# Patient Record
Sex: Female | Born: 1935 | Race: White | Hispanic: No | Smoking: Former smoker
Health system: Southern US, Community
[De-identification: ages and names within clinical notes are randomized; demographics above are authoritative.]

## PROBLEM LIST (undated history)

## (undated) DIAGNOSIS — M25519 Pain in unspecified shoulder: Secondary | ICD-10-CM

## (undated) DIAGNOSIS — Q438 Other specified congenital malformations of intestine: Secondary | ICD-10-CM

## (undated) DIAGNOSIS — C649 Malignant neoplasm of unspecified kidney, except renal pelvis: Secondary | ICD-10-CM

## (undated) DIAGNOSIS — Z951 Presence of aortocoronary bypass graft: Secondary | ICD-10-CM

## (undated) DIAGNOSIS — R197 Diarrhea, unspecified: Secondary | ICD-10-CM

## (undated) DIAGNOSIS — J449 Chronic obstructive pulmonary disease, unspecified: Secondary | ICD-10-CM

## (undated) DIAGNOSIS — R55 Syncope and collapse: Secondary | ICD-10-CM

## (undated) DIAGNOSIS — I2510 Atherosclerotic heart disease of native coronary artery without angina pectoris: Secondary | ICD-10-CM

## (undated) DIAGNOSIS — R9430 Abnormal result of cardiovascular function study, unspecified: Secondary | ICD-10-CM

## (undated) DIAGNOSIS — D589 Hereditary hemolytic anemia, unspecified: Secondary | ICD-10-CM

## (undated) DIAGNOSIS — I739 Peripheral vascular disease, unspecified: Secondary | ICD-10-CM

## (undated) DIAGNOSIS — K219 Gastro-esophageal reflux disease without esophagitis: Secondary | ICD-10-CM

## (undated) DIAGNOSIS — I340 Nonrheumatic mitral (valve) insufficiency: Secondary | ICD-10-CM

## (undated) DIAGNOSIS — IMO0002 Reserved for concepts with insufficient information to code with codable children: Secondary | ICD-10-CM

## (undated) DIAGNOSIS — Z79899 Other long term (current) drug therapy: Secondary | ICD-10-CM

## (undated) DIAGNOSIS — I779 Disorder of arteries and arterioles, unspecified: Secondary | ICD-10-CM

## (undated) DIAGNOSIS — Z87898 Personal history of other specified conditions: Secondary | ICD-10-CM

## (undated) DIAGNOSIS — I493 Ventricular premature depolarization: Secondary | ICD-10-CM

## (undated) DIAGNOSIS — N189 Chronic kidney disease, unspecified: Secondary | ICD-10-CM

## (undated) HISTORY — DX: Diarrhea, unspecified: R19.7

## (undated) HISTORY — DX: Nonrheumatic mitral (valve) insufficiency: I34.0

## (undated) HISTORY — DX: Peripheral vascular disease, unspecified: I73.9

## (undated) HISTORY — DX: Chronic kidney disease, unspecified: N18.9

## (undated) HISTORY — DX: Syncope and collapse: R55

## (undated) HISTORY — DX: Gastro-esophageal reflux disease without esophagitis: K21.9

## (undated) HISTORY — DX: Atherosclerotic heart disease of native coronary artery without angina pectoris: I25.10

## (undated) HISTORY — DX: Malignant neoplasm of unspecified kidney, except renal pelvis: C64.9

## (undated) HISTORY — DX: Presence of aortocoronary bypass graft: Z95.1

## (undated) HISTORY — DX: Hereditary hemolytic anemia, unspecified: D58.9

## (undated) HISTORY — PX: RECTAL BIOPSY: SHX2303

## (undated) HISTORY — PX: CORONARY ARTERY BYPASS GRAFT: SHX141

## (undated) HISTORY — PX: SHOULDER SURGERY: SHX246

## (undated) HISTORY — PX: KIDNEY SURGERY: SHX687

## (undated) HISTORY — DX: Abnormal result of cardiovascular function study, unspecified: R94.30

## (undated) HISTORY — DX: Personal history of other specified conditions: Z87.898

## (undated) HISTORY — DX: Other long term (current) drug therapy: Z79.899

## (undated) HISTORY — DX: Ventricular premature depolarization: I49.3

## (undated) HISTORY — PX: ABDOMINAL HYSTERECTOMY: SHX81

## (undated) HISTORY — DX: Reserved for concepts with insufficient information to code with codable children: IMO0002

## (undated) HISTORY — PX: GALLBLADDER SURGERY: SHX652

## (undated) HISTORY — DX: Chronic obstructive pulmonary disease, unspecified: J44.9

## (undated) HISTORY — DX: Pain in unspecified shoulder: M25.519

## (undated) HISTORY — PX: OTHER SURGICAL HISTORY: SHX169

## (undated) HISTORY — DX: Disorder of arteries and arterioles, unspecified: I77.9

---

## 2001-01-20 ENCOUNTER — Encounter: Payer: Self-pay | Admitting: *Deleted

## 2001-01-21 ENCOUNTER — Inpatient Hospital Stay (HOSPITAL_COMMUNITY): Admission: RE | Admit: 2001-01-21 | Discharge: 2001-01-29 | Payer: Self-pay | Admitting: *Deleted

## 2001-01-21 ENCOUNTER — Encounter: Payer: Self-pay | Admitting: Thoracic Surgery (Cardiothoracic Vascular Surgery)

## 2001-01-22 ENCOUNTER — Encounter: Payer: Self-pay | Admitting: Thoracic Surgery (Cardiothoracic Vascular Surgery)

## 2001-01-23 ENCOUNTER — Encounter: Payer: Self-pay | Admitting: Thoracic Surgery (Cardiothoracic Vascular Surgery)

## 2001-01-24 ENCOUNTER — Encounter: Payer: Self-pay | Admitting: Thoracic Surgery (Cardiothoracic Vascular Surgery)

## 2005-09-09 ENCOUNTER — Ambulatory Visit: Payer: Self-pay | Admitting: Cardiology

## 2005-12-08 ENCOUNTER — Ambulatory Visit: Payer: Self-pay

## 2006-11-13 ENCOUNTER — Ambulatory Visit: Payer: Self-pay | Admitting: Cardiology

## 2006-12-07 ENCOUNTER — Ambulatory Visit: Payer: Self-pay | Admitting: Cardiology

## 2007-01-04 ENCOUNTER — Inpatient Hospital Stay (HOSPITAL_COMMUNITY): Admission: EM | Admit: 2007-01-04 | Discharge: 2007-01-12 | Payer: Self-pay | Admitting: Emergency Medicine

## 2007-01-04 ENCOUNTER — Ambulatory Visit: Payer: Self-pay | Admitting: Internal Medicine

## 2007-01-12 ENCOUNTER — Encounter: Payer: Self-pay | Admitting: Internal Medicine

## 2007-03-30 ENCOUNTER — Ambulatory Visit: Payer: Self-pay | Admitting: Cardiology

## 2007-08-17 HISTORY — PX: CARDIAC CATHETERIZATION: SHX172

## 2007-11-21 ENCOUNTER — Encounter: Admission: RE | Admit: 2007-11-21 | Discharge: 2007-11-21 | Payer: Self-pay | Admitting: Gastroenterology

## 2008-01-09 ENCOUNTER — Ambulatory Visit: Payer: Self-pay | Admitting: Cardiology

## 2008-06-28 ENCOUNTER — Ambulatory Visit: Payer: Self-pay | Admitting: Cardiology

## 2008-06-28 ENCOUNTER — Inpatient Hospital Stay (HOSPITAL_COMMUNITY): Admission: AD | Admit: 2008-06-28 | Discharge: 2008-07-02 | Payer: Self-pay | Admitting: Internal Medicine

## 2008-06-28 ENCOUNTER — Encounter: Payer: Self-pay | Admitting: Cardiology

## 2008-07-21 ENCOUNTER — Inpatient Hospital Stay (HOSPITAL_COMMUNITY): Admission: EM | Admit: 2008-07-21 | Discharge: 2008-07-23 | Payer: Self-pay | Admitting: Emergency Medicine

## 2008-07-21 ENCOUNTER — Ambulatory Visit: Payer: Self-pay | Admitting: Internal Medicine

## 2008-07-26 ENCOUNTER — Encounter: Payer: Self-pay | Admitting: Cardiology

## 2008-07-29 ENCOUNTER — Encounter: Payer: Self-pay | Admitting: Cardiology

## 2008-08-22 ENCOUNTER — Encounter: Payer: Self-pay | Admitting: Cardiology

## 2008-08-23 ENCOUNTER — Ambulatory Visit: Payer: Self-pay | Admitting: Cardiology

## 2008-08-27 ENCOUNTER — Ambulatory Visit: Payer: Self-pay | Admitting: Cardiology

## 2008-08-27 ENCOUNTER — Encounter: Payer: Self-pay | Admitting: Cardiology

## 2008-08-28 ENCOUNTER — Encounter: Payer: Self-pay | Admitting: Cardiology

## 2008-11-20 ENCOUNTER — Encounter: Payer: Self-pay | Admitting: Cardiology

## 2008-12-10 ENCOUNTER — Encounter: Payer: Self-pay | Admitting: Cardiology

## 2009-01-22 ENCOUNTER — Encounter: Payer: Self-pay | Admitting: Cardiology

## 2009-01-22 ENCOUNTER — Ambulatory Visit (HOSPITAL_COMMUNITY): Admission: RE | Admit: 2009-01-22 | Discharge: 2009-01-22 | Payer: Self-pay | Admitting: Urology

## 2009-01-28 ENCOUNTER — Ambulatory Visit: Payer: Self-pay | Admitting: Cardiology

## 2009-02-03 ENCOUNTER — Encounter: Payer: Self-pay | Admitting: Cardiology

## 2009-02-07 ENCOUNTER — Ambulatory Visit (HOSPITAL_COMMUNITY): Admission: RE | Admit: 2009-02-07 | Discharge: 2009-02-07 | Payer: Self-pay | Admitting: Urology

## 2009-02-07 ENCOUNTER — Encounter (INDEPENDENT_AMBULATORY_CARE_PROVIDER_SITE_OTHER): Payer: Self-pay | Admitting: Interventional Radiology

## 2009-03-31 ENCOUNTER — Inpatient Hospital Stay (HOSPITAL_COMMUNITY): Admission: RE | Admit: 2009-03-31 | Discharge: 2009-04-04 | Payer: Self-pay | Admitting: Urology

## 2009-10-02 ENCOUNTER — Encounter: Payer: Self-pay | Admitting: Cardiology

## 2009-12-12 ENCOUNTER — Encounter: Payer: Self-pay | Admitting: Cardiology

## 2010-01-01 ENCOUNTER — Encounter: Payer: Self-pay | Admitting: Cardiology

## 2010-01-06 ENCOUNTER — Encounter: Payer: Self-pay | Admitting: Cardiology

## 2010-01-06 DIAGNOSIS — I6529 Occlusion and stenosis of unspecified carotid artery: Secondary | ICD-10-CM

## 2010-01-06 DIAGNOSIS — K219 Gastro-esophageal reflux disease without esophagitis: Secondary | ICD-10-CM

## 2010-01-07 ENCOUNTER — Ambulatory Visit: Payer: Self-pay | Admitting: Cardiology

## 2010-01-16 ENCOUNTER — Encounter: Payer: Self-pay | Admitting: Cardiology

## 2010-01-19 ENCOUNTER — Ambulatory Visit: Payer: Self-pay | Admitting: Cardiology

## 2010-02-06 ENCOUNTER — Encounter: Payer: Self-pay | Admitting: Cardiology

## 2010-03-09 ENCOUNTER — Encounter: Payer: Self-pay | Admitting: Cardiology

## 2010-03-26 ENCOUNTER — Encounter: Payer: Self-pay | Admitting: Cardiology

## 2010-03-31 ENCOUNTER — Ambulatory Visit (HOSPITAL_COMMUNITY): Admission: RE | Admit: 2010-03-31 | Discharge: 2010-03-31 | Payer: Self-pay | Admitting: Urology

## 2010-05-19 ENCOUNTER — Ambulatory Visit: Payer: Self-pay | Admitting: Cardiology

## 2010-09-15 NOTE — Assessment & Plan Note (Signed)
Summary: 2 WK F/U PER 5/25 OV-JM   Visit Type:  Follow-up Primary Provider:  Weldon Picking Shah,MD  CC:  CAD.  History of Present Illness: The patient is seen for cardiology followup.  I saw her Jan 07, 2010.  Medicine doses were decreased because of syncope and the possibility of some orthostasis and volume depletion.  She's done better.  Her meds were further adjusted by Dr.Shah  recently with decrease of her metoprolol dose.  He has some mild dizziness today but overall is actually quite stable.  Preventive Screening-Counseling & Management  Alcohol-Tobacco     Smoking Status: quit     Year Quit: 1999  Current Medications (verified): 1)  Potassium Chloride Crys Cr 20 Meq Cr-Tabs (Potassium Chloride Crys Cr) .... Take 1 Tablet By Mouth Two Times A Day 2)  Prednisone 5 Mg Tabs (Prednisone) .... Take 1 Tablet By Mouth Once A Day 3)  Simvastatin 40 Mg Tabs (Simvastatin) .... Take 1 Tab By Mouth At Bedtime 4)  Isosorbide Mononitrate Cr 60 Mg Xr24h-Tab (Isosorbide Mononitrate) .... Take One Tablet By Mouth Daily Hold 5)  Metoprolol Tartrate 50 Mg Tabs (Metoprolol Tartrate) .... Take 1/2 Tablet By Mouth Daily Per Dr. Manuella Ghazi 6)  Alendronate Sodium 70 Mg Tabs (Alendronate Sodium) .... Take One By Mouth Weekly 7)  Prilosec Otc 20 Mg Tbec (Omeprazole Magnesium) .... Take 1 Tablet By Mouth Once A Day 8)  Furosemide 40 Mg Tabs (Furosemide) .... Take 1 Tablet By Mouth Every Morning and 1/2 Tablets By Mouth Every Evening 9)  Sertraline Hcl 50 Mg Tabs (Sertraline Hcl) .... Take 1 Tablet By Mouth Once A Day 10)  Allopurinol 100 Mg Tabs (Allopurinol) .... Take 1 Tablet By Mouth Once A Day 11)  Aspir-Low 81 Mg Tbec (Aspirin) .... Take 1 Tablet By Mouth Once A Day 12)  Drisdol 50000 Unit Caps (Ergocalciferol) .... Take One By Mouth Weekly 13)  Nitrostat 0.4 Mg Subl (Nitroglycerin) .... Use As Directed 14)  Spiriva Handihaler 18 Mcg Caps (Tiotropium Bromide Monohydrate) .... One Inhalation Daily  Allergies  (verified): 1)  ! Codeine  Comments:  Nurse/Medical Assistant: The patient's medication list and allergies were reviewed with the patient and were updated in the Medication and Allergy Lists.   Past History:  Past Medical History: COPD   home O2 Hemolytic anemia    Dr.Darovsky... probable Coombs negative hemolytic anemia... some prednisone therapy Prednisone therapy.... intermittent for anemia Osteoporosis Gout. PVD (ICD-443.9)  legs... mild GERD Arthritis PVCs Renal cell mass... followed over time Pneumonia.... severe was shocked at 2008 Diarrhea.... recurrent Clostridium difficile in the past   Dr.Magod Prolonged QT interval intermittent Left shoulder pain... chronic rotator cuff... Mitral regurgitation.... mild CAD   catheterization November, 2009...Marland Kitchen 2 of 4 grafts patent... normal ejection fraction Ejection fraction... normal... catheterization.... 2009 CABG   2002 Syncope.... January, 2010.Marland Kitchen.. probable orthostatic hypotension and volume /  [facility].. Dec 18, 2009 normal sinus rhythm.. Carotid artery disease... Doppler... January, 2010 mild plaque, no major stenosis... vertebral flow antegrade    Review of Systems       Patient denies fever, chills, headache, sweats, rash, change in vision, change in hearing, chest pain, cough, nausea vomiting, urinary symptoms.  All of the systems are reviewed and are negative other than the history of present illness  Vital Signs:  Patient profile:   76 year old female Height:      63 inches Weight:      113 pounds Pulse rate:   71 / minute BP sitting:  160 / 73  (left arm) Cuff size:   regular  Vitals Entered By: Georgina Peer (January 19, 2010 9:40 AM)  Serial Vital Signs/Assessments:  Time      Position  BP       Pulse  Resp  Temp     By 9:41 AM   R Arm     147/71                         Georgina Peer  Comments: 9:41 AM HR-66 By: Georgina Peer    Physical Exam  General:  patient is quite stable today. Eyes:  no  xanthelasma. Neck:  soft left carotid bruit. Lungs:  lungs are clear.  Respiratory effort is nonlabored. Heart:  cardiac exam reveals S1-S2.  No clicks or significant murmurs Abdomen:  abdomen is soft. Extremities:  no peripheral edema. Psych:  patient is oriented to person time and place.  Affect is normal.   Impression & Recommendations:  Problem # 1:  CAROTID ARTERY DISEASE (ICD-433.10)  Her updated medication list for this problem includes:    Aspir-low 81 Mg Tbec (Aspirin) .Marland Kitchen... Take 1 tablet by mouth once a day We know she has mild disease.  Followup Dopplers not needed at this time.  Problem # 2:  SYNCOPE (ICD-780.2)  Her updated medication list for this problem includes:    Prednisone 5 Mg Tabs (Prednisone) .Marland Kitchen... Take 1 tablet by mouth once a day    Isosorbide Mononitrate Cr 60 Mg Xr24h-tab (Isosorbide mononitrate) .Marland Kitchen... Take one tablet by mouth daily hold    Metoprolol Tartrate 50 Mg Tabs (Metoprolol tartrate) .Marland Kitchen... Take 1/2 tablet by mouth daily per dr. Manuella Ghazi    Aspir-low 81 Mg Tbec (Aspirin) .Marland Kitchen... Take 1 tablet by mouth once a day    Nitrostat 0.4 Mg Subl (Nitroglycerin) ..... Use as directed Patient has had no recurrent syncope.  Keeping her meds reduced he is appropriate.  Her systolic pressure is slightly elevated today.  Diastolic is 73.  I feel we should not adjust her medicines. I had obtained a copy of the report of the patient's 21 day monitor.  The data is recorded in the past medical history.  She had sinus rhythm.  I reviewed that report.  Problem # 3:  CAD (ICD-414.00)  Her updated medication list for this problem includes:    Isosorbide Mononitrate Cr 60 Mg Xr24h-tab (Isosorbide mononitrate) .Marland Kitchen... Take one tablet by mouth daily hold    Metoprolol Tartrate 50 Mg Tabs (Metoprolol tartrate) .Marland Kitchen... Take 1/2 tablet by mouth daily per dr. Manuella Ghazi    Aspir-low 81 Mg Tbec (Aspirin) .Marland Kitchen... Take 1 tablet by mouth once a day    Nitrostat 0.4 Mg Subl (Nitroglycerin) ..... Use  as directed Coronary disease is stable.  No change in therapy.  6 month followup.  Patient Instructions: 1)  Your physician wants you to follow-up in: 6 months. You will receive a reminder letter in the mail one-two months in advance. If you don't receive a letter, please call our office to schedule the follow-up appointment. 2)  Your physician recommends that you continue on your current medications as directed. Please refer to the Current Medication list given to you today.

## 2010-09-15 NOTE — Letter (Signed)
Summary: External Correspondence/ OFFICE VISIT [facility]  External Correspondence/ OFFICE VISIT [facility]   Imported By: Bartholomew Boards 03/30/2010 16:28:22  _____________________________________________________________________  External Attachment:    Type:   Image     Comment:   External Document

## 2010-09-15 NOTE — Miscellaneous (Signed)
  Clinical Lists Changes  Observations: Added new observation of PAST MED HX: COPD   home O2 Hemolytic anemia    Dr.Darovsky... probable Coombs negative hemolytic anemia... some prednisone therapy Prednisone therapy.... intermittent for anemia Osteoporosis Gout. PVD (ICD-443.9)  legs... mild GERD Arthritis PVCs Renal cell mass... followed over time Pneumonia.... severe was shocked at 2008 Diarrhea.... recurrent Clostridium difficile in the past   Dr.Magod Prolonged QT interval intermittent Left shoulder pain... chronic rotator cuff... Mitral regurgitation.... mild CAD   catheterization November, 2009...Marland Kitchen 2 of 4 grafts patent... normal ejection fraction Ejection fraction... normal... catheterization.... 2009 CABG   2002 Syncope.... January, 2010.Marland Kitchen.. probable orthostatic hypotension and volume /  [facility].. Dec 18, 2009 normal sinus rhythm Carotid artery disease... Doppler... January, 2010 mild plaque, no major stenosis... vertebral flow antegrade   (01/16/2010 17:48) Added new observation of PRIMARY MD: Ashish Shah,MD (01/16/2010 17:48)       Past History:  Past Medical History: COPD   home O2 Hemolytic anemia    Dr.Darovsky... probable Coombs negative hemolytic anemia... some prednisone therapy Prednisone therapy.... intermittent for anemia Osteoporosis Gout. PVD (ICD-443.9)  legs... mild GERD Arthritis PVCs Renal cell mass... followed over time Pneumonia.... severe was shocked at 2008 Diarrhea.... recurrent Clostridium difficile in the past   Dr.Magod Prolonged QT interval intermittent Left shoulder pain... chronic rotator cuff... Mitral regurgitation.... mild CAD   catheterization November, 2009...Marland Kitchen 2 of 4 grafts patent... normal ejection fraction Ejection fraction... normal... catheterization.... 2009 CABG   2002 Syncope.... January, 2010.Marland Kitchen.. probable orthostatic hypotension and volume /  [facility].. Dec 18, 2009 normal sinus rhythm Carotid artery disease...  Doppler... January, 2010 mild plaque, no major stenosis... vertebral flow antegrade

## 2010-09-15 NOTE — Medication Information (Signed)
Summary: RX Folder/ [facility]  RX Folder/ [facility]   Imported By: Bartholomew Boards 03/30/2010 16:38:24  _____________________________________________________________________  External Attachment:    Type:   Image     Comment:   External Document

## 2010-09-15 NOTE — Letter (Signed)
Summary: External Correspondence/ OFFICE VISIT [facility]  External Correspondence/ OFFICE VISIT [facility]   Imported By: Bartholomew Boards 03/30/2010 16:29:35  _____________________________________________________________________  External Attachment:    Type:   Image     Comment:   External Document

## 2010-09-15 NOTE — Assessment & Plan Note (Signed)
Summary: ROV-DR. [facility] REQUESTED APPT DUE   Visit Type:  Follow-up Primary Provider:  Weldon Picking Shah,MD  CC:  syncope.  History of Present Illness: The patient is seen for followup of syncope.  She is doing well.  Her metoprolol dose was decreased by Dr.Shah, and she is doing very well with this.  It seems that she did have an orthostatic component to her blood pressure.  Today she is not having any chest pain or dizziness.  Preventive Screening-Counseling & Management  Alcohol-Tobacco     Smoking Status: quit     Year Quit: 1999  Current Medications (verified): 1)  Potassium Chloride Crys Cr 20 Meq Cr-Tabs (Potassium Chloride Crys Cr) .... Take 1 Tablet By Mouth Two Times A Day 2)  Prednisone 5 Mg Tabs (Prednisone) .... Take 1 Tablet By Mouth Once A Day 3)  Simvastatin 40 Mg Tabs (Simvastatin) .... Take 1 Tab By Mouth At Bedtime 4)  Metoprolol Tartrate 25 Mg Tabs (Metoprolol Tartrate) .... Take 1/2 Tablet By Mouth Once A Day 5)  Prilosec Otc 20 Mg Tbec (Omeprazole Magnesium) .... Take 1 Tablet By Mouth Once A Day 6)  Furosemide 40 Mg Tabs (Furosemide) .... Take 1 Tablet By Mouth Every Morning and 1/2 Tablets By Mouth Every Evening 7)  Sertraline Hcl 50 Mg Tabs (Sertraline Hcl) .... Take 1 Tablet By Mouth Once A Day 8)  Aspir-Low 81 Mg Tbec (Aspirin) .... Take 1 Tablet By Mouth Every Other Day 9)  Nitrostat 0.4 Mg Subl (Nitroglycerin) .... Use As Directed 10)  Spiriva Handihaler 18 Mcg Caps (Tiotropium Bromide Monohydrate) .... One Inhalation Daily 11)  Tart Cherry Advanced  Caps (Misc Natural Products) .... Take 1 Tablet By Mouth Once A Day  Allergies (verified): 1)  ! Codeine  Comments:  Nurse/Medical Assistant: The patient's medication list and allergies were reviewed with the patient and were updated in the Medication and Allergy Lists.  Past History:  Past Medical History: COPD   home O2 Hemolytic anemia    Dr.Darovsky... probable Coombs negative hemolytic anemia... some  prednisone therapy Prednisone therapy.... intermittent for anemia Osteoporosis Gout. PVD (ICD-443.9)  legs... mild GERD Arthritis PVCs Renal cell mass... followed over time Pneumonia.... severe was shocked at 2008 Diarrhea.... recurrent Clostridium difficile in the past   Dr.Magod Prolonged QT interval intermittent Left shoulder pain... chronic rotator cuff... Mitral regurgitation.... mild CAD   catheterization November, 2009...Marland Kitchen 2 of 4 grafts patent... normal ejection fraction Ejection fraction... normal... catheterization.... 2009 CABG   2002 Syncope.... January, 2010.Marland Kitchen.. probable orthostatic hypotension and volume /  [facility].. Dec 18, 2009 normal sinus rhythm.. Carotid artery disease... Doppler... January, 2010 mild plaque, no major stenosis... vertebral flow antegrade    Review of Systems       Patient denies fever, chills, headache, sweats, rash, change in vision, change in hearing, chest pain, cough, nausea vomiting, urinary symptoms.  All other systems are reviewed and are negative.  Vital Signs:  Patient profile:   76 year old female Height:      63 inches Weight:      113 pounds Pulse rate:   73 / minute BP sitting:   126 / 72  (left arm) Cuff size:   regular  Vitals Entered By: Georgina Peer (May 19, 2010 9:24 AM) CC: syncope   Physical Exam  General:  patient looks good today. Eyes:  no xanthelasma. Neck:  no jugular venous distention. Lungs:  lungs are clear.  Respiratory effort is nonlabored. Heart:  cardiac exam reveals S1-S2.  There is no clicks or significant murmurs. Abdomen:  abdomen is soft. Extremities:  no peripheral edema. Psych:  patient is oriented to person time and place.  Affect is normal.   Impression & Recommendations:  Problem # 1:  CAROTID ARTERY DISEASE (ICD-433.10)  Her updated medication list for this problem includes:    Aspir-low 81 Mg Tbec (Aspirin) .Marland Kitchen... Take 1 tablet by mouth every other day She has mild carotid  disease.  No further workup.  Problem # 2:  SYNCOPE (ICD-780.2)  The following medications were removed from the medication list:    Isosorbide Mononitrate Cr 60 Mg Xr24h-tab (Isosorbide mononitrate) .Marland Kitchen... Take one tablet by mouth daily hold Her updated medication list for this problem includes:    Prednisone 5 Mg Tabs (Prednisone) .Marland Kitchen... Take 1 tablet by mouth once a day    Metoprolol Tartrate 25 Mg Tabs (Metoprolol tartrate) .Marland Kitchen... Take 1/2 tablet by mouth once a day    Aspir-low 81 Mg Tbec (Aspirin) .Marland Kitchen... Take 1 tablet by mouth every other day    Nitrostat 0.4 Mg Subl (Nitroglycerin) ..... Use as directed There has been no syncope.  Her metoprolol dose has been lower than she is stable with this.  Problem # 3:  CAD (ICD-414.00)  The following medications were removed from the medication list:    Isosorbide Mononitrate Cr 60 Mg Xr24h-tab (Isosorbide mononitrate) .Marland Kitchen... Take one tablet by mouth daily hold Her updated medication list for this problem includes:    Metoprolol Tartrate 25 Mg Tabs (Metoprolol tartrate) .Marland Kitchen... Take 1/2 tablet by mouth once a day    Aspir-low 81 Mg Tbec (Aspirin) .Marland Kitchen... Take 1 tablet by mouth every other day    Nitrostat 0.4 Mg Subl (Nitroglycerin) ..... Use as directed Coronary disease is stable.  No further workup.  Patient Instructions: 1)  Your physician wants you to follow-up in: 6 months. You will receive a reminder letter in the mail one-two months in advance. If you don't receive a letter, please call our office to schedule the follow-up appointment. 2)  Your physician recommends that you continue on your current medications as directed. Please refer to the Current Medication list given to you today.

## 2010-09-15 NOTE — Assessment & Plan Note (Signed)
Summary: 6 month fu -recvv letter vs   Visit Type:  Follow-up Primary Provider:  Weldon Picking Shah,MD  CC:  dizziness.  History of Present Illness: Patient is seen for cardiology followup.  I saw her last June, 2010.  She had an episode of syncope in January, 2010.  We thought that this was probably related to orthostasis and mild volume depletion.  She has not had any recurrent syncope.  Her blood pressure is low at home at times and she feels poorly with this.  She is more a monitor through Dr.Shah's office.  I do not have the result yet.  She's not having any significant chest pain.  She is on furosemide 40 b.i.d.  She says that she has some swelling and she does not take this dose.  She is on low-dose prednisone.  Preventive Screening-Counseling & Management  Alcohol-Tobacco     Smoking Status: quit     Year Quit: 1999  Current Medications (verified): 1)  Potassium Chloride Crys Cr 20 Meq Cr-Tabs (Potassium Chloride Crys Cr) .... Take 1 Tablet By Mouth Two Times A Day 2)  Prednisone 5 Mg Tabs (Prednisone) .... Take 1 Tablet By Mouth Once A Day 3)  Simvastatin 40 Mg Tabs (Simvastatin) .... Take 1 Tab By Mouth At Bedtime 4)  Isosorbide Mononitrate Cr 60 Mg Xr24h-Tab (Isosorbide Mononitrate) .... Take One Tablet By Mouth Daily 5)  Metoprolol Tartrate 50 Mg Tabs (Metoprolol Tartrate) .... Take 1/2 Tablet By Mouth Two Times A Day 6)  Alendronate Sodium 70 Mg Tabs (Alendronate Sodium) .... Take One By Mouth Weekly 7)  Prilosec Otc 20 Mg Tbec (Omeprazole Magnesium) .... Take 1 Tablet By Mouth Once A Day 8)  Furosemide 40 Mg Tabs (Furosemide) .... Take 1 Tablet By Mouth Two Times A Day 9)  Sertraline Hcl 50 Mg Tabs (Sertraline Hcl) .... Take 1 Tablet By Mouth Once A Day 10)  Allopurinol 100 Mg Tabs (Allopurinol) .... Take 1 Tablet By Mouth Once A Day 11)  Aspir-Low 81 Mg Tbec (Aspirin) .... Take 1 Tablet By Mouth Once A Day 12)  Drisdol 50000 Unit Caps (Ergocalciferol) .... Take One By Mouth  Weekly 13)  Nitrostat 0.4 Mg Subl (Nitroglycerin) .... Use As Directed 14)  Spiriva Handihaler 18 Mcg Caps (Tiotropium Bromide Monohydrate) .... One Inhalation Daily  Allergies (verified): 1)  ! Codeine  Comments:  Nurse/Medical Assistant: The patient's medications and allergies were reviewed with the patient and were updated in the Medication and Allergy Lists. List reviewed.  Past History:  Past Medical History: Last updated: 01/06/2010 COPD   home O2 Hemolytic anemia    Dr.Darovsky... probable Coombs negative hemolytic anemia... some prednisone therapy Prednisone therapy.... intermittent for anemia Osteoporosis Gout. PVD (ICD-443.9)  legs... mild GERD Arthritis PVCs Renal cell mass... followed over time Pneumonia.... severe was shocked at 2008 Diarrhea.... recurrent Clostridium difficile in the past   Dr.Magod Prolonged QT interval intermittent Left shoulder pain... chronic rotator cuff... Mitral regurgitation.... mild CAD   catheterization November, 2009...Marland Kitchen 2 of 4 grafts patent... normal ejection fraction Ejection fraction... normal... catheterization.... 2009 CABG   2002 Syncope.... January, 2010.Marland Kitchen.. probable orthostatic hypotension and volume Carotid artery disease... Doppler... January, 2010 mild plaque, no major stenosis... vertebral flow antegrade    Review of Systems       Patient denies fever, chills, headache, sweats, rash, change in vision, change in hearing, chest pain, cough, shortness of breath, nausea or vomiting, urinary symptoms, musculoskeletal problems.  All other systems are reviewed and are negative.  Vital Signs:  Patient profile:   76 year old female Height:      63 inches Weight:      111 pounds BMI:     19.73 Pulse rate:   68 / minute BP sitting:   122 / 67  (left arm) Cuff size:   regular  Vitals Entered By: Georgina Peer (Jan 07, 2010 12:23 PM) CC: dizziness   Physical Exam  General:  patient is quite stable today. Head:  head  is atraumatic. Eyes:  no xanthelasma. Neck:  no jugular distention. Chest Wall:  no chest wall tenderness. Lungs:  lungs are clear.  Respiratory effort is nonlabored. Heart:  cardiac exam reveals S1-S2.  No clicks or significant murmurs. Abdomen:  abdomen is soft. Msk:  no musculoskeletal deformities. Extremities:  no peripheral edema Skin:  nose in rashes.  She does have a few areas of ecchymoses on her arms. Psych:  patient is oriented to person time and place.  Affect is normal.  She is here with her husband.   Impression & Recommendations:  Problem # 1:  CAROTID ARTERY DISEASE (ICD-433.10)  Her updated medication list for this problem includes:    Aspir-low 81 Mg Tbec (Aspirin) .Marland Kitchen... Take 1 tablet by mouth once a day The patient has mild carotid artery disease.  We know she had only mild plaque in January, 2010.  Followup study is not needed very  Problem # 2:  SYNCOPE (ICD-780.2) The patient had syncope in the past.  It was felt to be volume and orthostasis.  She is now having intermittent problems with low blood pressure.  She feels poorly with this but has not had syncope.  EKG is done and reviewed by me.  The tracing is normal.  There is normal sinus rhythm.  I've chosen to decrease her Lasix dose slightly to 40 in the morning and 20 in the evening.  I put her isosorbide on hold.  I will see her back for early followup.  I will obtain monitor that she wore through Dr.Shah's office.  Problem # 3:  MITRAL REGURGITATION (ICD-396.3) Mitral regurgitation has been mild in the past.  She does not need followup echo.  Problem # 4:  * PREDNISONE THERAPY Patient is on low-dose prednisone.  Problem # 5:  * HEMOLYTIC ANEMIA The patient tells me that her anemia is under control.  No further workup by me at this time.  Problem # 6:  CAD (ICD-414.00) Coronary disease is stable.  I do not believe that her dizziness is related to ischemia.  Catheterization was done in 2009 showing a two over  four grafts are patent.  I will see her for early followup.  I will decide over time if exercise testing needs to be done.  Other Orders: EKG w/ Interpretation (93000)  Patient Instructions: 1)  Hold Imdur (isosorbide) for now. 2)  Decrease Lasix (furosemide) to 40mg  every morning and 1/2 tablet (20mg ) every evening. 3)  RETURN FOR OFFICE VISIT WITH DR. Salena Ortlieb ON [address] AT 9:45AM

## 2010-09-15 NOTE — Procedures (Signed)
Summary: [facility]) Final Summary Dr. Daphene [facility]) Final Summary Dr. Manuella Ghazi   Imported By: Gurney Maxin, RN, BSN 01/07/2010 15:15:17  _____________________________________________________________________  External Attachment:    Type:   Image     Comment:   External Document

## 2010-09-15 NOTE — Miscellaneous (Signed)
  Clinical Lists Changes  Problems: Added new problem of CAD (ICD-414.00) Removed problem of CAD, ARTERY BYPASS GRAFT (ICD-414.04) Added new problem of CORONARY ARTERY BYPASS GRAFT, HX OF (ICD-V45.81) Added new problem of * HEMOLYTIC ANEMIA Added new problem of * PREDNISONE THERAPY Added new problem of PVD (ICD-443.9) Added new problem of GERD (ICD-530.81) Added new problem of * PVCS Added new problem of * RENAL CELL MASS Added new problem of * PROLONGED QT Added new problem of MITRAL REGURGITATION (ICD-396.3) Added new problem of SYNCOPE (ICD-780.2) Added new problem of CAROTID ARTERY DISEASE (ICD-433.10) Observations: Added new observation of PAST MED HX: COPD   home O2 Hemolytic anemia    Dr.Darovsky... probable Coombs negative hemolytic anemia... some prednisone therapy Prednisone therapy.... intermittent for anemia Osteoporosis Gout. PVD (ICD-443.9)  legs... mild GERD Arthritis PVCs Renal cell mass... followed over time Pneumonia.... severe was shocked at 2008 Diarrhea.... recurrent Clostridium difficile in the past   Dr.Magod Prolonged QT interval intermittent Left shoulder pain... chronic rotator cuff... Mitral regurgitation.... mild CAD   catheterization November, 2009...Marland Kitchen 2 of 4 grafts patent... normal ejection fraction Ejection fraction... normal... catheterization.... 2009 CABG   2002 Syncope.... January, 2010.Marland Kitchen.. probable orthostatic hypotension and volume Carotid artery disease... Doppler... January, 2010 mild plaque, no major stenosis... vertebral flow antegrade   (01/06/2010 13:37) Added new observation of PRIMARY MD: Manuella Ghazi (01/06/2010 13:37)       Past History:  Past Medical History: COPD   home O2 Hemolytic anemia    Dr.Darovsky... probable Coombs negative hemolytic anemia... some prednisone therapy Prednisone therapy.... intermittent for anemia Osteoporosis Gout. PVD (ICD-443.9)  legs... mild GERD Arthritis PVCs Renal cell mass... followed  over time Pneumonia.... severe was shocked at 2008 Diarrhea.... recurrent Clostridium difficile in the past   Dr.Magod Prolonged QT interval intermittent Left shoulder pain... chronic rotator cuff... Mitral regurgitation.... mild CAD   catheterization November, 2009...Marland Kitchen 2 of 4 grafts patent... normal ejection fraction Ejection fraction... normal... catheterization.... 2009 CABG   2002 Syncope.... January, 2010.Marland Kitchen.. probable orthostatic hypotension and volume Carotid artery disease... Doppler... January, 2010 mild plaque, no major stenosis... vertebral flow antegrade

## 2010-09-15 NOTE — Letter (Signed)
Summary: External Correspondence/ OFFICE VISIT [facility]  External Correspondence/ OFFICE VISIT [facility]   Imported By: Bartholomew Boards 03/30/2010 16:30:51  _____________________________________________________________________  External Attachment:    Type:   Image     Comment:   External Document

## 2010-09-15 NOTE — Letter (Signed)
Summary: External Correspondence/ OFFICE VISIT [facility]  External Correspondence/ OFFICE VISIT [facility]   Imported By: Bartholomew Boards 03/30/2010 16:33:34  _____________________________________________________________________  External Attachment:    Type:   Image     Comment:   External Document

## 2010-10-01 ENCOUNTER — Other Ambulatory Visit (HOSPITAL_COMMUNITY): Payer: Self-pay | Admitting: Urology

## 2010-10-01 ENCOUNTER — Ambulatory Visit (HOSPITAL_COMMUNITY)
Admission: RE | Admit: 2010-10-01 | Discharge: 2010-10-01 | Disposition: A | Discharge status: Home / Self Care | Payer: Medicare Other | Source: Ambulatory Visit | Attending: Urology | Admitting: Urology

## 2010-10-01 DIAGNOSIS — I10 Essential (primary) hypertension: Secondary | ICD-10-CM | POA: Insufficient documentation

## 2010-10-01 DIAGNOSIS — Z01818 Encounter for other preprocedural examination: Secondary | ICD-10-CM | POA: Insufficient documentation

## 2010-10-01 DIAGNOSIS — C649 Malignant neoplasm of unspecified kidney, except renal pelvis: Secondary | ICD-10-CM | POA: Insufficient documentation

## 2010-11-16 ENCOUNTER — Other Ambulatory Visit: Payer: Self-pay | Admitting: *Deleted

## 2010-11-16 ENCOUNTER — Other Ambulatory Visit: Payer: Self-pay | Admitting: Cardiology

## 2010-11-16 NOTE — Telephone Encounter (Signed)
Refill done already by [facility] office

## 2010-11-21 LAB — BASIC METABOLIC PANEL
BUN: 11 mg/dL (ref 6–23)
CO2: 24 mEq/L (ref 19–32)
CO2: 27 mEq/L (ref 19–32)
CO2: 27 mEq/L (ref 19–32)
Calcium: 8 mg/dL — ABNORMAL LOW (ref 8.4–10.5)
Calcium: 8.3 mg/dL — ABNORMAL LOW (ref 8.4–10.5)
Calcium: 9.6 mg/dL (ref 8.4–10.5)
Chloride: 100 mEq/L (ref 96–112)
Chloride: 98 mEq/L (ref 96–112)
Creatinine, Ser: 1.19 mg/dL (ref 0.4–1.2)
Creatinine, Ser: 1.27 mg/dL — ABNORMAL HIGH (ref 0.4–1.2)
Creatinine, Ser: 1.58 mg/dL — ABNORMAL HIGH (ref 0.4–1.2)
Creatinine, Ser: 1.64 mg/dL — ABNORMAL HIGH (ref 0.4–1.2)
GFR calc Af Amer: 37 mL/min — ABNORMAL LOW (ref >60)
GFR calc Af Amer: 39 mL/min — ABNORMAL LOW (ref >60)
GFR calc non Af Amer: 45 mL/min — ABNORMAL LOW (ref >60)
GFR calc non Af Amer: 52 mL/min — ABNORMAL LOW (ref >60)
Glucose, Bld: 118 mg/dL — ABNORMAL HIGH (ref 70–99)
Glucose, Bld: 143 mg/dL — ABNORMAL HIGH (ref 70–99)
Potassium: 4.6 mEq/L (ref 3.5–5.1)
Potassium: 4.6 mEq/L (ref 3.5–5.1)
Sodium: 136 mEq/L (ref 135–145)
Sodium: 141 mEq/L (ref 135–145)

## 2010-11-21 LAB — HEMOGLOBIN AND HEMATOCRIT, BLOOD
HCT: 38 % (ref 36.0–46.0)
Hemoglobin: 10.3 g/dL — ABNORMAL LOW (ref 12.0–15.0)
Hemoglobin: 13 g/dL (ref 12.0–15.0)

## 2010-11-21 LAB — TYPE AND SCREEN: Antibody Screen: POSITIVE

## 2010-11-21 LAB — DIFFERENTIAL
Basophils Relative: 2 % — ABNORMAL HIGH (ref 0–1)
Eosinophils Absolute: 0.4 10*3/uL (ref 0.0–0.7)
Eosinophils Relative: 5 % (ref 0–5)
Monocytes Absolute: 0.6 10*3/uL (ref 0.1–1.0)
Monocytes Relative: 8 % (ref 3–12)
Neutrophils Relative %: 61 % (ref 43–77)

## 2010-11-21 LAB — CBC
HCT: 29.5 % — ABNORMAL LOW (ref 36.0–46.0)
HCT: 38.5 % (ref 36.0–46.0)
Hemoglobin: 10.1 g/dL — ABNORMAL LOW (ref 12.0–15.0)
Hemoglobin: 12.9 g/dL (ref 12.0–15.0)
MCHC: 34.3 g/dL (ref 30.0–36.0)
MCHC: 34.8 g/dL (ref 30.0–36.0)
MCV: 92.3 fL (ref 78.0–100.0)
Platelets: 224 10*3/uL (ref 150–400)
Platelets: 232 10*3/uL (ref 150–400)
RBC: 3.2 MIL/uL — ABNORMAL LOW (ref 3.87–5.11)
RDW: 12.9 % (ref 11.5–15.5)
WBC: 8.5 10*3/uL (ref 4.0–10.5)

## 2010-11-23 LAB — CBC
HCT: 38.3 % (ref 36.0–46.0)
Platelets: 232 10*3/uL (ref 150–400)
RDW: 14 % (ref 11.5–15.5)
WBC: 6.2 10*3/uL (ref 4.0–10.5)

## 2010-11-23 LAB — PROTIME-INR: INR: 0.9 (ref 0.00–1.49)

## 2010-11-23 LAB — APTT: aPTT: 27 seconds (ref 24–37)

## 2010-12-29 NOTE — Assessment & Plan Note (Signed)
[place] OFFICE NOTE   NAME:[name], [name] MICHELS                      MRN:          [id number]  DATE:01/09/2008                            DOB:          Sep 04, 1934    [name] is seen for cardiology followup.  She continues to look well.  When I had seen her last in August 2008, she was status post several  difficult medical problems.  She had had a life-threatening pneumonia  with septic shock and requiring ventilation in April 2008.  We know her  ejection fraction is 50-55%.  She had slight troponin elevation when she  had severe hypotension.  Ultimately she stabilized.  She had a  Cardiolite scan.  There was question of a very slight ischemia and I  thought that this was not a high-risk scan.  She also had recurrent C.  Difficile and had to be hospitalized at [facility] and this continues to be  managed by Dr. Watt Climes.  She has rectal prolapse.  She has seen Dr. Lennie Hummer and she is going to have surgery by Dr. Malachi Carl in [place] in June.  She is here partly for followup and partly to finalize  her cardiac clearance for this.   PAST MEDICAL HISTORY:   ALLERGIES:  No known drug allergies.   MEDICATIONS:  K-Dur 20 b.i.d., prednisone 5, Prilosec, furosemide 20,  Zoloft, colchicine, aspirin, vancomycin small dose daily, Actonel,  metoprolol, and home O2 at night only.   OTHER MEDICAL PROBLEMS:  See the complete list below.   REVIEW OF SYSTEMS:  Despite her problems, she does well.  She continues  to have problems with her rectal prolapse and hopefully this will be  completely taken care of by her surgery in June.  Otherwise her review  of systems is negative.   PHYSICAL EXAM:  Blood pressure is 120/61 with a pulse of 66.  Weight is  109.  The patient is oriented to person, time and place.  Affect is normal.  She is here with her husband today.  HEENT:  Reveals no xanthelasma.  She has normal  extraocular motion.  There are no carotid bruits.  There is no jugular venous distention.  Lungs are clear.  Respiratory effort is not labored.  Cardiac exam reveals S1-S2.  There are no clicks or significant murmurs.  The abdomen is soft.  She has no significant peripheral edema.   No labs were done today.   PROBLEMS:  1. Coronary disease post CABG in 2002 and she is stable.  2. History of some congestive heart failure in the past but she has      had none recently.  3. History of ejection fraction in the 40-50% range.  4. Significant COPD with home O2 at night.  5. History of remote tobacco.  6. History of hemolytic anemia.  Dr. Jacquiline Doe at the cancer center in      [place] has felt that she should remain on a low-dose prednisone.  The      issue of prednisone dosing needs to be carefully  considered at that      time of her surgery as she may need some stress dosing.  She has a      history of a hemolytic anemia that is most likely Coombs-negative      and this was diagnosed in 1974.  7. Ongoing prednisone therapy see the paragraph above.  8. History of mild peripheral vascular disease in her legs.  9. Next history of gastroesophageal reflux disease.  10.Next arthritis.  11.Next history of suprapubic fracture in the past.  12.Next Premature ventricular contractions that have been noticed over      time.  13.Next history of a renal cell mass in the past.  14.History of severe pneumonia with septic shock requiring ventilation      in early 2008.  With her significant lung disease, she needs very      careful attention to pulmonary issues with her surgery.  This      should not preclude her surgery but needs to be carefully kept in      mind.  15.Next history of recurrent C. Difficile and she continues on very      low-dose vancomycin as outlined by Dr. Clarene Essex in [place].  16.Next history of gout.  17.Next history at times of a prolonged QT interval.  18.Next  osteoporosis.  19.Next chronic left shoulder pain from a rotator cuff injury.  20.Status post cholecystectomy for gallstones.  21.Next mild mitral regurgitation.   Her overall cardiac status is stable.  She can be cleared for her rectal  surgery to occur next month.  Very careful attention needs to be paid to  her pulmonary status.  I will see her back for cardiology followup in 6  months.     Carlena Bjornstad, MD, [facility]  Electronically Signed    JDK/MedQ  DD: 01/09/2008  DT: 01/09/2008  Job #: [id number]   cc:   Lew Dawes. Rosana Hoes, M.D.  Jeryl Columbia, M.D.  Monico Blitz, MD  Dr. Malachi Carl

## 2010-12-29 NOTE — Discharge Summary (Signed)
[name], [name] NO.:  [id number]   MEDICAL RECORD NO.:  [id number]          PATIENT TYPE:  INP   LOCATION:  2923                         FACILITY:  [facility]   PHYSICIAN:  Carlena Bjornstad, MD, FACCDATE OF BIRTH:  06-19-1935   DATE OF ADMISSION:  07/21/2008  DATE OF DISCHARGE:  07/23/2008                               DISCHARGE SUMMARY   PRIMARY CARDIOLOGIST:  Carlena Bjornstad, MD, [facility]   PRIMARY CARE PHYSICIAN:  Monico Blitz, MD   DISCHARGE DIAGNOSIS:  Unstable angina.   SECONDARY DIAGNOSIS:  1. Coronary artery disease status post coronary artery bypass graft x4      in 2002.  2. Chronic obstructive pulmonary disease with remote tobacco abuse, on      home O2 at night.  3. Mild peripheral arterial disease.  4. Chronic kidney disease.  5. Gastroesophageal reflux disease.  6. Mild mitral regurgitation.  7. Gout.  8. History of prolonged QT interval.  9. Status post cholecystectomy.  10.History of recurrent Clostridium difficile.   ALLERGIES:  The patient is allergic to CODEINE.   PROCEDURES:  The patient had electrocardiogram performed on July 21, 2008, that was in normal sinus rhythm and showed no significant ST  changes from an EKG that was performed on June 29, 2008.   HISTORY OF PRESENT ILLNESS:  This is a 76 year old white female with a  history of coronary artery disease status post coronary artery bypass  graft x4 with the recent cath in November 2009 for angina who presented  today with 3 episodes of chest pain.  She states that the first episode  started the morning of July 21, 2008, while she was at Sunday school  and was described as a chest pressure associated with diaphoresis,  shortness of breath, and nausea.  It lasted for approximately 15 minutes  and relieved with nitroglycerin.  Her chest pain was not associated with  exertion and was followed by 2 more episodes that were progressively  worse and similar in nature and  severity.  She was at [facility] during the  third episode in the afternoon of July 21, 2008, and emergency  services were contacted.  She was brought to the [facility] as  she is a patient of Dr. Ron Parker.  Otherwise, she states that she has been  doing well prior to these episodes.  She is tolerating her medications  well including the new addition of Imdur 30 mg for the last month.  She  has no exertional angina and describes no syncope, presyncope,  palpitations, increased lower extremity edema, paroxysmal nocturnal  dyspnea, or orthopnea prior to this admission.   HOSPITAL COURSE:  The patient was admitted on July 21, 2008, and  ruled out for MI with 3 sets of negative cardiac enzymes.  She was  monitored on telemetry with no significant EKG changes during the course  of her hospital stay and no recurrent chest pain since July 21, 2008.  Given her recent evidence of nonobstructive coronary artery disease in  November 2009, her medications were slightly alerted and she will be  medically monitored at this time and follow up with Dr. Ron Parker in his  office at [facility] in January 2010.  The patient will begin cardiac rehab as  soon as she is able to do so at this time.  There is no reason to assume  that this can start immediately upon discharge from the hospital.   DISCHARGE LABS:  White blood cell count was 5.9, hemoglobin was 11.5,  and hematocrit was 33.9.  Sodium was 138, potassium was 4.3, chloride  101, CO2 of 27, BUN 18, creatinine 1.18, and glucose 102.  Total  bilirubin was 0.8, alk phos was 82, AST was 54, ALT 23, calcium was 9.2,  magnesium was 2.2, and blood albumin was 3.8.  The last set of cardiac  enzymes; creatinine kinase was 46, CK-MB was 0.9, troponin was 0.02,  total cholesterol was 114, triglycerides 55, HDL 43, LDL 58, and VLDL  13.  Protime was 13.2, INR 1.0, and APTT 31.   The patient has a followup appointment in [facility] with Dr. Ron Parker on August 23, 2008 at  2:15 p.m.   DISCHARGE MEDICATIONS:  1. The patient will be taking Imdur 60 mg by mouth daily.  2. Aspirin 81 mg by mouth daily.  3. Metoprolol 50 mg by mouth twice a day.  4. Simvastatin 40 mg daily.  5. Lasix 40 mg by mouth daily.  6. Potassium chloride 20 mEq by mouth twice a day.  7. Colchicine 0.6 mg daily.  8. Prednisone 5 mg daily.  9. Prilosec 20 mg daily.  10.Actonel 100 mg once a month.  11.Nitroglycerin 0.4 mg sublingually as needed for chest pain.  12.Sertraline 50 mg by mouth daily.   There are no outstanding labs or studies at this time.  The duration of  the discharge encounter including physician time was 50 minutes.      Guss Bunde, [facility]      Carlena Bjornstad, MD, [facility]  Electronically Signed    MS/MEDQ  D:  07/23/2008  T:  07/24/2008  Job:  [id number]   cc:   Monico Blitz, MD

## 2010-12-29 NOTE — Assessment & Plan Note (Signed)
[place] OFFICE NOTE   NAME:Cullimore, JEILANI PALM                      MRN:          [id number]  DATE:01/28/2009                            DOB:          Oct 09, 1934    Ms. [name] is seen for cardiology follow-up.  I had seen her last on  August 23, 2008, and she appeared to be stable.  Unfortunately, she had  a syncopal spell several days later.  We thought that it was due to  orthostatic hypotension.  Diuretics were held.  There may have been a  volume component.  She stabilized.  She did have carotid Dopplers on  January 13th.  The study showed that there was mild plaque of the  proximal internal carotid arteries.  There was no major stenoses.  Vertebral flow was antegrade.  Since that time, she has been stable.   The patient tells me that she is seeing Dr. Jeffie Pollock and other members of  his group, following through on some type of abnormality that will need  to be biopsied and evaluated further.  Her cardiac status is stable for  this.  She does have significant coronary disease.  She is post CABG.  Catheterization was done last in November 2009.  She had 2 of 4 grafts  patent.  Ejection fraction was normal.  Medical therapy was recommended.  It was felt that if she were to have more symptoms over time ultrasound  of the left main would be appropriate.  She has not had significant  symptoms and she has been stable.   PAST MEDICAL HISTORY:  Allergies:  No known drug allergies.   Medications:  Lasix, metoprolol, sertraline, prednisone, isosorbide,  colchicine, simvastatin, Prevacid, aspirin, Spiriva, potassium and  risedronate weekly.   Other Medical Problems:  See the complete list below.   REVIEW OF SYSTEMS:  The patient is not having any fevers or chills.  She  has no skin rashes.  There is no headache.  There is no change in her  vision or her hearing.  There is no shortness of breath.  There is no  cough  or chest pain.  She has no GI or GU symptoms.  There are no major  musculoskeletal symptoms.  All other systems are reviewed and are  negative.   PHYSICAL EXAMINATION:  Blood pressure is 110/55 and her heart rate is  76.  Weight is 114 pounds.  The patient is oriented to person, time and  place.  Affect is normal.  HEENT:  Reveals no xanthelasma.  She has normal extraocular motion.  There are no carotid bruits.  There is no jugular venous distention.  LUNGS:  Clear.  Respiratory effort is not labored.  CARDIAC:  Reveals an S1 with an S2.  There are no clicks or significant  murmurs.  ABDOMEN:  Soft.  She has no significant peripheral edema.  There are no  musculoskeletal deformities.  The patient is here with her husband today.   PROBLEMS:  1. Coronary disease post coronary artery bypass graft in 2002 with 2/4  grafts occluded.  She is not having chest pain at this time.      Further workup is not needed.  2. History of congestive heart failure in the past but none recently.  3. History of ejection fraction 40% to 50%.  4. Significant chronic obstructive pulmonary disease, on home oxygen.  5. History of remote tobacco.  6. History of hemolytic anemia, followed by Dr. Jacquiline Doe in the [place].      The patient is on low-dose prednisone.  It is most likely that she      has a Coombs-negative hemolytic anemia.  7. Ongoing prednisone therapy as outlined above.  8. Mild peripheral vascular disease of the legs that is followed.  9. History of gastroesophageal reflux disease.  10.Arthritis.  11.History of a suprapubic fracture in the past.  12.Premature ventricular contractions.  13.History of a renal cell mass in the past that is being followed      over time now and may be the issue of the need for biopsy in the      near future.  14.History of a severe pneumonia with septic shock in 2008.  She has      significant lung disease.  However, she is quite stable at this      time.   15.History of recurrent Clostridium difficile diarrhea in the past      that has stabilized.  She is followed by Dr. Watt Climes.  16.Gout.  17.History of a prolonged QT interval that is intermittent.  18.Osteoporosis.  19.Chronic left shoulder pain from rotator cuff injury.  20.Status post cholecystectomy and gallstones.  21.History of mild mitral regurgitation.   All of the issues appear to be stable.  I have reviewed all of her  records carefully including her hospitalization data.  At this point she  is cleared to have the urologic procedure needed.  We will be happy to  help at any point, if needed.     Carlena Bjornstad, MD, [facility]  Electronically Signed    JDK/MedQ  DD: 01/28/2009  DT: 01/28/2009  Job #: [id number]   cc:   Lew Dawes. Rosana Hoes, M.D.  Jeryl Columbia, M.D.  Monico Blitz, MD  Malachi Carl, Dr.  Marshall Cork. Jeffie Pollock, M.D.

## 2010-12-29 NOTE — Assessment & Plan Note (Signed)
[place] OFFICE NOTE   NAME:[name], [name]                      MRN:          [id number]  DATE:03/30/2007                            DOB:          July 01, 1935    [name] is seen for cardiology followup.  She actually quite good  at this time.  In April, she was at [facility] with a severe  pneumonia and septic shock.  She was on a ventilator.  At that time, her  ejection fraction was 50-55%.  She was quite ill during that  hospitalization.  She did have some troponin elevation at around the  time of hypotension with that admission.  She stabilized.  Ultimately,  she underwent a Cardiolite scan on December 12, 2006.  With that study, she  had possibility of some mild ischemia in the anterior wall.  This was a  very mild finding, and I thought it was not a high risk scan.  Since  that time, she has had other illnesses.  She was hospitalized at [facility] in June 2008.  She had recurrent C. difficile colitis and was  dehydrated, and eventually she stabilized.  She is still followed by Dr.  Watt Climes for this.  She also has some rectal prolapse, and there is  question of needing surgery for this.  She is seeing Dr. Lennie Hummer for  this problem.   PAST MEDICAL HISTORY:   ALLERGIES:  No known drug allergies.   MEDICATIONS:  1. Vinomycin (oral vancomycin).  The dose is regulated by Dr. Watt Climes.  2. Potassium 20 b.i.d.  3. Actonel.  4. Prednisone 5 mg daily.  5. Prilosec 20.  6. Metoprolol 12.5.  7. Furosemide 20.  8. Zoloft 50.  9. Colchicine 0.6.  10.Advair 250/50.  11.Aspirin 81 mg.   OTHER MEDICAL PROBLEMS:  See the complete list below.   REVIEW OF SYSTEMS:  Remarkably despite her multiple problems, she has no  complaints today, and she looks quite good.  Her review of systems is  negative.   PHYSICAL EXAMINATION:  The patient weighs 110 pounds.  Blood pressure is  110/51 with a pulse of 70.   The patient is oriented to person, time, and  place.  Affect is normal.  She is here with her husband.  HEENT:  No xanthelasma.  She has normal extraocular motion.  LUNGS:  Distant lung sounds.  She has no respiratory distress.  CARDIAC:  An S1 with an S2.  There are no clicks or significant murmurs.  ABDOMEN:  Soft.  She has normal bowel sounds.  She has no significant  peripheral edema.   EKG today shows sinus rhythm with a short PR interval and no significant  EKG changes.   Problems include:  1. Coronary artery disease post-CABG in 2002 at [facility].  2. History of congestive heart failure in the past but none clinically      recently.  3. History of mild left ventricular dysfunction with an ejection      fraction ranging in the 40-50% range.  4. Chronic obstructive  pulmonary disease with home O2.  5. History of remote tobacco.  6. Hemolytic anemia.  She is followed by Dr. Jacquiline Doe at [facility] in [place].  She has a hemolytic anemia that is most likely      Coombs-negative diagnosed in 1974, and she is on maintenance      prednisone.  7. Ongoing low dose prednisone.  8. Mild peripheral vascular disease in her legs.  9. History of gastroesophageal reflux disease.  10.Arthritis.  11.History of a suprapubic fracture.  12.Premature ventricular contractions over time.  13.History of a renal cell mass in the past.  14.History of a severe pneumonia with septic shock requiring      ventilator dependence.  Because of this abnormality, very, very      careful attention will have to be given to the patient undergoing      further surgery, but this can be done if handled carefully through      pulmonary and anesthesia.  15.Status post recurrent C. difficile colitis.  She is on continued      vancomycin.  16.History of gout.  17.History of a QT that is prolonged at times.  18.Osteoporosis.  19.Chronic left shoulder pain from rotator cuff injury.  20.Status post  cholecystectomy for gallstones.  21.Ejection fraction 50-55% by echocardiogram in April 2008.  22.Mild mitral regurgitation.   [name] cardiac status is stable.  I will not be changing any of her  cardiac medications.  If she requires further surgical procedures, these  can be undertaken from a cardiac viewpoint.  There will have to be very  careful attention to the type of anesthesia and to her pulmonary status.     Carlena Bjornstad, MD, [facility]  Electronically Signed   JDK/MedQ  DD: 03/30/2007  DT: 03/31/2007  Job #: [id number]   cc:   Lew Dawes. Rosana Hoes, M.D.  Jeryl Columbia, M.D.  Monico Blitz, MD

## 2010-12-29 NOTE — Cardiovascular Report (Signed)
[name], [name] NO.:  [id number]   MEDICAL RECORD NO.:  [id number]         PATIENT TYPE:  CINP   LOCATION:                               FACILITY:  [facility]   PHYSICIAN:  Lauree Chandler, MDDATE OF BIRTH:  Jun 05, 1935   DATE OF PROCEDURE:  07/01/2008  DATE OF DISCHARGE:                            CARDIAC CATHETERIZATION   PRIMARY CARDIOLOGIST:  Carlena Bjornstad, MD, [facility]   PROCEDURES PERFORMED:  1. Left heart catheterization.  2. Selective coronary angiography.  3. Saphenous vein graft angiography.  4. Attempted angiography of the left internal mammary artery graft.  5. Left ventricular angiogram.   OPERATOR:  Lauree Chandler, MD   INDICATION:  Chest pain in a 76 year old Caucasian female with a history  of triple-vessel coronary artery disease status post four-vessel CABG in  2002.   DETAILS OF PROCEDURE:  The patient was brought to the inpatient cardiac  catheterization laboratory after signing informed consent for the  procedure.  The right groin was prepped and draped in a sterile fashion.  A 5-French sheath was inserted into the right femoral artery without  difficulty.  1% lidocaine was used for local anesthesia.  A 5-French JL-  4 diagnostic catheter was used to selectively engage the left main  coronary artery.  Selective coronary angiography of the left coronary  system was then performed.  A JR-4 diagnostic catheter was used to  selectively engage the right coronary artery.  This was followed by a  selective angiogram of the right coronary system.  The JR-4 diagnostic  catheter was also used to selectively inject all 3 saphenous vein  grafts.  I attempted to engage the left internal mammary artery graft  with a JR-4 catheter and a LIMA catheter, but was unable to find the  takeoff of this graft which was felt to be totally occluded.  A 5-French  pigtail catheter was then used to cross the aortic valve into the left  ventricle.   Following the performance of the left ventricular angiogram,  pigtail catheter was pulled back across the aortic valve with no  significant pressure gradient measured.   ANGIOGRAPHIC FINDINGS:  1. The left main coronary artery gives rise to the circumflex and LAD.      There is a 40-50% ostial stenosis of the left main coronary artery.      There is a 30% distal stenosis in the body of the left main      coronary artery.  2. The left anterior descending has a 60% proximal stenosis and a 60%      mid stenosis.  This is followed by a 100% occlusion of the mid LAD.      There are several large septal perforators that give collaterals to      the right coronary artery.  The mid and distal LAD fill from      collaterals from the posterior descending artery.  3. The circumflex artery is a small vessel that has a long tubular 80%      stenosis in the proximal portion and is followed by a 100%  occlusion in the midportion.  The first obtuse marginal branch      fills from saphenous vein graft.  4. The right coronary artery has a functional 100% occlusion in the      very proximal portion.  The mid and distal right coronary artery      fill from the saphenous vein graft and also from septal      collaterals.  5. Saphenous vein graft to the first obtuse marginal coronary artery      is patent.  6. The saphenous vein graft to the PDA is patent.  7. The saphenous vein graft to the mid LAD is occluded at the aortic      anastomosis.  8. The left internal mammary artery graft to the diagonal is occluded      at its origin and subclavian artery.  9. Left ventricular angiogram shows normal systolic function with no      wall motion abnormalities.  Ejection fraction is estimated at 55%.   HEMODYNAMIC DATA:  Central aortic pressure 137/45, LV pressure 147/2,  and end-diastolic pressure 9.   IMPRESSION:  1. Triple native vessel coronary artery disease status post four-      vessel coronary  artery bypass graft in 2002 with 2/4 patent bypass      grafts.  2. Ostial left main stenosis.  3. Normal left ventricular function.   RECOMMENDATIONS:  At the current time, I recommend continued medical  management of this patient's coronary artery disease.  The most  concerning area in the angiograms is the ostial stenosis of the left  main coronary artery.  Both of the bypass grafts to the anterior  circulation are 100% occluded.  The mid and distal LAD get some filling  from the right coronary system.  This stenosis could potentially  compromise flow through the proximal and midportions of the LAD which  gives rise to large septal branches.  If the patient continues to have  lifestyle-limiting angina, it may be worthwhile to consider an IVUS of  the ostial left main with potential stenting.  This will be a high-risk  procedure as this left main is currently unprotected.  I will discuss  the case with the patient's primary cardiologist, Dr. Ron Parker, and will  make further plans.      Lauree Chandler, MD  Electronically Signed     CM/MEDQ  D:  07/01/2008  T:  07/02/2008  Job:  [id number]   cc:   Carlena Bjornstad, MD, [facility]

## 2010-12-29 NOTE — Assessment & Plan Note (Signed)
[place] OFFICE NOTE   NAME:[name], [name] HUBEL                      MRN:          [id number]  DATE:08/23/2008                            DOB:          1935/03/04    [name] is here for followup.  I had seen her last in May 2009.  At  that time, her problems are outlined extensively.  She was stable and I  gave her permission to have rectal surgery by Dr. Lennie Hummer.  This was  done and all the issues are resolved.  I do not know the exact  diagnosis.  The patient then was admitted to [facility] on June 28, 2008.  She had some chest discomfort.  She had been admitted to [facility]  and transferred to [facility].  While at [facility], she underwent cardiac  catheterization.  This study revealed that she had 2/4 grafts were  patent.  She had a patent vein graft to OM and a patent vein graft to  the PDA.  The vein graft to the LAD and a LIMA to the diagonal were  occluded.  The ejection fraction was 55%.  It was felt by Dr. Angelena Form  that medical therapy should be pushed.  However, she had continued  significant symptoms.  Consideration could be given to ultrasound of her  left main and a possible intervention.  She stabilized and went home.  The patient then was readmitted to [facility] on July 21, 2008.  Once  again, she had chest discomfort.  In the hospital, she was monitored.  She had no recurrent chest pain.  Her meds were altered and plans were  made for her to join a cardiac rehab.  This will in fact occur in the  near future here in [place].  During that hospitalization, there was no  echo done or needed.  Also at no time at that hospitalizations did she  have carotid Dopplers.  She is now here for followup.  Today, she is  feeling well.  She is not having recurrent chest pain.   PAST MEDICAL HISTORY:   ALLERGIES:  No known drug allergies.   MEDICATIONS:  See the flow sheet.  She is on metoprolol.  She is on  Imdur.  She is on aspirin.   OTHER MEDICAL PROBLEMS:  See the list below.   REVIEW OF SYSTEMS:  She is not having any GI or GU symptoms.  She has no  fevers or chills.  There is no headache.  There are no skin rashes.  Her  review of systems is negative.   PHYSICAL EXAMINATION:  VITAL SIGNS:  Weight is 110 pounds, blood  pressure is 112/62, heart rate is 64.  GENERAL:  The patient is oriented to person, time and place.  Affect is  normal.  She is here with her husband today.  HEENT:  No xanthelasma.  She has normal extraocular motion.  NECK:  There is a left carotid bruit.  This is soft.  LUNGS:  Clear.  Respiratory effort is not labored.  CARDIAC:  An S1 with  an S2.  There are no clicks or significant murmurs.  ABDOMEN:  Soft.  EXTREMITIES:  She has no significant peripheral edema.   Problems are listed extensively on my note of Jan 09, 2008.  #1.  Coronary disease post coronary artery bypass grafting in 2002.  See  the discussion above for the admission in November and December with  chest discomfort.  See Dr. Camillia Herter cath report of July 01, 2008.  The patient has 2/4 occluded grafts.  The most concerning area was the  ostial stenosis of the left main.  Both bypass grafts to the anterior  circulation are occluded.  The mid and distal left anterior descending  get some filling from the right coronary artery system.  The stenosis of  the left main could potentially compromise flow to the proximal and  midportions in the left anterior descending, which gives rise to large  septal branches.  If the patient has lifestyle limiting angina, we could  consider intracoronary vascular ultrasound of the ostial left main and  potential stenting.  This would be a high risk procedure with the left  main unprotected.  At this time, she is stable.  I will not be adjusting  her medicines.  #2.  History of some congestive heart failure in the past, but this is  stable.  #3.  History of  ejection fraction in the 40-50% range.  However, a cath  in November, the ejection fraction was 55%.   See the other remaining problems, which are substantial on the note of  Jan 09, 2008.  #22.  Left carotid bruit.  She does need carotid Dopplers to assess this  further.  I have reviewed extensively her [facility] records and her [facility]  records and her cath report and discussed this with the patient and her  husband.  The finding of the left carotid bruit is a new problem.  We  will assess her carotids and I will see her back for followup.     Carlena Bjornstad, MD, [facility]  Electronically Signed    JDK/MedQ  DD: 08/23/2008  DT: 08/24/2008  Job #: [id number]

## 2010-12-29 NOTE — H&P (Signed)
[name], TOPF               ACCOUNT NO.:  [id number]   MEDICAL RECORD NO.:  [id number]          PATIENT TYPE:  INP   LOCATION:  2923                         FACILITY:  [facility]   PHYSICIAN:  Johnney Ou, MD DATE OF BIRTH:  04/28/1935   DATE OF ADMISSION:  07/21/2008  DATE OF DISCHARGE:                              HISTORY & PHYSICAL   CARDIOLOGIST:  Carlena Bjornstad, MD, [facility].   PRIMARY CARE PHYSICIAN:  Dr. Brigitte Pulse.   CHIEF COMPLAINTS:  Chest pain.   HISTORY OF PRESENT ILLNESS:  This is 76 year old white female with a  history of coronary disease, status post coronary artery bypass graft x4  with a recent cath in November 2009 for angina who presents today with  three episodes of chest pain.  She states that the first episode started  this morning while she was at Sunday School and was described as chest  pressure  associated with diaphoresis, shortness of breath and nausea.  This lasted approximately 15 minutes and was relieved with  nitroglycerin.  Her chest pain was not associated with exertion and was  followed by two more episodes that were progressively worse and similar  in nature and severity.  She was at church during the third episode in  the afternoon and 911 was called.  She was transferred here as she is a  patient of [name], Dr. Ron Parker.  Otherwise she states that she  has been doing well before these episodes.  She has been tolerating her  medications well including the new addition of Imdur 30 mg for the last  month.  She has had no exertional angina and describes no syncope,  presyncope, palpitations, increased lower extremity edema, paroxysmal  nocturnal dyspnea, or orthopnea prior to admission.   PAST MEDICAL HISTORY:  1. Coronary artery bypass graft x4 in 2002 with 2 out of 4 grafts      patent on her cath July 01, 2008.  It showed a 50% left main      that does not appear to be flow-limiting with LIMA to a diagonal      occluded.   Saphenous vein graft to PDA is okay.  Saphenous vein      graft to her mid LAD occluded and saphenous vein graft with OM1 is      patent with a left ventricular ejection fraction of 55%.  2. COPD.  3. Peripheral vascular disease.  4. Chronic kidney disease  5. GERD.  6. Gout.   MEDICATIONS ON ADMISSION:  1. Imdur 30 mg daily.  2. Aspirin 81 mg daily.  3. Metoprolol 25 mg twice daily.  4. Zocor 40 mg daily.  5. Lasix 40 mg daily.  6. Potassium chloride 20 mg twice daily.  7. Colchicine 0.6 mg daily.  8. Prednisone 5 mg daily.  9. Spread Prilosec 20 mg daily.  10.Actonel 100 mg every month.  11.Nitroglycerin 0.4 mg sublingually as needed for chest pain.   REVIEW OF SYSTEMS:  All 14 systems were reviewed and were negative  except as mentioned in detail in HPI.   PHYSICAL EXAMINATION:  VITAL SIGNS:  Blood pressure is 150/65,  respiratory rate 16, pulses 69.  Oxygen saturation 90% on 2 liters nasal  cannula.  GENERAL:  She is a 76 year old white female appearing her stated age.  No acute distress.  HEENT:  Moist mucous membranes.  Pupils equal, round, react to light and  accommodation.  Anicteric sclera.  NECK:  No jugular venous distention.  No thyromegaly.  CARDIOVASCULAR:  Regular rate and rhythm.  No murmurs, rubs or gallops.  LUNGS:  Clear to auscultation bilaterally.  ABDOMEN:  Nontender, nondistended.  Positive bowel sounds.  No masses.  EXTREMITIES:  No clubbing, cyanosis, edema.  Pulses 2+ throughout.  NEUROLOGIC:  Alert and oriented x3.  Cranial nerves II-XII grossly  intact.  No focal neurologic deficit.  SKIN:  No rashes. Warm, dry and intact.  PSYCHIATRIC:  Mood and affect are appropriate.   LABORATORY DATA:  Chest x-ray is currently pending.  EKG shows a normal  sinus rhythm at rate of 70 beats per minute with a normal axis,  incomplete right bundle branch block with a QTC of 473 with no ischemic  changes.  Labs currently pending.   ASSESSMENT:  The patient is  a 76 year old with a history of coronary  disease and abnormal cardiac cath in November after  presenting with  symptoms here with continued chest pain concerning for unstable angina.  Unstable angina.  Will admit her for full set of cardiac enzymes and  start her on full-dose anticoagulation.  Will continue current  medications and keep her n.p.o. for a possible cardiac catheterization  with possible physiologic study of left main 50% as discussed during her  last admission.      Johnney Ou, MD  Electronically Signed     BHH/MEDQ  D:  07/21/2008  T:  07/22/2008  Job:  [id number]

## 2010-12-29 NOTE — Discharge Summary (Signed)
[name], [name] NO.:  [id number]   MEDICAL RECORD NO.:  [id number]          PATIENT TYPE:  INP   LOCATION:  4705                         FACILITY:  [facility]   PHYSICIAN:  [name] Bjornstad, MD, FACCDATE OF BIRTH:  1934/11/14   DATE OF ADMISSION:  06/28/2008  DATE OF DISCHARGE:  07/02/2008                               DISCHARGE SUMMARY   PRIMARY CARDIOLOGIST:  [name] Bjornstad, MD, [facility]   PRIMARY CARE [name]:  [name] Blitz, MD   DISCHARGE DIAGNOSIS:  Unstable angina.   SECONDARY DIAGNOSES:  1. Coronary artery disease status post coronary artery bypass graft x4      in 2002.  2. Chronic obstructive pulmonary disease with remote tobacco abuse on      home O2 at nightly.  3. Mild peripheral arterial disease.  4. Chronic kidney disease.  5. Gastroesophageal reflux disease.  6. Mild mitral regurgitation.  7. Gout.  8. History of prolonged QT interval.  9. Status post cholecystectomy.  10.History of recurrent Clostridium difficile.   ALLERGIES:  CODEINE.   PROCEDURES:  Left heart cardiac catheterization.   HISTORY OF PRESENT ILLNESS:  A 76 year old Caucasian female with prior  history of CAD status post CABG x4 in 2002 who was admitted to [facility] directly from Dr. Trena [name] office secondary to a 2-week history  of exertional chest discomfort.  At [facility], the patient was  seen by [facility] and decision was made to transfer to [facility] for further evaluation and catheterization.   HOSPITAL COURSE:  [name] underwent left heart cardiac catheterization  on July 01, 2008, revealing 2 of 4 patent grafts including a patent  vein graft to the obtuse marginal and vein graft to the PDA.  The vein  graft to the LAD and LIMA to the diagonal were occluded.  EF was 55%  without regional wall motion abnormalities.  The patient did have triple  native vessel coronary artery disease including a 50% ostial stenosis of  the left main  which was not felt to be flow limiting.  Dr. Angelena [name]  performed the catheterization, felt a trial medical therapy with long  acting nitrates would be appropriate.  However, if [name] continued  to have lifestyle limiting chest discomfort, we could then consider  intravascular ultrasound of the left main and possible intervention.   [name] has had no recurrent chest pain.  She has been ambulating the  hall without difficulty and will be discharged home today in good  condition.   DISCHARGE LABORATORY DATA:  Hemoglobin 10.9, hematocrit 30.7, WBC 4.6,  and platelets 185.  INR 1.1.  Sodium 141, potassium 4.5, chloride 107,  CO2 26, BUN 12, creatinine 1.03, glucose 86, and calcium 8.9.  Hemoglobin A1c 5.2.  Cardiac markers negative x2.  Total cholesterol  166, triglycerides 94, HDL 41, and LDL 106.  TSH 0.681.  Iron 74 and  TIBC 281.   DISPOSITION:  The patient will be discharged home today in good  condition.   FOLLOWUP PLANS AND APPOINTMENTS:  We have arranged for followup with Dr.  Ron [name]  in [place] on July 23, 2008, at 2:15 p.m.  She is asked to follow  up with [name] as previously scheduled.   DISCHARGE MEDICATIONS:  1. Imdur 30 mg daily.  2. Aspirin 81 mg daily.  3. Metoprolol 25 mg b.i.d.  4. Simvastatin 40 mg nightly.  5. Furosemide 40 mg daily.  6. Potassium chloride 20 mEq b.i.d.  7. Colchicine 0.6 mg daily.  8. Sertraline 50 mg daily.  9. Prednisone 5 mg daily.  10.Prilosec 20 mg daily.  11.Actonel 100 mg every month.  12.Nitroglycerin 0.4 mg sublingual p.r.n. chest pain.   OUTSTANDING LABORATORY STUDIES:  None.   DURATION OF DISCHARGE/ENCOUNTER:  Sixty minutes including physician  time.      [name], ANP      [name] Bjornstad, MD, [facility]  Electronically Signed    CB/MEDQ  D:  07/02/2008  T:  07/02/2008  Job:  [id number]   cc:   [name] Blitz, MD

## 2010-12-29 NOTE — Consult Note (Signed)
[name], [name]               ACCOUNT NO.:  [id number]   MEDICAL RECORD NO.:  [id number]          PATIENT TYPE:  INP   LOCATION:  2917                         FACILITY:  [facility]   PHYSICIAN:  Jeryl Columbia, M.D.    DATE OF BIRTH:  04/27/1935   DATE OF CONSULTATION:  01/06/2007  DATE OF DISCHARGE:                                 CONSULTATION   REASON FOR CONSULTATION:  Recurrent C difficile colitis as well as  guaiac positive stool and decreasing hemoglobin   HISTORY OF PRESENT ILLNESS:  This is a 76 year old female with recent  history of pneumonia and septic shock at the beginning of April as well  as a distant history of hemolytic anemia who presented to [facility] on  Jan 04, 2007 with recurrent nausea, vomiting, and diarrhea.  She states  that March 30, she went to [facility] and was diagnosed with  pneumonia as well as septic shock.  She recovered in a short period of  time at home and on April 22, she went back to [facility] and was  diagnosed with hypokalemia and C.  Difficile colitis.  On approximately  May 1, she was having upper abdominal pain, went to [facility]  and was diagnosed with cholelithiasis, had a cholecystectomy on May 9.  Went back to [facility], had nausea and diarrhea, was diagnosed  as positive C difficile colitis.  On May 21, she had recurrent symptoms.  Came to [facility] emergency department and was positive for C difficile  colitis once again.  She states that she has occasional blood loss from  rectal prolapse.  She describes her bowel movements as just two per day  now.  Several days ago when her diarrhea was bad, she was having 15-20  per day but that has much improved.  She states that she used to be  chronically constipated, but her bowel movements have changed to become  very irregular in the past 12 months.  Sometimes constipated. Sometimes  she has diarrhea.  She has a history of GERD for which she takes  Prilosec  and has relief of symptoms.  She is positive for left shoulder  pain, positive for left great toe pain and gout.   PAST MEDICAL HISTORY:  1. Hemolytic anemia approximately 30 years ago.  She is on her chronic      prednisone.  2. She reports that she has had coronary artery disease, congestive      heart failure with current 50% ejection fraction.  3. GERD.  4. Gout.  5. Osteoporosis.  6. Degenerative joint disease.  7. Rectal prolapse.   PAST SURGICAL HISTORY:  1. Bladder tack.  2. Hysterectomy.  3. Four-vessel coronary artery bypass graft.  4. She states that she had a colonoscopy approximately 3 or 4 years      ago by Dr. Anthony Sar in [place] and that it was normal.  There was no      follow-up required.   REVIEW OF SYSTEMS:  As per HPI.   FAMILY HISTORY:  Family history is significant for her mother, father  and brother who had ulcer disease.  Both her mother and her father had a  partial gastrectomy.  There is no colon cancer.  No irritable bowel  disease known.   SOCIAL HISTORY:  No drugs.  No tobacco.  No alcohol.  She lives at home  with her husband in [place].   CURRENT MEDICATIONS:  1. Lasix.  2. Potassium chloride 20 mEq twice a day.  3. Metoprolol.  4. Alprazolam.  5. Colchicine 0.6 b.i.d.  6. Hydrocodone.  7. Prednisone 5 mg daily.  8. Zoloft  9. One other medications that I cannot read.   ALLERGIES:  CODEINE.   PHYSICAL EXAMINATION:  GENERAL:  She is alert and oriented and in no  apparent distress.  She is hard of hearing.  CARDIOVASCULAR:  Regular rate and rhythm.  LUNGS:  Clear to auscultation anteriorly.  ABDOMEN:  Healing laparoscopic incisions.  Nontender, nondistended with  good bowel sounds.  She is guaiac-positive today.   LABORATORY DATA:  Hemoglobin has been interesting.  Day before yesterday  she had a hemoglobin of 8.8.  This morning she had a hemoglobin of 7.9.  This afternoon it is 8.7.  However, she has had no blood transfusions.   Hematocrit 25.5, white count 6.2, platelets 383,000.  Chem-7 shows  potassium 4.3.  LFTs are normal.  She is negative for Giardia, positive  for C. diff on May 21.   Dr. Clarene Essex has seen and examined the patient.   ASSESSMENT:  His assessment is that this is a 76 year old female with  multiple medical problems.  1. Questionable GI blood loss, considering her normal BUN.  Possibly      she was guaiac-positive due to her rectal prolapse.  2. Clostridium difficile is improving.  3. Rectal prolapse.  The patient has scheduled a surgical consult with      Dr. Rosana Hoes.  4. Coronary artery disease with congestive heart failure, care per      primary team.  5. Gastroesophageal reflux disease.  She is currently on Protonix in      the hospital.  6. Gout to be treated per primary team.  7. History of hemolytic anemia,  remaining on chronic      immunosuppressive therapy.  Is this necessary?  8. Degenerative joint disease and osteoporosis.   PLAN:  1. For the Clostridium difficile colitis.  Agree with current      treatment of oral vancomycin 250 mg q.i.d., Flagyl per  IV 500 mg      q.8h. as well as probiotics.  Because [name] has had recurrent      bouts of Clostridium difficile colitis, we would expect that as she      improves, the Flagyl will be discontinued. but the oral vancomycin      will be weaned very slowly over the course of 4-6 weeks.  2. With regard to the  possible GI blood loss, we will continue to      watch her labs as well as her bowel      movements.  If it continues to appear that she is having      substantial blood loss, will schedule her for endoscopic procedure.   Thank you very much for this consultation.      Melton Alar, PA    ______________________________  Jeryl Columbia, M.D.    MLY/MEDQ  D:  01/06/2007  T:  01/07/2007  Job:  [id number]   cc:   Jeryl Columbia,  M.D.  Burke Keels, M.D.

## 2010-12-29 NOTE — Discharge Summary (Signed)
[name], [name]               ACCOUNT NO.:  [id number]   MEDICAL RECORD NO.:  [id number]          PATIENT TYPE:  INP   LOCATION:  1430                         FACILITY[facility]  [facility]   PHYSICIAN:  Raynelle Bring, MD      DATE OF BIRTH:  [birth year]   DATE OF ADMISSION:  03/31/2009  DATE OF DISCHARGE:  04/04/2009                               DISCHARGE SUMMARY   ADMISSION DIAGNOSIS:  Renal cell carcinoma.   DISCHARGE DIAGNOSIS:  Renal cell carcinoma.   PROCEDURES:  1. Right laparoscopic cryoablation of renal tumor.  2. Laparoscopic ultrasound guidance for renal cryoablation.   HISTORY AND PHYSICAL:  For full details, please see admission history  and physical.  Briefly, [name] is a 76 year old female with multiple  comorbidities who was found to have an enlarging enhancing right renal  mass concerning for renal malignancy.  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  Her metastatic  evaluation was negative, and after a discussion regarding management  options for treatment, she elected to proceed with the above named  procedures.   HOSPITAL COURSE:  On March 31, 2009, she was taken to the operating  room where she underwent laparoscopic right cryoablation for renal tumor  and laparoscopic ultrasound guidance for renal cryoablation.  This  procedure was uncomplicated, and she tolerated it well without  difficulties.  Postoperatively, she was able to be transferred to a  regular hospital room where she was monitored overnight and found to be  hemodynamically stable.  Her postoperative hematocrit was 13.  She did  have a Foley catheter in place which was draining clear blood-tinged  urine without clots.  Her urine output was somewhat decreased.  Therefore, her hemoglobin was rechecked later that afternoon and also  found to be stable at 12.9.  On postoperative day #1, her hemoglobin  remained 12, and her urine output had dramatically  increased and had  cleared.  Therefore, on postoperative day #1, her Foley catheter was  removed.  On postoperative day #2, her hemoglobin did drop to 10.3,  although she maintained excellent urine output and was otherwise  hemodynamically stable.  Therefore, her hemoglobin was rechecked again  later in the afternoon of postoperative day #3 and found to be stable  from that morning at 10.6.  Prior to discharge, her hemoglobin was also  stable at 11.1.   She was started on a clear liquid diet on postoperative day #1 which she  tolerated without nausea or vomiting.  Her diet was gradually  transitioned to a regular diet, and later on the afternoon of  postoperative day #1, she started having nausea and vomiting.  The  nausea and vomiting continued into postoperative day #2.  Therefore, her  fluids were increased, and her oral intake was restricted.  Her bowel  sounds had also somewhat diminished.  Therefore, an acute abdominal  series of films were ordered later on the afternoon of postoperative day  #2.  They showed no clear evidence of bowel obstruction.  There were gas  distended loops of colon and small amount  of gas in the rectum.  Suppositories and enemas were given, and by postoperative day #3, she  was passing flatus, having bowel movement, and reported feeling better  with no further nausea or vomiting.  By the afternoon of postoperative  day #3, she continued to be without nausea and vomiting.  Therefore, she  was again placed on clear liquid diet and tolerated it without problems,  and by postoperative day #4, she had transitioned to regular meals which  she was tolerating at the time of discharge with no further nausea or  vomiting.   Her serum creatinine on postoperative day #1 was 1.27 and did increase  on postoperative day number #2 to 1.64.  Continued monitoring of her  creatinine did show that her kidney function was gradually returning to  normal as indicated on  postoperative day #3 at 1.58 and upon discharge  1.19.  On postoperative day #1, she did begin to ambulate after bedrest  for 24 hours which she did without difficulty.  She was also able to be  transitioned to oral pain medications.  On the afternoon of  postoperative day #2, her periumbilical incision site was found to be  separated.  There was no drainage or erythema.  There was no fascial  dehiscence.  This was packed with wet-to-dry dressings, and a home  health nurse will continue to follow her on outpatient basis for  dressing changes.  On postoperative day #4, she was tolerating her diet,  ambulating without difficulty, and her pain was well controlled.  She  was, therefore, able to be discharged home in excellent condition.   Because of her daily hydrocortisone use prior to surgery, she was given  100 mg IV preoperatively before her procedure.  Over the next 24 hours,  she was given hydrocortisone 50 mg IV and then titrated down to 25 mg  IV.  On postoperative day #2, she was again restarted on her prednisone  5 mg daily.   DISPOSITION:  Home.   DISCHARGE MEDICATIONS:  She was instructed to resume her regular home  medications consisting of Spiriva, metoprolol, furosemide, isosorbide  mononitrate ER, Prilosec, colchicine, sertraline, prednisone, potassium.  She was instructed to resume her aspirin in 7 days.  In addition, she  was provided a prescription for Darvocet-N to use as needed for pain and  told to use Colace as a stool softener.   DISCHARGE INSTRUCTIONS:  She was instructed to be ambulatory but  specifically told to refrain from any heavy lifting, strenuous activity  or driving.  She was instructed to resume a regular diet.  She will also  have home health come out daily for periumbilical dressing changes.   FOLLOWUP:  She will return in 10-14 days for further postoperative  evaluation.      [name], [name]      Raynelle Bring, MD  Electronically  Signed    MA/MEDQ  D:  04/04/2009  T:  04/04/2009  Job:  [id number]

## 2010-12-29 NOTE — Op Note (Signed)
[name], [name]               ACCOUNT NO.:  [id number]   MEDICAL RECORD NO.:  [id number]          PATIENT TYPE:  INP   LOCATION:  0002                         [facility]AC[facility]   PHYSICIAN:  Raynelle Bring, MD      DATE OF BIRTH:  1934-11-21   DATE OF PROCEDURE:  03/31/2009  DATE OF DISCHARGE:                               OPERATIVE REPORT   PREOPERATIVE DIAGNOSIS:  Renal cell carcinoma.   POSTOPERATIVE DIAGNOSIS:  Renal cell carcinoma.   PROCEDURE:  1. Right laparoscopic cryoablation of renal tumor.  2. Laparoscopic ultrasound guidance for renal cryoablation.   SURGEON:  Dr. Raynelle Bring.   ASSISTANT:  Franco Collet, nurse practitioner.   ANESTHESIA:  General.   COMPLICATIONS:  Small capsular tear of the liver controlled with Floseal  for hemostasis.   ESTIMATED BLOOD LOSS:  50 mL.   INDICATIONS:  [name] is a 76 year old female with multiple  comorbidities who was found to have an enlarging enhancing right renal  mass concerning for renal malignancy.  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  Her metastatic  evaluation was negative and after a discussion regarding management  options for treatment, she elected to proceed with the above procedure.  The potential risks, complications, and alternative treatment options  were discussed in detail and informed consent was obtained.   DESCRIPTION OF PROCEDURE:  The patient was taken to the operating room  and a general anesthetic was administered.  She was given preoperative  antibiotics, placed into the right modified flank position, and prepped  and draped in the usual sterile fashion.  She had been administered  preoperative IV steroids due to her history of chronic steroid use and a  preoperative time-out was performed.  A site was selected just superior  to the umbilicus in the midline for placement of a camera port which was  placed using a standard open Hasson  technique.  This allowed entry into  the peritoneal cavity under direct vision without difficulty.  Zero  Vicryl fascial holding sutures were then placed and an 11-mm Hasson  cannula was positioned and secured with the fascial sutures.  A  pneumoperitoneum was established.  Using the 30 degrees lens, the  abdomen was inspected and there was no evidence for any intra-abdominal  injuries or other abnormalities.  The remaining ports were then placed  with a 5-mm port placed in the right upper quadrant and a 12-mm port  placed in the right lower quadrant.  These ports were placed under  direct vision without difficulty.  Utilizing the Harmonic scalpel, the  colon was then reflected medially allowing the space between the  mesocolon and anterior layer of Gerota's fascia to be developed.  The  patient did have a large overlying hepatic segment which was held  superiorly.  This was done with care as the patient was noted to have a  large hepatic hemangioma.  It was decided that liver retraction would be  necessary.  Therefore, an additional 5-mm port was placed in the right  upper quadrant and a Prestige grasper  was used to hold the peritoneum in  front of the liver, thereby keeping the liver retracted.  The superior  aspect of the kidney was then able to be mobilized with the Harmonic  scalpel, thereby freeing the upper pole of the kidney and allowing it to  be mobilized inferiorly.  The lateral attachments were also taken down  with the Harmonic scalpel and this allowed the kidney to be flipped over  medially.  Using laparoscopic ultrasound, the renal tumor was able to be  identified and confirmed on ultrasound imaging.  This measured  approximately 1.4-1.5 cm on multiple images that were taken.  Once the  kidney was appropriately mobilized, a site was selected on the skin for  percutaneous placement of the ice rods.  Initially, three ice rods were  placed through angiographic catheters  percutaneously with a thermocouple  placed approximately 1 cm lateral to the circumference of the ice rods.  Under ultrasound guidance, these ice rods were then placed into the  renal parenchyma to a depth of approximately 2.5 cm in order to ensure  adequate margins.  After each of the ice rods was placed, there was some  concern about coverage of the renal tumor anteriorly and, therefore, an  additional ice rod was placed anteriorly.  This appeared to have  adequate coverage of the renal tumor and the freezing cycle then began.  The tumor was frozen for 8 minutes and the thermocouple did reach  temperatures less than 0 degrees Celsius.  A passive thaw cycle then  ensued and an additional 7 minute freeze cycle was again performed with  the thermocouple reaching temperatures less than 20 degrees Celsius.  The second thaw cycle then ensued and the ice rods and thermocouple were  removed from the kidney.  There was noted be some bleeding from the  upper pole of the kidney and there was noted be a small capsular tear of  the liver without bleeding.  FloSeal was placed onto the liver site.  An  additional FloSeal and Surgicel was then placed over the upper pole of  the kidney into the sites where the ice rods had been placed.  The  kidney was then placed back into its normal anatomic position and  monitored.  The pneumoperitoneum was let down and there appeared to be  good hemostasis at this point.  Attention then turned to closure with 0  Vicryl, a 0 Vicryl suture used to laparoscopically close the patients 12-  mm port site.  All remaining ports were removed under direct vision and  the camera port site was closed with the previously placed 0 Vicryl  fascial sutures.  Marcaine 0.25% was then used to inject each of the  incision sites which were reapproximated at the skin with 4-0 Monocryl  sutures and Dermabond was used for skin closure.  The patient appeared  to tolerate the procedure well  and without complications.  She was able  to be extubated and transferred to the recovery unit in satisfactory  condition.      Raynelle Bring, MD  Electronically Signed     LB/MEDQ  D:  03/31/2009  T:  03/31/2009  Job:  [id number]

## 2011-01-01 NOTE — Cardiovascular Report (Signed)
. [facility]  Patient:    [name], [name]                        MRN: [id number] Proc. Date: 01/20/01 Adm. Date:  [id number] Attending:  Allene Dillon CC:         Monico Blitz, M.D.   Cardiac Catheterization  PROCEDURES PERFORMED:  Left heart catheterization/coronary arteriography.  INDICATIONS:  Evaluate patient with unstable angina and syncope.  PROCEDURAL NOTE:  Left heart catheterization was performed via the right femoral artery.  The artery was cannulated using anterior wall puncture.  A 6-French arterial sheath was inserted via the modified Seldinger technique. Preformed Judkins and a pigtail catheter was utilized.  The patient had severe chest discomfort, with ST segment depression anterolaterally indicating probable posterior acute injury during the catheterization.  She required aggressive management with IV nitrates, sublingual nitrates, morphine, and beta-blockers to reversed the discomfort.  By the time the procedure was over, she was without pain and hemodynamically stable.  ST segment changes had improved, but not completely resolved at the time of leaving the catheterization laboratory.  RESULTS:  HEMODYNAMICS:  LV 146/22, AO 149/79.  CORONARIES: The left main had distal 25% stenosis.  The LAD had severe proximal and mid diffuse disease with approximately 70% stenosis and focal 80% to 90% stenosis in the mid segment after a moderate sized second diagonal.  A small first diagonal had ostial 80% stenosis.  The circumflex had long proximal 30% stenosis.  There was mid 99% stenosis after a mid obtuse marginal.  The mid obtuse marginal was a very large vessel with an ostial and proximal long 80% stenosis.  A superior branch of this mid obtuse marginal had ostial 90% stenosis.  The right coronary artery was a dominant vessel.  It had subtotal stenosis in the mid segment and was severely diffusely diseased in the mid and  proximal segment.  There was TIMI-1 flow.  The distal RCA was seen to fill predominantly via septal collaterals.  LEFT VENTRICULOGRAM:  The left ventriculogram was obtained in the RAO and LAO projections.  The ejection fraction was approximately 40% with inferior and inferolateral akinesis.  DISTAL AORTOGRAM:  A distal aortogram was obtained second to the possibility of needing a balloon pump.  This demonstrated diffuse plaquing, but no high-grade obstruction.  CONCLUSION:  Critical three-vessel coronary artery disease with moderate left ventricular dysfunction.  PLAN:  The patient will be referred for CABG. DD:  01/20/01 TD:  01/20/01 Job: [id number]

## 2011-01-01 NOTE — Op Note (Signed)
[place]. [facility]  Patient:    [name], [name]                        MRN: [id number] Proc. Date: 01/22/01 Adm. Date:  [id number] Attending:  Darylene Price CC:         Carlena Bjornstad, M.D. [facility]  Minus Breeding, M.D. [facility]  Dr. Monico Blitz   Operative Report  PREOPERATIVE DIAGNOSIS:  Severe three-vessel coronary artery disease with class IV unstable angina.  POSTOPERATIVE DIAGNOSIS:  Severe three-vessel coronary artery disease with class IV unstable angina.  PROCEDURES:  Urgent median sternotomy for coronary artery bypass grafting x 4 (saphenous vein graft to distal left anterior descending coronary artery, left internal mammary artery to second diagonal branch, saphenous vein graft to circumflex marginal branch, saphenous vein graft to posterior descending coronary artery).  SURGEON:  Valentina Gu. Roxy Manns, M.D.  ASSISTANTRosealee Albee. Collins, P.A.-C.  ANESTHESIA:  General.  BRIEF CLINICAL NOTE:  Patient is a 76 year old white female from [facility] followed by Dr. Monico Blitz and referred by Dr. Dola Argyle and Dr. Minus Breeding for management of coronary artery disease.  The patient presents with a several-month history of rapidly progressive symptoms of exertional shortness of breath, palpitations, syncope, and atypical chest pain involving pain radiating to her neck.  She was set up for elective cardiac catheterization on June 7.  Prior to catheterization, the patient suffered a syncopal episode.  She subsequently underwent cardiac catheterization by Dr. Percival Spanish, which demonstrates severe three-vessel coronary artery disease with critical stenosis of the right coronary artery and left circumflex coronary arteries, respectively.  The patient developed unstable angina with EKG changes during the procedure and suffered repeated episodes of hypotension and shortness of breath following the procedure in the cardiac intensive care unit.  The patient  is seen in consultation and felt to be a candidate for urgent coronary artery bypass grafting.  Further preoperative evaluation is notable for the patients history of hemolytic anemia.  An attempt at type and crossmatch for patients blood to be used at the time of surgery identified the presence of a cold autoantigen such that the patients blood cannot be crossmatched.  Blood samples are sent to the [employer], and type-specific blood is obtained with use of the patients own serum to pretreat the blood to make transfusion safer.  After adequate blood is found to be available for surgery, the patient is then brought to the operating room as described.  DESCRIPTION OF PROCEDURE:  The patient is brought to the operating room on the above-mentioned date, and invasive hemodynamic monitoring is established by the anesthesia service under the care and direction of Finis Bud, M.D. The patient is placed in the supine position on the operating table. Intravenous antibiotics are administered.  Pulse dose of intravenous steroids are also administered.  Following induction with general endotracheal anesthesia, the patients chest, abdomen, both groins, and both lower extremities are prepared and draped in a sterile manner.  A median sternotomy incision is performed, and the left internal mammary artery is dissected from the chest wall and prepared for bypass grafting.  The left internal mammary artery is very small-caliber and has relatively slow flow.  It is treated with papaverine, and a 1 mm probe is passed through the vessel.  Spasm is relieved and the flow improved.  However, antegrade flow through this vessel at best remains small.  As a result, this graft is used to place  bypass to diagonal branch rather than to the left anterior descending coronary artery because of concern regarding the grafts side and ability to maintain adequate blood flow.  Simultaneously saphenous vein is  obtained from the patients right thigh and the upper portion of the right lower leg through longitudinal incisions.  The saphenous vein is felt to be good quality and caliber, although it becomes too small to be used below the knee.  Therefore, an additional segment of saphenous vein is obtained from the patients left thigh through longitudinal incisions.  The patient is heparinized systemically.  The pericardium is opened.  The ascending aorta is normal in appearance.  The ascending aorta and the right atrium are cannulated for cardiopulmonary bypass.  Adequate heparinization is verified.  Cardiopulmonary bypass is begun, and the surface of the heart is inspected. Distal sites are selected for coronary bypass grafting.  The left anterior descending coronary artery is intramyocardial during the majority of its course down the anterior wall of the left ventricle.  All of the epicardial coronary arteries are relatively small-caliber.  Portions of saphenous vein and the left internal mammary artery are trimmed to appropriate lengths.  A temperature probe is placed in the left ventricular septum, and a Styrofoam pad is placed to protect the left phrenic nerve from thermal injury.  A cardioplegia catheter is placed in the ascending aorta.  Patient is cooled to 32 degrees systemic temperature.  The aortic crossclamp is applied, and cardioplegia is delivered in an antegrade fashion through the aortic root.  Iced saline slush is applied for topical hypothermia.  The initial cardioplegic arrest and myocardial cooling are felt to be excellent. Repeat doses of cardioplegia are administered intermittently throughout the crossclamp portion of the operation, both through the aortic root and down subsequently-placed vein grafts to maintain septal temperature below 15 degrees Centigrade.  The following distal coronary anastomoses are performed:  (1) The posterior descending coronary artery is grafted  with a saphenous vein graft in an end-to-side fashion.  This coronary measures 1.2 mm in diameter and is of fair to good quality.  (2) The second circumflex marginal branch is grafted with a saphenous vein graft in an end-to-side fashion.  This coronary measures 1.3 mm in diameter and is of good quality.  (3) The second diagonal branch off the left anterior descending coronary artery is grafted with the left internal mammary artery in an end-to-side fashion.  This coronary measures 1.1 mm in diameter and is of good quality.  (4) The distal left anterior descending coronary artery is grafted with a saphenous vein graft in an end-to-side fashion.  This coronary measures 1.2 mm in diameter and is of fair quality.  All three proximal saphenous vein anastomoses are performed directly to the ascending aorta prior to the removal of the aortic crossclamp. The septal temperature is noted to rise appropriately after reperfusion of the left internal mammary artery.  The heart begins to beat spontaneously.  All air is evacuated form the aortic root.  The aortic crossclamp is removed after a total crossclamp time of 74 minutes.  The heart begins to beat spontaneously without need for cardioversion.  All proximal and distal anastomoses are inspected for hemostasis and appropriate graft orientation.  Epicardial pacing wires are affixed to the right ventricular outflow tract and to the right atrial appendage.  The patient is weaned from cardiopulmonary bypass without difficulty.  The patients rhythm at separation from bypass is normal sinus rhythm.  No inotropic support is required.  Total cardiopulmonary bypass time for the operation is 107 minutes.  The venous and arterial cannulae are removed uneventfully.  Protamine is administered to reverse the anticoagulation.  The mediastinum and the left chest are irrigated with saline solution containing vancomycin.  Meticulous surgical hemostasis is  ascertained.  The mediastinum and the left chest are drained with three chest tubes placed through separate stab incisions inferiorly.  The median sternotomy is closed in routine fashion.  The lower extremity incisions are closed in multiple layers in routine fashion.  All skin incisions are closed with subcuticular skin closures with the exception of a short segment of the sternal incision superiorly, in which interrupted vertical mattress Prolene sutures are placed due to the patients very thin and frail skin.  There are no intraoperative complications.  The patient tolerated the procedure well and is transported to the surgical intensive care unit in stable condition.  The patient was transfused two units autologous blood during the procedure, including one unit transfused during cardiopulmonary bypass and one transfused following reversal of heparin with protamine.  All sponge, needle, and instrument counts were verified correct at completion of the operation. DD:  01/22/01 TD:  01/23/01 Job: [id number]

## 2011-01-01 NOTE — Discharge Summary (Signed)
[name], [name]               ACCOUNT NO.:  [id number]   MEDICAL RECORD NO.:  [id number]          PATIENT TYPE:  INP   LOCATION:  2010                         FACILITY:  [facility]   PHYSICIAN:  Deborra Medina, M.D.     DATE OF BIRTH:  16-May-1935   DATE OF ADMISSION:  01/04/2007  DATE OF DISCHARGE:  01/12/2007                               DISCHARGE SUMMARY   DISCHARGE DIAGNOSES:  1. Recurrent C diff colitis.  2. Volume depletion.  3. Hypomagnesemia secondary to diarrhea.  4. Protein calorie malnutrition.  5. Acute gout.  6. Anemia, a history of hemolytic anemia of autoimmune origin.  7. History of coronary artery disease.  8. History of prolonged QT.  9. Osteoporosis.  10.Mild chronic obstructive pulmonary disease.  11.Chronic left shoulder pain from rotator cuff injury.  12.History of coronary artery bypass graft surgery.  13.History of cholecystectomy for gallstones.  14.Glucose intolerance.  15.History of suprapubic fracture.  16.History of pneumonia in April 2008, with septic shock, requiring      ventilator dependence.  17.History of renal cell mass   DISCHARGE MEDICATIONS:  1. Vancomycin 250 mg p.o. for C diff colitis; medication taper:  1 tab      q.i.d. for 4 weeks, 1 tab t.i.d. for a 3 weeks, 1 tab b.i.d. for 3      weeks, 1 tab daily for 3 weeks, and then stop.  2. Magnesium oxide 400 mg, one tablet b.i.d. for 2 weeks.  3. Potassium chloride 40 mEq, one tablet p.o. daily.  4. Flagyl 500 mg, one tablet t.i.d. for 2 weeks.  5. Phenergan 12.5 mg, one tablet q.6 h p.r.n. for nausea.  6. Prednisone taper 20 mg, 10 mg, 5 mg until evaluated by Dr. Manuella Ghazi.  7. Questran 4 grams b.i.d. before meals for diarrhea, do not exceed      use for more than 2 weeks.  8. Pletal 100 mg p.o. daily.  9. Ultram 50 mg, one tablet p.o. q.6 h p.r.n. for gout pain.  10.Metoprolol 25 mg, one tablet p.o. b.i.d.   MEDICATIONS STOPPED:  Hold colchicine for 2-3 weeks, hold Lasix until  you see  Dr. Manuella Ghazi.   DISPOSITION:  [name] was discharged from the hospital in stable and  improved condition.   FOLLOWUP:  1. She has an appointment with her primary care physician, Dr. Manuella Ghazi of      [facility] on January 17, 2007.  2. She has an appointment with Dr. Michail Sermon, gastroenterology, in 6      weeks.  3. She also has an appointment scheduled with hematology at the [facility] on for followup of her hemolytic      anemia.   OTHER ISSUES AT FOLLOWUP:  She will need a follow-up MRI of her abdomen  in 6-12 months secondary to findings of a renal mass.  These followup  with issues were discussed by telephone with Dr. Manuella Ghazi on the day of her  discharge.  At the time of follow-up with Dr. Manuella Ghazi, she will have her  magnesium and  potassium checked and be evaluated for resolution of her  diarrhea and will need to be followed closely while she is on her  vancomycin taper.   PROCEDURES PERFORMED:  1. Chest x-ray, two views, on Jan 05, 2007:  Changes compatible with      COPD, bibasilar scarring or atelectasis, prominence of the      pulmonary artery suggesting mild pulmonary arterial hypertension.  2. X-ray of left shoulder:  No acute osseous abnormality.  3. MRI of the abdomen with and without contrast, findings include:      Large hemangioma in the right hepatic lobe, a complex left renal      cystic lesion.  It measures just under 1-cm in diameter.      Quantitative assessment is difficult due to large volume related to      the size of the lesion.  Per the radiologist, highly likely to be      benign but will require followup.  The recommendation is initial      followup by CT or MRI in 6-12 months   CONSULTATIONS:  Dr. Michail Sermon of gastroenterology.   BRIEF ADMITTING HISTORY AND PHYSICAL:  [name] is a 76 year old woman  with a past medical history significant for coronary artery disease,  status post coronary artery bypass graft and CHF with an  EF of 40-45%  who presents to the [facility] emergency department with severe nausea,  vomiting, and diarrhea that started several days prior to admission.  She had 5-6 watery stools and 3-4 episodes of emesis on the day she  presented to the emergency department.  She was recently discharged from  [facility] after being hospitalized for a diagnosis C diff  colitis.  She completed therapy in the hospital but had a return of her  symptoms.  The initial cases of C diff colitis probably resulted  following a long hospital stay where she was treated for septic shock  from a pneumonia in March 2008, and received multiple courses of IV  antibiotics and had increased risk factors for the development of a  resistant case of C diff colitis.   HOSPITAL COURSE BY PROBLEM:  1. Nausea, vomiting, and diarrhea.  By history, [name] had a      recurrent C diff colitis.  This was supported with positive stool      cultures.  She was started on p.o. vancomycin and IV Flagyl.  She,      as a result of her symptoms, had severe volume depletion with an      anion gap of 14 and decreased bicarb secondary to her diarrhea.      She was aggressively volume resuscitated.  Hemodynamically, she was      stable.  And, there were no signs of sepsis.  She did have a      temperature spike to 101 and an increase leukocytosis.  All of      which were consistent with infection with C-difficile.  She was      monitored for several days in the step-down unit, given her cardiac      history and history of septic shock.  Once stabilized, she was      transferred to the regular medical floor where she continued to      improve and respond to replacement of her electrolytes and IV      fluids.  Her diet was advanced to clear liquids over the course of  several days and at the time of discharge she was tolerating a      regular diet. 2. A renal mass.  An incidental finding on MRI/CT of her abdomen      revealed a  possible renal cell carcinoma.  The MRI was obtained in      order to further characterize the lesion.  The lesion was indeed      complex, however, the radiological interpretation recommended      follow-up in 6-12 months.  3. Coronary artery disease.  [name] did have T-wave inversions in      V2, V3.  We did cycle her cardiac enzymes which were negative and      checked her serial EKGs, all of which showed no significant      changes.  She is followed in the outpatient setting by [facility]      [facility].  4. Hypomagnesemia and hypokalemia secondary to volume depletion and      diarrhea.  She was given magnesium sulfate and potassium in order      to replete these electrolytes that were lost with her multiple      episodes of diarrhea.  She does have a history of QT prolongation,      therefore, we will pay close attention to keeping these within      normal limits.  At the time of discharge,  laboratories these were      both normal.  5. Anemia.  By history, [name] reports an autoimmune hemolytic      anemia diagnosed back in the 1980s at [facility].  She reports her baseline hemoglobin is around 10, and she      has had many transfusions in the past.  We monitored her closely for blood loss in her stool.  She did require  transfusion of 2 units PRBC on day #2 of her hospitalization.  1. Chronic obstructive pulmonary disease, stable during this      admission.  She did receive p.r.n. albuterol.  2. Protein calorie malnutrition.  [name] had a 5 pound weight loss      prior to admission.  Her prealbumin was low but returned to a      normal level at the time of discharge after she was able to take      clear liquids and advance her diet to regular.  3. Osteoporosis.  The patient is on Actonel at home.  We will hold his      treatment until her gastrointestinal symptoms resolve. We will      restart this medication per Dr. Manuella Ghazi.   VITAL SIGNS AT  DISCHARGE:  CBC:  White blood cells 5.6, hemoglobin 10.6,  platelets 263, hematocrit 31.4, prealbumin 21.  Sodium 140, potassium  4.4, chloride 106, bicarb 26, BUN 7, creatinine 0.7.  Glucose 99.      Acquanetta Chain, D.O.       Deborra Medina, M.D.     Dorann Ou  D:  02/09/2007  T:  02/09/2007  Job:  [id number]   cc:   Monico Blitz, MD

## 2011-01-01 NOTE — Discharge Summary (Signed)
[place]  Patient:    [name], [name]                        MRN: [id number] Adm. Date:  [id number] Disc. Date: [id number] Attending:  Darylene Price Dictator:   Ricki Miller, P.A.C. CC:         Carlena Bjornstad, M.D. [facility] H. Roxy Manns, M.D.  Dr. Brigitte Pulse   Discharge Summary  HISTORY OF PRESENT ILLNESS:  This is a 76 year old female who has been followed by Dr. Carlena Bjornstad of the [facility] Service.  She has had some recent complaints of shortness of breath as well as palpitations.  A 2-D echocardiogram done on Dec 28, 2000 showed an ejection fraction of 45% with severe hypokinesis of the inferior wall at the base.  There was no documented coronary disease prior to this.  A nuclear exercise test showed an ejection fraction slightly lower.  There was no significant ischemia.  The patient did wear an event recorder which showed some PVCs but no marked ectopy.  Heart catheterization was discussed with the patient but she wished to go on vacation and then come back and discuss it.  The patient also has had on occasion sensation after walking up some stairs that she became very weak and diaphoretic.  On one event, she laid down on the floor and eventually got to a couch and her family came.  There may have been some mild decreased consciousness.  She was taken to [facility] but not records are available.  Cardiac enzymes were negative as was CT scan which showed no significant abnormalities.  There was some question that she was mildly volume overloaded but she was stabilized and sent home.  Occasionally, since that time, she has had some sensations of discomfort in her neck.  This was felt to be possibly ischemia for her as she was noted to receive some relief with nitroglycerin.  She was admitted this hospitalization for further evaluation and treatment including cardiac catheterization.  PAST MEDICAL  HISTORY:  History of palpitations with PVCs seen on the event recorder.  History of gastroesophageal reflux.  History of right shoulder pain in the past.  History of hemolytic anemia, followed at [facility] for which she is on chronic low dose prednisone.  History of left ventricular dysfunction as described above, felt to be in the 40-45% range.  Inferior wall motion abnormality most likely representative of coronary artery disease.  She also has a hemangioma on her liver.  MEDICATIONS:  Aspirin, isosorbide, Celebrex, prednisone, Prilosec, furosemide, and Toprol.  ALLERGIES:  No known drug allergies.  FAMILY HISTORY:  Please see the history and physical done at the time of admission.  SOCIAL HISTORY:  Please see the history and physical done at the time of admission.  REVIEW OF SYSTEMS:  Please see the history and physical done at the time of admission.  PHYSICAL EXAMINATION:  Please see the history and physical done at the time of admission.  HOSPITAL COURSE:  The patient was admitted January 20, 2001.  She had an event where she became briefly unresponsive.  The patient was stabilized and cardiac catheterization proceeded on January 20, 2001.  Findings were remarkable for severe three vessel coronary artery disease and mildly left ventricular dysfunction including subtotal right coronary artery, 95-99% left circumflex, and a large 80% mid-LAD stenosis.  Due to these findings, cardiac surgical consultation was obtained  with Dr. Valentina Gu. Roxy Manns who evaluated the patient and studies and agreed with recommendations to proceed with coronary artery bypass grafting.  Dr. Valentina Gu. Roxy Manns discussed the fact that some of her symptoms of syncopal episodes have been present for years and they may not be related to her coronary artery disease and therefore may persist in the postoperative period. It was noted that the preoperative chest x-ray revealed a questionable small upper lobe  nodule and therefore a CT scan was obtained which was negative in regard to a pulmonary lesion.  However, there was found to be a large hemangioma in the liver.  The patient also was noted to have significant antibodies and required slight delay in her surgical procedure as the [employer] had to become involved in the transfusion process with blood to be "washed."  On January 22, 2001, the patient underwent the following procedure:  Coronary artery bypass grafting x 4.  The following grafts were placed:  Left internal mammary artery to the diagonal, saphenous vein graft to the LAD,  saphenous vein graft to the posterior descending, saphenous vein graft to the circumflex.  The patient required a transfusion of two units of packed cells. She came off cardiopulmonary bypass in normal sinus rhythm requiring no inotropic agents and was taken to the surgical intensive care unit in stable condition.  The patient did well postoperatively.  The patient was extubated and neurologically intact.  She remained stable hemodynamically.  She was started on her course of routine care and rehabilitation.  Her prednisone was resumed for her history of hemolytic anemia.  She did have some postoperative symptoms of dizziness but these did show a gradual improvement.  It was not felt that she required further evaluation during this hospitalization and this problem was to be addressed in the postoperative period.  She had no significant cardiac dysrhythmias.  Laboratory values stabilized. She underwent a gentle diuresis responding well to Lasix.  She showed good healing in regards to her incisions without signs of infection.  She was tolerating activities commensurate for level of postoperative convalescence  and on January 29, 2001 she was felt to be quite stable for discharge.  DISCHARGE MEDICATIONS: 1. At that time, medications on discharge including coated aspirin 1 q.d. 2. Darvocet-N 100 with 1-2 q.4-6h.  p.r.n. pain. 3. Lopressor 25 mg q.12h. 4. Protonix 40 mg q.d. 5. Folic acid 1 mg q.d. 6. Prednisone 5 mg q.d. 7. Colace p.r.n.  DISCHARGE INSTRUCTIONS:  The patient received written instructions in regard to medications, activity, diet, wound care, and follow-up.  Follow up will include suture removal at the CVTS office on February 01, 2001.  Dr. Valentina Gu. Roxy Manns to see the patient on February 20, 2001.  Dr. Carlena Bjornstad see the patient in the [facility] office on February 13, 2001.  FINAL DIAGNOSES: 1. Coronary artery disease with left ventricular dysfunction. 2. History of previous tobacco abuse. 3. History of near syncope and chronic dizziness. 4. History of hemolytic anemia on chronic prednisone. 5. History of palpitations and premature ventricular contractions. 6. Recently diagnosed large hemangioma on the liver by computerized    tomography scan. 7. Gastroesophageal reflux.  CONDITION ON DISCHARGE:  Stable and improved. DD:  03/14/01 TD:  03/14/01 Job: [id number]

## 2011-01-01 NOTE — Consult Note (Signed)
[place]. [facility]  Patient:    [name], [name]                        MRN: [id number] Proc. Date: 01/22/01 Adm. Date:  [id number] Attending:  Darylene Price CC:         Valentina Gu. Roxy Manns, M.D.  Dr. Manuella Ghazi, [facility]  Nikki Dom, M.D., [facility], [facility], [place], Attention Dr. Sonny Dandy   Consultation Report  HISTORY OF PRESENT ILLNESS:  Patient is a 76 year old female patient on the CVTS service, seen secondary to prior history of hemolytic anemia, treated at [facility], in the presence of minor antibodies, making crossmatch more difficult.  Past medical history positive for dyspnea developing in this prior smoker with a positive family history of heart disease.  Echocardiogram from May of this year showed an ejection fraction of 45% with severe hypokinesis of the inferior wall at the base.  She had mild mitral regurgitation and aortic valve sclerosis.  Nuclear scan Dec 22, 2000, showed a 37% ejection fraction with no significant ischemia.  On a 24-hour Holter, there were rare PVCs.  She was initially worked up by Dr. Manuella Ghazi in [place] and in the [facility] at [place].  Laboratory studies from January 18, 2001, showed a white count of 10.3, hemoglobin 13, hematocrit 37.6%, MCV of 93.3, RDW of 13.1, platelet count 411,000.  PT of 12.1 seconds, PTT of 23.7 seconds.  Glucose 154, BUN 14, creatinine 1.3.  She had a diaphoretic episode May 25, when she was out of town.  She was worked up at an outside hospital with a cranial CT scan being negative and cardiac enzymes negative x 3 in [place], [place].  At that time it was noted she had a history of autoimmune hemolytic anemia that had been followed for several years at [facility] on prednisone that had been stable.  Her chronic prednisone dose was 5 mg a day.  She was admitted May 7 for catheterization, but prior to her cardiac  catheterization she had a near-syncopal event, for which a neurology consult was obtained.  Her carotid studies did not show any significant obstruction.  Cardiac catheterization showed severe three-vessel coronary artery disease, and post catheterization she developed chest pain.  She subsequently was scheduled for coronary artery bypass grafting today.  It was difficult to type her blood.  By the [employer] and blood bank working together, I was told that the patient had "antibody of cold-reacting class but reacting at 37 degrees."  We were able to obtain packed red blood cells that were washed that were compatible for transfusion prior to surgery.  I spoke with the Selina Cooley, [phone], and the chief perfusionist, Audrie Lia, [id number], and then cleared her for surgery from a hematologic standpoint.  Other labs included a DAT that was negative.  IgG was negative.  Antibody screen was positive.  Blood type is O positive.  Urinalysis from January 20, 2001, showed moderate hemoglobin, negative ketones, negative blood, negative bacteria.  January 20, 2001, creatinine 1.1, albumin 3.3, total bilirubin of 1.2. From June 7, white count 9.4, hemoglobin 11.5, hematocrit 33%, platelet count 320,000.  MCV of 91.  RDW 13.7%.  From June 8, white count 6.9, hemoglobin 11.5, hematocrit 32.8%, platelet count 302,000.  From June 8, white count 6.9, hemoglobin 11.5, platelet count 302,000.  Preoperatively today, white count 13.1, hemoglobin 12, platelet count  of 182,000.  She was given two units of washed packed red blood cells intraoperatively and then another unit postoperatively.  CT scan of the chest June 8 showed a linear scar of the lung, COPD changes, and a mass in the posterior right lobe of the liver most consistent with a hemangioma.  ALLERGIES:  None.  MEDICATIONS:  Home medications include prednisone 5 mg p.o. q.d. chronically.  PAST MEDICAL HISTORY:  Positive for  gastroesophageal reflux disease and osteoporosis.  SOCIAL HISTORY:  She stopped smoking 2-1/2 years ago.  No alcohol use.  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  Her home is in [place], [place].  FAMILY HISTORY:  Positive for coronary artery disease.  REVIEW OF SYSTEMS:  Unable to obtain, since she is intubated.  PHYSICAL EXAMINATION:  VITAL SIGNS:  Temperature 97.1, blood pressure 100/65, pulse 90, respirations 10.  GENERAL:  She has multiple areas that she could bleed from needle sticks but is not bleeding.  Her urine output is 20-40 cc/hr., and her urine is quite clear and not dark.  CHEST:  Her lungs were actually clear.  ASSESSMENT:  Autoimmune hemolytic anemia on essentially a chronic physiologic dose of prednisone 5 mg a day.  She has an antibody that the [employer] has detected on indirect Coom[place] test, but that does not seem to have had any significant hemolysis based on her labs prior to or during her procedure.  PLAN:  Will get a hemoglobin and hematocrit every 12 hours and follow her LDH and bilirubin q.d. as well as a reticulocyte count.  Will also check her haptoglobin level once a day.   This will give [place] some idea of if she is starting to hemolyze.  She is already written for hydrocortisone, which I agree with.  Will ask the blood bank to keep two units of washed packed red blood cells, and I would transfuse all of her units through a WBC filter. Will watch her urine output closely.  Once she is discharged, we can arrange hematology follow-up at [facility] through Dr. Sonny Dandy.  When she is able to take p.o., I would go ahead and start her on folic acid 1 mg p.o. q.d.  Thank you for allowing [place] to share in Ms. Craigs care.DD:  01/22/01 TD:  01/23/01 Job: [id number]

## 2011-01-01 NOTE — Consult Note (Signed)
[place]  Patient:    [name], [name]                        MRN: [id number] Adm. Date:  [id number] Attending:  Allene Dillon                          Consultation Report  PATIENTS ADDRESS:  [address] [place], [place] [address]  DATE OF BIRTH:  Dec 24, 1934  REASON FOR ADMISSION:  This 76 year old, right-handed, white married female is seen at the request of Carlena Bjornstad, M.D. for evaluation of near syncope.  HISTORY OF PRESENT ILLNESS:  This patient has a past history of at least three, and possibly four, episodes of near syncope. The first occurred nine months ago when she had an attack when she was at work. She was sitting with fellow smokers in a smoking section. She had quit smoking and was sitting there and felt "sick." It was noted that she had decreased blood pressure at the time, and there was questionable loss of consciousness. There was no urinary or bowel incontinence or tongue biting. She was taken to [facility] where results of studies at that time were reported to the patient as being normal. She did well until Saturday, Jan 07, 2001, when she was in [place], [place], visiting her daughter. At that time, she had an episode of feeling very hot. She laid down on a floor but never improved. The rescue squad was called, and she was admitted to the hospital in [place], [place], from Saturday through Sunday afternoon. At that time, she states she had an electrocardiogram and CAT scan. It was reported to her that the CAT scan was normal. She was in her usual state of health until this morning when she was getting ready to have a cardiac catheterization. She developed an urge to urinate. Went to the toilet. On the toilet, called her husband. Nurse came in, and she was unresponsive but not limp. She went back to bed. Her blood pressure was normal. Pulse was 70. She was not on the monitor at the time.  She has complained of jaw and neck discomfort recently. She has undergone cardiac catheterization this day which showed evidence of a severe proximal, mid and diffuse disease in the left anterior descending of approximately 70% with focal 80 to 90% mid lesion. There was a small ostial lesion of 80%. She had circumflex a long proximal 30%, mid 99%. She had 80% superior branch and 90% partial branch. The right coronary artery shows subtotal. She has complaint of neck pain and jaw pain following the procedure. It is felt that she has severe three-vessel disease and is not a medical candidate and may need surgery. She had EKG changes of possible acute injury while in the catheterization lab.  PAST MEDICAL HISTORY:  She has no history of diabetes mellitus, high blood pressure, or stroke.  SOCIAL HISTORY:  She quit smoking cigarettes in the past.  PHYSICAL EXAMINATION:  GENERAL:  Well-developed, white female who was in the post cardiac catheterization unit. She was a well-developed white female.  VITAL SIGNS:  Blood pressure in right arm 145/80, left arm 140/80; heart rate was 70.  NECK:  There were no bruits. She complained of neck and jaw pain.  NEUROLOGICAL:  Mental status:  She is alert and oriented x 3. Her cranial nerve examination revealed visual fields full. Disks flat.  Extraocular movements full. Corneals present. She had good gags. There was no facial motor asymmetry. Hearing was intact. Air conduction greater than bone conduction. The uvula was midline. Gags were present. Sternocleidomastoid and trapezius testing were normal. Motor exam revealed 4.5/5 strength in the upper extremities and the left lower extremity. Right was not examined. Sensory examination was intact to pinprick, two-point discrimination, graphesthesia in the hands were equal, vibration was felt everywhere. Deep tendon reflexes were 2+, and plantar responses were downgoing.  IMPRESSION: 1. Vasovagal  reaction, code 780.2. 2. Coronary artery disease, code 429.2.  PLAN:  The plan is to follow the patient with you at this time and to obtain carotid Doppler studies if possible. DD:  01/20/01 TD:  01/20/01 Job: [id number]

## 2011-02-15 ENCOUNTER — Ambulatory Visit (INDEPENDENT_AMBULATORY_CARE_PROVIDER_SITE_OTHER): Payer: Medicare Other | Admitting: Adult Health

## 2011-02-15 ENCOUNTER — Encounter: Payer: Self-pay | Admitting: Adult Health

## 2011-02-15 VITALS — BP 153/72 | HR 81 | Ht 64.0 in | Wt 111.0 lb

## 2011-02-15 DIAGNOSIS — I080 Rheumatic disorders of both mitral and aortic valves: Secondary | ICD-10-CM

## 2011-02-15 DIAGNOSIS — I200 Unstable angina: Secondary | ICD-10-CM

## 2011-02-15 DIAGNOSIS — I2510 Atherosclerotic heart disease of native coronary artery without angina pectoris: Secondary | ICD-10-CM

## 2011-02-15 MED ORDER — NITROGLYCERIN 0.4 MG SL SUBL: 0.4000 mg | SUBLINGUAL_TABLET | SUBLINGUAL | Status: AC | PRN

## 2011-02-15 MED ORDER — ISOSORBIDE MONONITRATE ER 30 MG PO TB24: 30.0000 mg | ORAL_TABLET | Freq: Every day | ORAL | Status: DC

## 2011-02-15 NOTE — Assessment & Plan Note (Addendum)
Review of previous records demonstrate that she has had episodes of unstable angina with repeat cardiac catheterizations.  Most recent in 2009.   This revealed triple native vessel disease s/p 4 vessel CABG with 2/4 gafts occluded..  Ostial LM stenosis with normal LV fx.  It was recommended that she continue medical therapy.  The most concerning area in the angiograms is the ostial stenosis of the LM.  Both of the bypass grafts to the anterior circulation are 100% occluded. The mid and distal LAD get some filling from the RCA.  This stenosis could potentially compromise flow through the proximal and midportions of the LAD gives rise to large septal branches.  "If the patient continues to have lifestyle limiting again, it may worthwhile to consider an IVUS of the ostial LM with potential stenting.  This will be a high risk procedure as this left main is currently unprotected."  I have discussed this at length with the patient and her husband. They would like to avoid another cardiac catheterization at this time.  I will place her back on imdur 30mg  daily and she will have new Rx of NTG sublingual. I have advised her to use this for recurrent chest pain. If pain continues and is more worrisome, intense and is longer in duration, she is to come to ER immediately.  She verbalizes understanding. She will be seen in 2 weeks for follow-up of symptoms.  They wish to follow in [place] office at this time and she will come with her husband on his appointment follow-up.  I have restressed the importance of close follow-up and going to ER for recurrent pain.

## 2011-02-15 NOTE — Progress Notes (Signed)
HPI: [name] is a delightful 76 y/o CF patient of Dr. Ron Parker in the [place] office with known history of unstable angina, CABG, with 2/4 grafts occluded per 2009 cardiac cath-to be treated medically, mild PAD, history of prolonged QT interval and CKD.  She is here with her [name] at his appointment and states that she would like to have appointment in our office as well because she is having recurrent chest pain. She states on last or next to last appointment with Dr. Ron Parker she was mildly hypotensive and was taken off Imdur 60mg  daily. She has had recurrent chest discomfort with neck and jaw pain for the last month or so with associated weakness and nausea.  This usually occurs when she is sleeping and awakens her. She occasionally has the same symptoms while awake. Has notice the duration to be anywhere from 5 minutes to 30 minutes. She does not, however, take a NTG tablet.  She states she is not sure if she should because she has heartburn with this as well and tums seems to help.  I have asked her to be seen in clinic today to assess her properly instead of making another appointment. Currently she is without complaint.   Allergies  Allergen Reactions  . Codeine     REACTION: hallucinate    Current Outpatient Prescriptions  Medication Sig Dispense Refill  . ALPRAZolam (XANAX) 0.25 MG tablet Take 0.25 mg by mouth at bedtime as needed.        Marland Kitchen aspirin 81 MG tablet Take 81 mg by mouth daily.        . calcium carbonate (TUMS - DOSED IN MG ELEMENTAL CALCIUM) 500 MG chewable tablet Chew 1 tablet by mouth daily.        . furosemide (LASIX) 40 MG tablet Take 40 mg by mouth 2 (two) times daily.        . hydrocodone-acetaminophen (LORCET-HD) 5-500 MG per capsule Take 1 capsule by mouth every 6 (six) hours as needed.        . metoprolol succinate (TOPROL-XL) 25 MG 24 hr tablet Take 25 mg by mouth daily.        . nitroGLYCERIN (NITROSTAT) 0.4 MG SL tablet Place 1 tablet (0.4 mg total) under the tongue every 5  (five) minutes as needed.  25 tablet  3  . omeprazole (PRILOSEC) 20 MG capsule Take 20 mg by mouth daily.        . potassium chloride SA (K-DUR,KLOR-CON) 20 MEQ tablet Take 20 mEq by mouth 2 (two) times daily.        . predniSONE (DELTASONE) 5 MG tablet Take 5 mg by mouth daily.        . sertraline (ZOLOFT) 50 MG tablet Take 50 mg by mouth daily.        . simvastatin (ZOCOR) 40 MG tablet TAKE ONE TABLET BY MOUTH AT BEDTIME EMERGENCY REFILL FAXED DR  30 tablet  5  . spironolactone (ALDACTONE) 25 MG tablet Take 25 mg by mouth daily.        Marland Kitchen tiotropium (SPIRIVA) 18 MCG inhalation capsule Place 18 mcg into inhaler and inhale daily.        Marland Kitchen DISCONTD: nitroGLYCERIN (NITROSTAT) 0.4 MG SL tablet Place 0.4 mg under the tongue every 5 (five) minutes as needed.        . isosorbide mononitrate (IMDUR) 30 MG 24 hr tablet Take 1 tablet (30 mg total) by mouth daily.  30 tablet  6    Past Medical  History  Diagnosis Date  . Coronary artery disease   . History of prolonged Q-T interval on ECG     Past Surgical History  Procedure Date  . Coronary artery bypass graft   . Cardiac catheterization 2009    2/4 occluded grafts most concerning area was the ostieal stenosis of the LM . Both bypass grafts to the anterioro cirulation are occluded. The mida and distal lLAD gets some filling from the RCA.Stenosis of the LM could potentially sompromise flow to the promixmal and midportion of in the LAD. Intra coronary [place] could be considered should she continue to be symptomatic.  Tx medically for now.    [id number] of systems complete and found to be negative unless listed above PHYSICAL EXAM BP 153/72  Pulse 81  Ht 5\' 4"  (1.626 m)  Wt 111 lb (50.349 kg)  BMI 19.05 kg/m2  SpO2 94% General: Well developed, well nourished, in no acute distress Head: Eyes PERRLA, No xanthomas.   Normal cephalic and atramatic  Lungs: Clear bilaterally to auscultation and percussion. Heart: HRRR S1 S2,  Pulses are 2+ & equal.             No carotid bruit. No JVD.  No abdominal bruits. No femoral bruits. Abdomen: Bowel sounds are positive, abdomen soft and non-tender without masses or                  Hernia's noted. Msk:  Back normal, normal gait. Normal strength and tone for age. Extremities: No clubbing, cyanosis or edema.  DP +1 Neuro: Alert and oriented X 3. Psych:  Good affect, responds appropriately  EKG: NSR rate of 70 bpm. Anterior infarct age undetermined.  ASSESSMENT AND PLAN

## 2011-02-15 NOTE — Patient Instructions (Signed)
Your physician recommends that you schedule a follow-up appointment in:2 weeks   Your physician has recommended you make the following change in your medication: isosorbide 30mg  daily, refills for nitroglycerin

## 2011-03-01 ENCOUNTER — Ambulatory Visit (INDEPENDENT_AMBULATORY_CARE_PROVIDER_SITE_OTHER): Payer: Medicare Other | Admitting: Adult Health

## 2011-03-01 ENCOUNTER — Encounter: Payer: Self-pay | Admitting: Adult Health

## 2011-03-01 ENCOUNTER — Other Ambulatory Visit: Payer: Self-pay | Admitting: Adult Health

## 2011-03-01 ENCOUNTER — Other Ambulatory Visit: Payer: Self-pay

## 2011-03-01 DIAGNOSIS — I2510 Atherosclerotic heart disease of native coronary artery without angina pectoris: Secondary | ICD-10-CM

## 2011-03-01 DIAGNOSIS — I080 Rheumatic disorders of both mitral and aortic valves: Secondary | ICD-10-CM

## 2011-03-01 DIAGNOSIS — I200 Unstable angina: Secondary | ICD-10-CM

## 2011-03-01 DIAGNOSIS — I241 Dressler's syndrome: Secondary | ICD-10-CM

## 2011-03-01 DIAGNOSIS — E860 Dehydration: Secondary | ICD-10-CM

## 2011-03-01 MED ORDER — FUROSEMIDE 20 MG PO TABS: 20.0000 mg | ORAL_TABLET | Freq: Every day | ORAL | Status: DC

## 2011-03-01 NOTE — Patient Instructions (Signed)
**Note De-Identified  Obfuscation** Your physician has requested that you have an echocardiogram. Echocardiography is a painless test that uses sound waves to create images of your heart. It provides your doctor with information about the size and shape of your heart and how well your heart's chambers and valves are working. This procedure takes approximately one hour. There are no restrictions for this procedure.  Your physician recommends that you return for lab work in: today  Your physician has recommended you make the following change in your medication: stop taking Spironolactone  Your physician recommends that you schedule a follow-up appointment in: 1 month

## 2011-03-01 NOTE — Progress Notes (Signed)
HPI: Mrs Luger is a delightful 76 y/o CF patient of Dr. Ron Parker in the [place] office with known history of unstable angina, CABG, with 2/4 grafts occluded per 2009 cardiac cath-to be treated medically, mild PAD, history of prolonged QT interval and CKD.  She is here with her husband at his appointment and states that she would like to have appointment in our office as well because she is having recurrent chest pain. She states on last or next to last appointment with Dr. Ron Parker she was mildly hypotensive and was taken off Imdur 60mg  daily. She has had recurrent chest discomfort with neck and jaw pain for the last month or so with associated weakness and nausea.  This usually occurs when she is sleeping and awakens her. She occasionally has the same symptoms while awake. Has notice the duration to be anywhere from 5 minutes to 30 minutes.  On last visit I restarted the Imdur at 30mg  daily. She is here for follow-up of symptoms.  Allergies  Allergen Reactions  . Codeine     REACTION: hallucinate    Current Outpatient Prescriptions  Medication Sig Dispense Refill  . allopurinol (ZYLOPRIM) 100 MG tablet Take 100 mg by mouth daily.        Marland Kitchen ALPRAZolam (XANAX) 0.25 MG tablet Take 0.25 mg by mouth at bedtime as needed.        Marland Kitchen aspirin 81 MG tablet Take 81 mg by mouth daily.        . calcium carbonate (TUMS - DOSED IN MG ELEMENTAL CALCIUM) 500 MG chewable tablet Chew 1 tablet by mouth daily.        . furosemide (LASIX) 40 MG tablet Take 40 mg by mouth 2 (two) times daily.        . hydrocodone-acetaminophen (LORCET-HD) 5-500 MG per capsule Take 1 capsule by mouth every 6 (six) hours as needed.        . isosorbide mononitrate (IMDUR) 30 MG 24 hr tablet Take 1 tablet (30 mg total) by mouth daily.  30 tablet  6  . metoprolol succinate (TOPROL-XL) 25 MG 24 hr tablet Take 25 mg by mouth daily.       . midodrine (PROAMATINE) 2.5 MG tablet Take 2.5 mg by mouth 2 (two) times daily.        . nitroGLYCERIN (NITROSTAT)  0.4 MG SL tablet Place 1 tablet (0.4 mg total) under the tongue every 5 (five) minutes as needed.  25 tablet  3  . omeprazole (PRILOSEC) 20 MG capsule Take 20 mg by mouth daily.        . potassium chloride SA (K-DUR,KLOR-CON) 20 MEQ tablet Take 20 mEq by mouth 2 (two) times daily.        . predniSONE (DELTASONE) 5 MG tablet Take 5 mg by mouth daily.        . sertraline (ZOLOFT) 50 MG tablet Take 50 mg by mouth daily.        Marland Kitchen tiotropium (SPIRIVA) 18 MCG inhalation capsule Place 18 mcg into inhaler and inhale daily.          Past Medical History  Diagnosis Date  . Coronary artery disease   . History of prolonged Q-T interval on ECG     Past Surgical History  Procedure Date  . Coronary artery bypass graft   . Cardiac catheterization 2009    2/4 occluded grafts most concerning area was the ostieal stenosis of the LM . Both bypass grafts to the anterioro cirulation are occluded. The  mida and distal lLAD gets some filling from the RCA.Stenosis of the LM could potentially sompromise flow to the promixmal and midportion of in the LAD. Intra coronary [place] could be considered should she continue to be symptomatic.  Tx medically for now.    [id number] of systems complete and found to be negative unless listed above PHYSICAL EXAM BP 93/50  Pulse 73  Ht 5\' 4"  (1.626 m)  Wt 50.349 kg (111 lb)  BMI 19.05 kg/m2  SpO2 95% General: Well developed, well nourished, in no acute distress Head: Eyes PERRLA, No xanthomas.   Normal cephalic and atramatic  Lungs: Clear bilaterally to auscultation and percussion. Heart: HRRR S1 S2,  Pulses are 2+ & equal.            No carotid bruit. No JVD.  No abdominal bruits. No femoral bruits. Abdomen: Bowel sounds are positive, abdomen soft and non-tender without masses or                  Hernia's noted. Msk:  Back normal, normal gait. Normal strength and tone for age. Extremities: No clubbing, cyanosis or edema.  DP +1 Neuro: Alert and oriented X 3. Psych:  Good  affect, responds appropriately  EKG: NSR rate of 70 bpm. Anterior infarct age undetermined.  ASSESSMENT AND PLAN

## 2011-03-01 NOTE — Assessment & Plan Note (Signed)
Symptoms have resolved with nitrates.

## 2011-03-01 NOTE — Assessment & Plan Note (Signed)
She has had significant improvement in her symptoms with reinstitution of nitrates. However, BP is low on this visit at 93 /52.  I have rechecked this in the exam room and this does correlate to the initial triage reading.  I have reviewed most recent catheterization report which demonstrated EF of 55%.  I see that she is on spironolactone and lasix.  With normal LV fx, I do not think this is necessary any longer.  I will stop the spironolactone and decrease the lasix to 20 mg daily.  She will have a follow-up BMET to assess kidney fx.  Will see her in one month for continued evaluation.  She is to call sooner should she become symptomatic.  Will reassess LV fx with an echo as the catheterization was completed 2009 and no other echos had been completed since.

## 2011-03-02 LAB — BASIC METABOLIC PANEL
CO2: 26 mEq/L (ref 19–32)
Calcium: 9.8 mg/dL (ref 8.4–10.5)
Creat: 1.63 mg/dL — ABNORMAL HIGH (ref 0.50–1.10)
Sodium: 139 mEq/L (ref 135–145)

## 2011-03-03 ENCOUNTER — Ambulatory Visit (HOSPITAL_COMMUNITY)
Admission: RE | Admit: 2011-03-03 | Discharge: 2011-03-03 | Disposition: A | Discharge status: Home / Self Care | Payer: Medicare Other | Source: Ambulatory Visit | Attending: Adult Health | Admitting: Adult Health

## 2011-03-03 DIAGNOSIS — R55 Syncope and collapse: Secondary | ICD-10-CM | POA: Insufficient documentation

## 2011-03-03 DIAGNOSIS — I25810 Atherosclerosis of coronary artery bypass graft(s) without angina pectoris: Secondary | ICD-10-CM | POA: Insufficient documentation

## 2011-03-03 DIAGNOSIS — I2510 Atherosclerotic heart disease of native coronary artery without angina pectoris: Secondary | ICD-10-CM

## 2011-03-03 DIAGNOSIS — I080 Rheumatic disorders of both mitral and aortic valves: Secondary | ICD-10-CM

## 2011-03-03 NOTE — Progress Notes (Signed)
*  PRELIMINARY RESULTS* Echocardiogram 2D Echocardiogram has been performed.  Tera Partridge 03/03/2011, 10:25 AM

## 2011-03-04 ENCOUNTER — Telehealth: Payer: Self-pay | Admitting: *Deleted

## 2011-03-04 ENCOUNTER — Encounter: Payer: Self-pay | Admitting: *Deleted

## 2011-03-04 DIAGNOSIS — I10 Essential (primary) hypertension: Secondary | ICD-10-CM

## 2011-03-04 NOTE — Telephone Encounter (Signed)
Results given to pt, stopped lasix repeat labs ordered

## 2011-03-04 NOTE — Progress Notes (Signed)
Addended by: Johnnette Barrios on: 03/04/2011 04:25 PM   Modules accepted: Orders

## 2011-03-04 NOTE — Telephone Encounter (Signed)
Message copied by Johnnette Barrios on Thu Mar 04, 2011  4:18 PM ------      Message from: Lendon Colonel      Created: Thu Mar 04, 2011 10:49 AM       Tell patient to stop lasix. Follow-up BMET in 2 weeks.            Jory Sims NP

## 2011-03-05 ENCOUNTER — Encounter: Payer: Self-pay | Admitting: *Deleted

## 2011-03-19 ENCOUNTER — Telehealth: Payer: Self-pay | Admitting: Cardiology

## 2011-03-19 ENCOUNTER — Other Ambulatory Visit: Payer: Self-pay | Admitting: Adult Health

## 2011-03-19 NOTE — Telephone Encounter (Signed)
Results of 2 D Echo / tg

## 2011-03-19 NOTE — Telephone Encounter (Signed)
Left echo results on answering machine-THS

## 2011-03-20 LAB — BASIC METABOLIC PANEL
BUN: 26 mg/dL — ABNORMAL HIGH (ref 6–23)
Calcium: 9.4 mg/dL (ref 8.4–10.5)
Glucose, Bld: 96 mg/dL (ref 70–99)
Potassium: 4.6 mEq/L (ref 3.5–5.3)

## 2011-03-29 ENCOUNTER — Other Ambulatory Visit (HOSPITAL_COMMUNITY): Payer: Self-pay | Admitting: Urology

## 2011-03-29 ENCOUNTER — Ambulatory Visit (HOSPITAL_COMMUNITY)
Admission: RE | Admit: 2011-03-29 | Discharge: 2011-03-29 | Disposition: A | Discharge status: Home / Self Care | Payer: Medicare Other | Source: Ambulatory Visit | Attending: Urology | Admitting: Urology

## 2011-03-29 DIAGNOSIS — J984 Other disorders of lung: Secondary | ICD-10-CM | POA: Insufficient documentation

## 2011-03-29 DIAGNOSIS — C649 Malignant neoplasm of unspecified kidney, except renal pelvis: Secondary | ICD-10-CM | POA: Insufficient documentation

## 2011-03-29 DIAGNOSIS — I700 Atherosclerosis of aorta: Secondary | ICD-10-CM | POA: Insufficient documentation

## 2011-03-29 DIAGNOSIS — J438 Other emphysema: Secondary | ICD-10-CM | POA: Insufficient documentation

## 2011-05-18 LAB — LIPID PANEL
Cholesterol: 166
HDL: 41
LDL Cholesterol: 106 — ABNORMAL HIGH
Total CHOL/HDL Ratio: 4
Triglycerides: 94

## 2011-05-18 LAB — IRON AND TIBC
Iron: 74
Saturation Ratios: 26
UIBC: 207

## 2011-05-18 LAB — BASIC METABOLIC PANEL
BUN: 11
CO2: 26
Calcium: 8.7
Calcium: 8.8
Calcium: 8.9
Chloride: 110
Creatinine, Ser: 0.92
Creatinine, Ser: 0.98
Creatinine, Ser: 1.03
GFR calc Af Amer: >60
GFR calc Af Amer: >60
GFR calc non Af Amer: 53 — ABNORMAL LOW
GFR calc non Af Amer: >60
Glucose, Bld: 94
Sodium: 141

## 2011-05-18 LAB — CBC
HCT: 33.9 — ABNORMAL LOW
Hemoglobin: 10.9 — ABNORMAL LOW
Hemoglobin: 11.4 — ABNORMAL LOW
MCHC: 33.6
MCHC: 34.8
MCV: 92
MCV: 94.9
RBC: 3.17 — ABNORMAL LOW
RBC: 3.4 — ABNORMAL LOW
RBC: 3.57 — ABNORMAL LOW
RDW: 13.1
RDW: 13.2
WBC: 4.6
WBC: 5

## 2011-05-18 LAB — APTT
aPTT: 27
aPTT: 97 — ABNORMAL HIGH

## 2011-05-18 LAB — HEPARIN LEVEL (UNFRACTIONATED)
Heparin Unfractionated: 0.49
Heparin Unfractionated: 0.65

## 2011-05-18 LAB — CARDIAC PANEL(CRET KIN+CKTOT+MB+TROPI)
Relative Index: INVALID
Total CK: 20
Troponin I: <0.01

## 2011-05-18 LAB — HEMOGLOBIN A1C: Mean Plasma Glucose: 103

## 2011-05-18 LAB — PROTIME-INR
INR: 1.1
Prothrombin Time: 13.6

## 2011-05-20 LAB — CARDIAC PANEL(CRET KIN+CKTOT+MB+TROPI)
CK, MB: 0.7 ng/mL (ref 0.3–4.0)
CK, MB: 0.9 ng/mL (ref 0.3–4.0)
Total CK: 46 U/L (ref 7–177)
Troponin I: 0.02 ng/mL (ref 0.00–0.06)
Troponin I: <0.01 ng/mL (ref 0.00–0.06)

## 2011-05-20 LAB — CBC
HCT: 36.4 % (ref 36.0–46.0)
HCT: 36.9 % (ref 36.0–46.0)
Hemoglobin: 11.5 g/dL — ABNORMAL LOW (ref 12.0–15.0)
Hemoglobin: 12.1 g/dL (ref 12.0–15.0)
Hemoglobin: 12.3 g/dL (ref 12.0–15.0)
MCHC: 34 g/dL (ref 30.0–36.0)
MCV: 92.8 fL (ref 78.0–100.0)
MCV: 92.8 fL (ref 78.0–100.0)
Platelets: 186 10*3/uL (ref 150–400)
Platelets: 220 10*3/uL (ref 150–400)
RBC: 3.7 MIL/uL — ABNORMAL LOW (ref 3.87–5.11)
RBC: 3.98 MIL/uL (ref 3.87–5.11)
RDW: 13.3 % (ref 11.5–15.5)
WBC: 5.2 10*3/uL (ref 4.0–10.5)
WBC: 6 10*3/uL (ref 4.0–10.5)
WBC: 6.4 10*3/uL (ref 4.0–10.5)

## 2011-05-20 LAB — DIFFERENTIAL
Basophils Absolute: 0 10*3/uL (ref 0.0–0.1)
Eosinophils Relative: 6 % — ABNORMAL HIGH (ref 0–5)
Lymphocytes Relative: 38 % (ref 12–46)
Lymphs Abs: 2.4 10*3/uL (ref 0.7–4.0)
Monocytes Absolute: 0.5 10*3/uL (ref 0.1–1.0)
Monocytes Relative: 8 % (ref 3–12)
Neutro Abs: 3.1 10*3/uL (ref 1.7–7.7)

## 2011-05-20 LAB — COMPREHENSIVE METABOLIC PANEL
AST: 54 U/L — ABNORMAL HIGH (ref 0–37)
Albumin: 3.8 g/dL (ref 3.5–5.2)
BUN: 18 mg/dL (ref 6–23)
Chloride: 101 mEq/L (ref 96–112)
Creatinine, Ser: 1.18 mg/dL (ref 0.4–1.2)
GFR calc Af Amer: 55 mL/min — ABNORMAL LOW (ref >60)
Total Bilirubin: 0.8 mg/dL (ref 0.3–1.2)
Total Protein: 6.4 g/dL (ref 6.0–8.3)

## 2011-05-20 LAB — B-NATRIURETIC PEPTIDE (CONVERTED LAB): Pro B Natriuretic peptide (BNP): 76 pg/mL (ref 0.0–100.0)

## 2011-05-20 LAB — GLUCOSE, CAPILLARY: Glucose-Capillary: 91 mg/dL (ref 70–99)

## 2011-05-20 LAB — POCT CARDIAC MARKERS
CKMB, poc: <1 ng/mL — ABNORMAL LOW (ref 1.0–8.0)
Troponin i, poc: <0.05 ng/mL (ref 0.00–0.09)

## 2011-05-20 LAB — POCT I-STAT, CHEM 8
BUN: 20 mg/dL (ref 6–23)
Calcium, Ion: 1.23 mmol/L (ref 1.12–1.32)
Chloride: 104 mEq/L (ref 96–112)
Glucose, Bld: 102 mg/dL — ABNORMAL HIGH (ref 70–99)
Potassium: 4.4 mEq/L (ref 3.5–5.1)

## 2011-05-20 LAB — PROTIME-INR: INR: 1 (ref 0.00–1.49)

## 2011-05-20 LAB — LIPID PANEL
HDL: 43 mg/dL (ref >39)
Total CHOL/HDL Ratio: 2.7 RATIO
Triglycerides: 65 mg/dL (ref <150)
VLDL: 13 mg/dL (ref 0–40)

## 2011-05-20 LAB — CK TOTAL AND CKMB (NOT AT ARMC)
CK, MB: 0.8 ng/mL (ref 0.3–4.0)
Total CK: 43 U/L (ref 7–177)

## 2011-05-20 LAB — TROPONIN I: Troponin I: 0.01 ng/mL (ref 0.00–0.06)

## 2011-05-20 LAB — HEPARIN LEVEL (UNFRACTIONATED): Heparin Unfractionated: 0.53 IU/mL (ref 0.30–0.70)

## 2011-05-20 LAB — APTT: aPTT: 31 seconds (ref 24–37)

## 2011-09-13 ENCOUNTER — Encounter: Payer: Self-pay | Admitting: Cardiology

## 2011-09-15 ENCOUNTER — Ambulatory Visit (INDEPENDENT_AMBULATORY_CARE_PROVIDER_SITE_OTHER): Payer: Medicare Other | Admitting: Cardiology

## 2011-09-15 ENCOUNTER — Encounter: Payer: Self-pay | Admitting: Cardiology

## 2011-09-15 VITALS — BP 102/62 | HR 71 | Ht 63.0 in | Wt 107.0 lb

## 2011-09-15 DIAGNOSIS — I059 Rheumatic mitral valve disease, unspecified: Secondary | ICD-10-CM

## 2011-09-15 DIAGNOSIS — D599 Acquired hemolytic anemia, unspecified: Secondary | ICD-10-CM

## 2011-09-15 DIAGNOSIS — I739 Peripheral vascular disease, unspecified: Secondary | ICD-10-CM

## 2011-09-15 DIAGNOSIS — I493 Ventricular premature depolarization: Secondary | ICD-10-CM

## 2011-09-15 DIAGNOSIS — I4949 Other premature depolarization: Secondary | ICD-10-CM

## 2011-09-15 DIAGNOSIS — C649 Malignant neoplasm of unspecified kidney, except renal pelvis: Secondary | ICD-10-CM

## 2011-09-15 DIAGNOSIS — Z951 Presence of aortocoronary bypass graft: Secondary | ICD-10-CM | POA: Insufficient documentation

## 2011-09-15 DIAGNOSIS — R9430 Abnormal result of cardiovascular function study, unspecified: Secondary | ICD-10-CM

## 2011-09-15 DIAGNOSIS — Z87898 Personal history of other specified conditions: Secondary | ICD-10-CM

## 2011-09-15 DIAGNOSIS — N189 Chronic kidney disease, unspecified: Secondary | ICD-10-CM

## 2011-09-15 DIAGNOSIS — I779 Disorder of arteries and arterioles, unspecified: Secondary | ICD-10-CM

## 2011-09-15 DIAGNOSIS — IMO0002 Reserved for concepts with insufficient information to code with codable children: Secondary | ICD-10-CM

## 2011-09-15 DIAGNOSIS — R0989 Other specified symptoms and signs involving the circulatory and respiratory systems: Secondary | ICD-10-CM

## 2011-09-15 DIAGNOSIS — R55 Syncope and collapse: Secondary | ICD-10-CM

## 2011-09-15 DIAGNOSIS — D589 Hereditary hemolytic anemia, unspecified: Secondary | ICD-10-CM

## 2011-09-15 DIAGNOSIS — Z8679 Personal history of other diseases of the circulatory system: Secondary | ICD-10-CM

## 2011-09-15 DIAGNOSIS — Z9889 Other specified postprocedural states: Secondary | ICD-10-CM

## 2011-09-15 DIAGNOSIS — I340 Nonrheumatic mitral (valve) insufficiency: Secondary | ICD-10-CM | POA: Insufficient documentation

## 2011-09-15 DIAGNOSIS — I2510 Atherosclerotic heart disease of native coronary artery without angina pectoris: Secondary | ICD-10-CM

## 2011-09-15 DIAGNOSIS — Z79899 Other long term (current) drug therapy: Secondary | ICD-10-CM

## 2011-09-15 DIAGNOSIS — Z5189 Encounter for other specified aftercare: Secondary | ICD-10-CM

## 2011-09-15 DIAGNOSIS — J449 Chronic obstructive pulmonary disease, unspecified: Secondary | ICD-10-CM | POA: Insufficient documentation

## 2011-09-15 MED ORDER — ISOSORBIDE MONONITRATE ER 30 MG PO TB24: 30.0000 mg | ORAL_TABLET | Freq: Every day | ORAL | Status: DC

## 2011-09-15 NOTE — Assessment & Plan Note (Signed)
Patient tells me her anemia has been stable.

## 2011-09-15 NOTE — Assessment & Plan Note (Signed)
There has been no recurrent syncope or presyncope. No further workup.

## 2011-09-15 NOTE — Progress Notes (Signed)
HPI  Patient is seen today for followup coronary artery disease. She was actually seen last in a regional office. I have reviewed all of those records. Two-dimensional echo was done at that time revealing an ejection fraction 55-60%. There was a "septal bounce." Patient is not having any significant chest pain. It did appear that restarting her nitrate has been helpful to her.  Allergies  Allergen Reactions  . Codeine     REACTION: hallucinate    Current Outpatient Prescriptions  Medication Sig Dispense Refill  . allopurinol (ZYLOPRIM) 100 MG tablet Take 100 mg by mouth daily.        Marland Kitchen ALPRAZolam (XANAX) 0.25 MG tablet Take 0.25 mg by mouth at bedtime as needed.        Marland Kitchen aspirin 81 MG tablet Take 81 mg by mouth daily.        . calcium carbonate (TUMS - DOSED IN MG ELEMENTAL CALCIUM) 500 MG chewable tablet Chew 2 tablets by mouth once a week.       . dicyclomine (BENTYL) 10 MG capsule Take 1 capsule by mouth Every 6 hours as needed.      . furosemide (LASIX) 40 MG tablet Take 1 tablet by mouth Daily.      . hydrocodone-acetaminophen (LORCET-HD) 5-500 MG per capsule Take 1 capsule by mouth every 6 (six) hours as needed.        . isosorbide mononitrate (IMDUR) 30 MG 24 hr tablet Take 1 tablet (30 mg total) by mouth daily.  30 tablet  6  . metoprolol succinate (TOPROL-XL) 25 MG 24 hr tablet Take 25 mg by mouth daily.       . midodrine (PROAMATINE) 2.5 MG tablet Take 2.5 mg by mouth daily.       . nitroGLYCERIN (NITROSTAT) 0.4 MG SL tablet Place 1 tablet (0.4 mg total) under the tongue every 5 (five) minutes as needed.  25 tablet  3  . omeprazole (PRILOSEC) 20 MG capsule Take 20 mg by mouth daily.        . potassium chloride SA (K-DUR,KLOR-CON) 20 MEQ tablet Take 20 mEq by mouth 2 (two) times daily.        . predniSONE (DELTASONE) 5 MG tablet Take 5 mg by mouth daily.        . sertraline (ZOLOFT) 50 MG tablet Take 50 mg by mouth daily.        Marland Kitchen spironolactone (ALDACTONE) 25 MG tablet Take 1  tablet by mouth Daily.      Marland Kitchen tiotropium (SPIRIVA) 18 MCG inhalation capsule Place 18 mcg into inhaler and inhale daily.          History   Social History  . Marital Status: Married    Spouse Name: N/A    Number of Children: N/A  . Years of Education: N/A   Occupational History  . Not on file.   Social History Main Topics  . Smoking status: Former Smoker -- 2.0 packs/day for 40 years    Types: Cigarettes    Quit date: 08/16/1997  . Smokeless tobacco: Never Used  . Alcohol Use: No  . Drug Use: No  . Sexually Active: Not on file   Other Topics Concern  . Not on file   Social History Narrative  . No narrative on file    No family history on file.  Past Medical History  Diagnosis Date  . Coronary artery disease     Catheterization November, 2009, 2 of 4 grafts patent,  . History  of prolonged Q-T interval on ECG     Intermittent  . COPD (chronic obstructive pulmonary disease)     Home O2, Severe pneumonia 2008  . Hemolytic anemia     Darovsky, Probable Coombs negative hemolytic anemia, prednisone therapy intermittent  . Drug therapy     Prednisone intermittently for anemia  . Osteoporosis   . Gout   . PAD (peripheral artery disease)     Mild, legs  . GERD (gastroesophageal reflux disease)   . PVC's (premature ventricular contractions)   . Renal carcinoma     Treated by freezing by urology, 2012  . 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 Shoulder pain     Rotator cuff  . Mitral regurgitation     Mild  . Ejection fraction     EF normal, cath, 2009 /  EF 55-60%, echo, July, 2012, mild septal bounce  . Hx of CABG     2002  . Syncope     January, 2010, probable orthostatic and volume  /  he vent recorder may, 2011, normal sinus rhythm  . Carotid artery disease     Doppler, January, 2010, mild plaque, no major stenoses  . CKD (chronic kidney disease)     Creatinine 1.4, August, 2012    Past Surgical History  Procedure Date  . Coronary  artery bypass graft   . Cardiac catheterization 2009    2/4 occluded grafts most concerning area was the ostieal stenosis of the LM . Both bypass grafts to the anterioro cirulation are occluded. The mida and distal lLAD gets some filling from the RCA.Stenosis of the LM could potentially sompromise flow to the promixmal and midportion of in the LAD. Intra coronary [place] could be considered should she continue to be symptomatic.  Tx medically for now.    ROS   Patient denies fever, chills, headache, sweats, rash, change in vision, change in hearing, chest pain, cough, nausea vomiting, urinary symptoms. All other systems are reviewed and are negative.  PHYSICAL EXAM  Patient is oriented to person time and place. Affect is normal. There is no jugulovenous distention. Lungs are clear. Respiratory effort is nonlabored. Cardiac exam reveals S1 and S2. There no clicks or significant murmurs. The abdomen is soft. There is no peripheral edema.  Filed Vitals:   09/15/11 1321  BP: 102/62  Pulse: 71  Height: 5\' 3"  (1.6 m)  Weight: 107 lb (48.535 kg)  SpO2: 97%    ASSESSMENT & PLAN

## 2011-09-15 NOTE — Patient Instructions (Signed)
Continue all current medications. Your physician wants you to follow up in: 6 months.  You will receive a reminder letter in the mail one-two months in advance.  If you don't receive a letter, please call our office to schedule the follow up appointment   

## 2011-09-15 NOTE — Assessment & Plan Note (Signed)
Coronary disease is stable. No further workup is needed at this time.

## 2012-03-27 ENCOUNTER — Ambulatory Visit (HOSPITAL_COMMUNITY)
Admission: RE | Admit: 2012-03-27 | Discharge: 2012-03-27 | Disposition: A | Discharge status: Home / Self Care | Payer: Medicare Other | Source: Ambulatory Visit | Attending: Urology | Admitting: Urology

## 2012-03-27 ENCOUNTER — Other Ambulatory Visit (HOSPITAL_COMMUNITY): Payer: Self-pay | Admitting: Urology

## 2012-03-27 DIAGNOSIS — Z951 Presence of aortocoronary bypass graft: Secondary | ICD-10-CM | POA: Insufficient documentation

## 2012-03-27 DIAGNOSIS — C649 Malignant neoplasm of unspecified kidney, except renal pelvis: Secondary | ICD-10-CM | POA: Insufficient documentation

## 2012-04-12 ENCOUNTER — Ambulatory Visit: Payer: Medicare Other | Admitting: Cardiology

## 2012-05-01 ENCOUNTER — Ambulatory Visit (INDEPENDENT_AMBULATORY_CARE_PROVIDER_SITE_OTHER): Payer: Medicare Other | Admitting: Cardiology

## 2012-05-01 ENCOUNTER — Encounter: Payer: Self-pay | Admitting: Cardiology

## 2012-05-01 VITALS — BP 117/65 | HR 74 | Ht 64.0 in | Wt 108.0 lb

## 2012-05-01 DIAGNOSIS — R55 Syncope and collapse: Secondary | ICD-10-CM

## 2012-05-01 DIAGNOSIS — I4949 Other premature depolarization: Secondary | ICD-10-CM

## 2012-05-01 DIAGNOSIS — I2510 Atherosclerotic heart disease of native coronary artery without angina pectoris: Secondary | ICD-10-CM

## 2012-05-01 DIAGNOSIS — I493 Ventricular premature depolarization: Secondary | ICD-10-CM

## 2012-05-01 MED ORDER — ISOSORBIDE MONONITRATE ER 30 MG PO TB24: 30.0000 mg | ORAL_TABLET | Freq: Every day | ORAL | Status: DC

## 2012-05-01 NOTE — Assessment & Plan Note (Signed)
There has been no evidence of presyncope or syncope. No change in therapy. She is stable. I'll see her back in 6 months.

## 2012-05-01 NOTE — Assessment & Plan Note (Signed)
Coronary disease is stable. No change in therapy. 

## 2012-05-01 NOTE — Patient Instructions (Signed)
Continue all current medications. Your physician wants you to follow up in: 6 months.  You will receive a reminder letter in the mail one-two months in advance.  If you don't receive a letter, please call our office to schedule the follow up appointment   

## 2012-05-01 NOTE — Progress Notes (Signed)
HPI  Patient is doing well. She's not having any chest pain. She's had no syncope or presyncope. She has slight soreness from an area of excess growth of the tissue at the bottom of her scar on her sternum. This is stable and needs to be followed.  Allergies  Allergen Reactions  . Codeine     REACTION: hallucinate    Current Outpatient Prescriptions  Medication Sig Dispense Refill  . allopurinol (ZYLOPRIM) 100 MG tablet Take 100 mg by mouth daily.        Marland Kitchen ALPRAZolam (XANAX) 0.25 MG tablet Take 0.25 mg by mouth at bedtime as needed.        Marland Kitchen aspirin 81 MG tablet Take 81 mg by mouth daily.        . calcium carbonate (TUMS - DOSED IN MG ELEMENTAL CALCIUM) 500 MG chewable tablet Chew 2 tablets by mouth once a week.       . furosemide (LASIX) 40 MG tablet Take 1 tablet by mouth Daily.      . hydrocodone-acetaminophen (LORCET-HD) 5-500 MG per capsule Take 1 capsule by mouth every 6 (six) hours as needed.        . isosorbide mononitrate (IMDUR) 30 MG 24 hr tablet Take 1 tablet (30 mg total) by mouth daily.  30 tablet  6  . metoprolol succinate (TOPROL-XL) 25 MG 24 hr tablet Take 25 mg by mouth daily.       . midodrine (PROAMATINE) 2.5 MG tablet Take 2.5 mg by mouth daily.       . nitroGLYCERIN (NITROSTAT) 0.4 MG SL tablet Place 1 tablet (0.4 mg total) under the tongue every 5 (five) minutes as needed.  25 tablet  3  . omeprazole (PRILOSEC) 20 MG capsule Take 20 mg by mouth daily.        . potassium chloride SA (K-DUR,KLOR-CON) 20 MEQ tablet Take 20 mEq by mouth 2 (two) times daily.        . predniSONE (DELTASONE) 5 MG tablet Take 5 mg by mouth daily.        Marland Kitchen PROAIR HFA 108 (90 BASE) MCG/ACT inhaler Inhale 1 puff into the lungs every 6 (six) hours as needed.       . sertraline (ZOLOFT) 50 MG tablet Take 50 mg by mouth daily.        Marland Kitchen spironolactone (ALDACTONE) 25 MG tablet Take 1 tablet by mouth Daily.      Marland Kitchen tiotropium (SPIRIVA) 18 MCG inhalation capsule Place 18 mcg into inhaler and inhale  daily.        . VOLTAREN 1 % GEL Apply 1 application topically as needed.         History   Social History  . Marital Status: Married    Spouse Name: N/A    Number of Children: N/A  . Years of Education: N/A   Occupational History  . Not on file.   Social History Main Topics  . Smoking status: Former Smoker -- 2.0 packs/day for 40 years    Types: Cigarettes    Quit date: 08/16/1997  . Smokeless tobacco: Never Used  . Alcohol Use: No  . Drug Use: No  . Sexually Active: Not on file   Other Topics Concern  . Not on file   Social History Narrative  . No narrative on file    No family history on file.  Past Medical History  Diagnosis Date  . Coronary artery disease     Catheterization November, 2009, 2  of 4 grafts patent,  . History of prolonged Q-T interval on ECG     Intermittent  . COPD (chronic obstructive pulmonary disease)     Home O2, Severe pneumonia 2008  . Hemolytic anemia     Darovsky, Probable Coombs negative hemolytic anemia, prednisone therapy intermittent  . Drug therapy     Prednisone intermittently for anemia  . Osteoporosis   . Gout   . PAD (peripheral artery disease)     Mild, legs  . GERD (gastroesophageal reflux disease)   . PVC's (premature ventricular contractions)   . Renal carcinoma     Treated by freezing by urology, 2012  . 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 Shoulder pain     Rotator cuff  . Mitral regurgitation     Mild  . Ejection fraction     EF normal, cath, 2009 /  EF 55-60%, echo, July, 2012, mild septal bounce  . Hx of CABG     2002  . Syncope     January, 2010, probable orthostatic and volume  /  he vent recorder may, 2011, normal sinus rhythm  . Carotid artery disease     Doppler, January, 2010, mild plaque, no major stenoses  . CKD (chronic kidney disease)     Creatinine 1.4, August, 2012    Past Surgical History  Procedure Date  . Coronary artery bypass graft   . Cardiac catheterization  2009    2/4 occluded grafts most concerning area was the ostieal stenosis of the LM . Both bypass grafts to the anterioro cirulation are occluded. The mida and distal lLAD gets some filling from the RCA.Stenosis of the LM could potentially sompromise flow to the promixmal and midportion of in the LAD. Intra coronary [place] could be considered should she continue to be symptomatic.  Tx medically for now.    ROS   Patient denies fever, chills, headache, sweats, rash, change in vision, change in hearing, chest pain, cough, nausea vomiting, urinary symptoms. All other systems are reviewed and are negative.  PHYSICAL EXAM  Patient is here with her husband. She is stable. There is no jugulovenous distention. Lungs are clear. Respiratory effort is nonlabored. Cardiac exam reveals S1 and S2. There no clicks or significant murmurs. The abdomen is soft. There is no peripheral edema.  Filed Vitals:   05/01/12 0903  BP: 117/65  Pulse: 74  Height: 5\' 4"  (1.626 m)  Weight: 108 lb (48.988 kg)   EKG is done today and reviewed by me. She has normal sinus rhythm. There is no significant change from the past.  ASSESSMENT & PLAN

## 2012-11-12 ENCOUNTER — Encounter: Payer: Self-pay | Admitting: Cardiology

## 2012-11-13 ENCOUNTER — Encounter: Payer: Self-pay | Admitting: Physician Assistant

## 2012-11-13 DIAGNOSIS — I200 Unstable angina: Secondary | ICD-10-CM

## 2012-11-16 ENCOUNTER — Encounter: Payer: Self-pay | Admitting: Cardiology

## 2012-11-16 NOTE — Progress Notes (Signed)
   The patient was hospitalized [facility] hospital at the end of March, 2014. She was seen by Dr. Percival Spanish who passed information on to me. He decided not to proceed with a nuclear scan. He felt it was best to treat her medically. However he felt that if she had recurrent symptoms that we should proceed directly to catheterization. This was made clear to the patient while she was in the hospital.  [name] November, MD

## 2012-11-28 ENCOUNTER — Encounter: Payer: Self-pay | Admitting: Cardiology

## 2012-11-28 ENCOUNTER — Ambulatory Visit (INDEPENDENT_AMBULATORY_CARE_PROVIDER_SITE_OTHER): Payer: Medicare Other | Admitting: Cardiology

## 2012-11-28 VITALS — BP 111/60 | HR 76 | Ht 63.0 in | Wt 109.8 lb

## 2012-11-28 DIAGNOSIS — I779 Disorder of arteries and arterioles, unspecified: Secondary | ICD-10-CM

## 2012-11-28 DIAGNOSIS — I2510 Atherosclerotic heart disease of native coronary artery without angina pectoris: Secondary | ICD-10-CM

## 2012-11-28 DIAGNOSIS — R55 Syncope and collapse: Secondary | ICD-10-CM

## 2012-11-28 NOTE — Assessment & Plan Note (Signed)
Patient had an episode of syncope in January, 2010. She wore an event recorder in May, 2011 and had normal sinus rhythm. She's not had any recurrent problems.

## 2012-11-28 NOTE — Progress Notes (Signed)
HPI  Patient is seen today for the followup of chest pain and coronary disease. She was recently hospitalized in March, 2014, at [facility]. She had some chest pain that went to her neck. She was advised to chew 4 baby aspirin. She did this and her pain was gone in approximately 15 minutes. She was hospitalized and there was no enzyme abnormality. She was given careful consideration by our team and it was felt that exercise testing wouldn't help. It was felt most prudent to follow her medically. However she has significant recurrent problems that followup catheterization would probably be the next best step.  Patient is here today feeling well. She has not had any recurrent symptoms.  Allergies  Allergen Reactions  . Codeine     REACTION: hallucinate    Current Outpatient Prescriptions  Medication Sig Dispense Refill  . allopurinol (ZYLOPRIM) 100 MG tablet Take 100 mg by mouth daily.        Marland Kitchen ALPRAZolam (XANAX) 0.25 MG tablet Take 0.25 mg by mouth at bedtime as needed.        Marland Kitchen aspirin 81 MG tablet Take 81 mg by mouth daily.        . calcium carbonate (TUMS - DOSED IN MG ELEMENTAL CALCIUM) 500 MG chewable tablet Chew 2 tablets by mouth as needed.       . colchicine 0.6 MG tablet Take 0.6 mg by mouth as needed.      . furosemide (LASIX) 40 MG tablet Take 1 tablet by mouth Daily.      . hydrocodone-acetaminophen (LORCET-HD) 5-500 MG per capsule Take 1 capsule by mouth every 6 (six) hours as needed.        . isosorbide mononitrate (IMDUR) 30 MG 24 hr tablet Take 1 tablet (30 mg total) by mouth daily.  30 tablet  6  . metoprolol succinate (TOPROL-XL) 25 MG 24 hr tablet Take 25 mg by mouth daily.       . midodrine (PROAMATINE) 2.5 MG tablet Take 2.5 mg by mouth daily.       . nitroGLYCERIN (NITROSTAT) 0.4 MG SL tablet Place 1 tablet (0.4 mg total) under the tongue every 5 (five) minutes as needed.  25 tablet  3  . omeprazole (PRILOSEC) 20 MG capsule Take 20 mg by mouth daily.        .  potassium chloride SA (K-DUR,KLOR-CON) 20 MEQ tablet Take 20 mEq by mouth 2 (two) times daily.        . predniSONE (DELTASONE) 5 MG tablet Take 5 mg by mouth daily.        . sertraline (ZOLOFT) 50 MG tablet Take 50 mg by mouth daily.        Marland Kitchen spironolactone (ALDACTONE) 25 MG tablet Take 1 tablet by mouth Daily.      Marland Kitchen tiotropium (SPIRIVA) 18 MCG inhalation capsule Place 18 mcg into inhaler and inhale daily.         No current facility-administered medications for this visit.    History   Social History  . Marital Status: Married    Spouse Name: N/A    Number of Children: N/A  . Years of Education: N/A   Occupational History  . Not on file.   Social History Main Topics  . Smoking status: Former Smoker -- 2.00 packs/day for 40 years    Types: Cigarettes    Quit date: 08/16/1997  . Smokeless tobacco: Never Used  . Alcohol Use: No  . Drug Use: No  .  Sexually Active: Not on file   Other Topics Concern  . Not on file   Social History Narrative  . No narrative on file    No family history on file.  Past Medical History  Diagnosis Date  . Coronary artery disease     Catheterization November, 2009, 2 of 4 grafts patent,  . History of prolonged Q-T interval on ECG     Intermittent  . COPD (chronic obstructive pulmonary disease)     Home O2, Severe pneumonia 2008  . Hemolytic anemia     Darovsky, Probable Coombs negative hemolytic anemia, prednisone therapy intermittent  . Drug therapy     Prednisone intermittently for anemia  . Osteoporosis   . Gout   . PAD (peripheral artery disease)     Mild, legs  . GERD (gastroesophageal reflux disease)   . PVC's (premature ventricular contractions)   . Renal carcinoma     Treated by freezing by urology, 2012  . 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 Shoulder pain     Rotator cuff  . Mitral regurgitation     Mild  . Ejection fraction     EF normal, cath, 2009 /  EF 55-60%, echo, July, 2012, mild septal  bounce  . Hx of CABG     2002  . Syncope     January, 2010, probable orthostatic and volume  /  he vent recorder may, 2011, normal sinus rhythm  . Carotid artery disease     Doppler, January, 2010, mild plaque, no major stenoses  . CKD (chronic kidney disease)     Creatinine 1.4, August, 2012    Past Surgical History  Procedure Laterality Date  . Coronary artery bypass graft    . Cardiac catheterization  2009    2/4 occluded grafts most concerning area was the ostieal stenosis of the LM . Both bypass grafts to the anterioro cirulation are occluded. The mida and distal lLAD gets some filling from the RCA.Stenosis of the LM could potentially sompromise flow to the promixmal and midportion of in the LAD. Intra coronary [place] could be considered should she continue to be symptomatic.  Tx medically for now.    Patient Active Problem List  Diagnosis  . GERD  . CORONARY ARTERY BYPASS GRAFT, HX OF  . Renal carcinoma  . Coronary artery disease  . History of prolonged Q-T interval on ECG  . COPD (chronic obstructive pulmonary disease)  . Hemolytic anemia  . Drug therapy  . PAD (peripheral artery disease)  . PVC's (premature ventricular contractions)  . Mitral regurgitation  . Ejection fraction  . Hx of CABG  . Syncope  . Carotid artery disease  . CKD (chronic kidney disease)    ROS   Patient denies fever, chills, headache, sweats, rash, change in vision, change in hearing, chest pain, cough, nausea or vomiting, urinary symptoms. All other systems are reviewed and are negative.  PHYSICAL EXAM  Patient is here with her husband. She is oriented to person time and place. Affect is normal. There is no jugulovenous distention. Lungs are clear. Respiratory effort is nonlabored. Cardiac exam reveals S1 and S2. There are no clicks. There is a soft systolic murmur. The abdomen is soft. There is no peripheral edema.  Filed Vitals:   11/28/12 1412  BP: 111/60  Pulse: 76  Height: 5\' 3"  (1.6 m)    Weight: 109 lb 12.8 oz (49.805 kg)     ASSESSMENT &  PLAN

## 2012-11-28 NOTE — Assessment & Plan Note (Addendum)
We know that 2 of her 4 grafts were patent by cath in 2009. No further workup at this time. I will plan to see her back in 3-4 months. If she is having recurrent symptoms I may look into more aggressive evaluation at that time.

## 2012-11-28 NOTE — Assessment & Plan Note (Signed)
She had very minor carotid disease in the past with no stenoses. She does not need a followup Doppler yet.

## 2012-11-28 NOTE — Patient Instructions (Addendum)
Your physician recommends that you schedule a follow-up appointment in: 4 months. You will receive a reminder letter in the mail in about 1-2 months reminding you to call and schedule your appointment. If you don't receive this letter, please contact our office. Your physician recommends that you continue on your current medications as directed. Please refer to the Current Medication list given to you today. 

## 2012-12-06 ENCOUNTER — Ambulatory Visit: Payer: Medicare Other | Admitting: Cardiology

## 2012-12-28 ENCOUNTER — Ambulatory Visit: Payer: Medicare Other | Admitting: Cardiology

## 2013-03-19 ENCOUNTER — Encounter: Payer: Self-pay | Admitting: Cardiology

## 2013-03-21 ENCOUNTER — Ambulatory Visit: Payer: Medicare Other | Admitting: Cardiology

## 2013-03-30 ENCOUNTER — Ambulatory Visit (HOSPITAL_COMMUNITY)
Admission: RE | Admit: 2013-03-30 | Discharge: 2013-03-30 | Disposition: A | Discharge status: Home / Self Care | Payer: Medicare Other | Source: Ambulatory Visit | Attending: Urology | Admitting: Urology

## 2013-03-30 ENCOUNTER — Other Ambulatory Visit (HOSPITAL_COMMUNITY): Payer: Self-pay | Admitting: Urology

## 2013-03-30 DIAGNOSIS — D4959 Neoplasm of unspecified behavior of other genitourinary organ: Secondary | ICD-10-CM

## 2013-03-30 DIAGNOSIS — J984 Other disorders of lung: Secondary | ICD-10-CM | POA: Insufficient documentation

## 2013-03-30 DIAGNOSIS — Z951 Presence of aortocoronary bypass graft: Secondary | ICD-10-CM | POA: Insufficient documentation

## 2013-04-06 ENCOUNTER — Ambulatory Visit (HOSPITAL_COMMUNITY)
Admission: RE | Admit: 2013-04-06 | Discharge: 2013-04-06 | Disposition: A | Discharge status: Home / Self Care | Payer: Medicare Other | Source: Ambulatory Visit | Attending: Urology | Admitting: Urology

## 2013-04-06 ENCOUNTER — Other Ambulatory Visit (HOSPITAL_COMMUNITY): Payer: Self-pay | Admitting: Urology

## 2013-04-06 ENCOUNTER — Other Ambulatory Visit (HOSPITAL_COMMUNITY): Payer: Medicare Other

## 2013-04-06 DIAGNOSIS — C649 Malignant neoplasm of unspecified kidney, except renal pelvis: Secondary | ICD-10-CM | POA: Insufficient documentation

## 2013-04-06 DIAGNOSIS — J4489 Other specified chronic obstructive pulmonary disease: Secondary | ICD-10-CM | POA: Insufficient documentation

## 2013-04-06 DIAGNOSIS — R918 Other nonspecific abnormal finding of lung field: Secondary | ICD-10-CM | POA: Insufficient documentation

## 2013-04-06 DIAGNOSIS — J449 Chronic obstructive pulmonary disease, unspecified: Secondary | ICD-10-CM | POA: Insufficient documentation

## 2013-04-06 DIAGNOSIS — Z951 Presence of aortocoronary bypass graft: Secondary | ICD-10-CM | POA: Insufficient documentation

## 2013-04-06 DIAGNOSIS — I10 Essential (primary) hypertension: Secondary | ICD-10-CM | POA: Insufficient documentation

## 2013-04-06 DIAGNOSIS — Z87891 Personal history of nicotine dependence: Secondary | ICD-10-CM | POA: Insufficient documentation

## 2013-04-17 ENCOUNTER — Telehealth: Payer: Self-pay | Admitting: Oncology

## 2013-04-17 NOTE — Telephone Encounter (Signed)
C/D 04/17/13 for 04/24/13

## 2013-04-17 NOTE — Telephone Encounter (Signed)
LVOM FOR PT TO RETURN CALL IN RE TO REFERRAL.  °

## 2013-04-17 NOTE — Telephone Encounter (Signed)
S/W PT IN RE TO NP APPT 09/04 @ 10:30 W/DR. Kellogg DX- LUNG CA  WELCOME PACKET AT REGISTRATION.

## 2013-04-19 ENCOUNTER — Ambulatory Visit: Payer: Medicare Other

## 2013-04-19 ENCOUNTER — Other Ambulatory Visit (HOSPITAL_BASED_OUTPATIENT_CLINIC_OR_DEPARTMENT_OTHER): Payer: Medicare Other | Admitting: Lab

## 2013-04-19 ENCOUNTER — Ambulatory Visit (HOSPITAL_BASED_OUTPATIENT_CLINIC_OR_DEPARTMENT_OTHER): Payer: Medicare Other | Admitting: Oncology

## 2013-04-19 ENCOUNTER — Other Ambulatory Visit: Payer: Self-pay | Admitting: Oncology

## 2013-04-19 ENCOUNTER — Telehealth: Payer: Self-pay | Admitting: Oncology

## 2013-04-19 ENCOUNTER — Encounter: Payer: Self-pay | Admitting: Oncology

## 2013-04-19 VITALS — BP 140/50 | HR 64 | Temp 97.5°F | Resp 17 | Ht 63.0 in | Wt 111.3 lb

## 2013-04-19 DIAGNOSIS — C649 Malignant neoplasm of unspecified kidney, except renal pelvis: Secondary | ICD-10-CM

## 2013-04-19 DIAGNOSIS — R911 Solitary pulmonary nodule: Secondary | ICD-10-CM

## 2013-04-19 LAB — CBC WITH DIFFERENTIAL/PLATELET
BASO%: 1.4 % (ref 0.0–2.0)
EOS%: 1.9 % (ref 0.0–7.0)
MCH: 32.2 pg (ref 25.1–34.0)
MCHC: 34.2 g/dL (ref 31.5–36.0)
MCV: 94.1 fL (ref 79.5–101.0)
MONO%: 4.9 % (ref 0.0–14.0)
NEUT#: 7.3 10*3/uL — ABNORMAL HIGH (ref 1.5–6.5)
RBC: 3.92 10*6/uL (ref 3.70–5.45)
RDW: 14.3 % (ref 11.2–14.5)

## 2013-04-19 LAB — COMPREHENSIVE METABOLIC PANEL (CC13)
AST: 13 U/L (ref 5–34)
Albumin: 3.7 g/dL (ref 3.5–5.0)
Alkaline Phosphatase: 48 U/L (ref 40–150)
Potassium: 5 mEq/L (ref 3.5–5.1)
Sodium: 140 mEq/L (ref 136–145)
Total Bilirubin: 0.7 mg/dL (ref 0.20–1.20)
Total Protein: 6.6 g/dL (ref 6.4–8.3)

## 2013-04-19 NOTE — Progress Notes (Signed)
Reason for Referral: Lung mass.   HPI: 78 year old woman with a past medical history significant for coronary artery disease and COPD referred to me for the evaluation of a lung mass. She has a history of kidney cancer that dates back to 2010. At that time, she had a biopsy that was done on 02/07/2009 of a right upper pole mass which showed a papillary renal cell carcinoma that was based on a pathology case number [id number]. She underwent laparoscopic renal cryoablation on 03/31/2009 under the care of Dr. Alinda Money. Since that time, she had been on active surveillance getting routine abdominal imaging as well as chest x-rays. Her most recent imaging studies done in August of 2014 which included a CT scan of the abdomen and pelvis which showed that she is status post radiofrequency ablation but no evidence to suggest recurrent disease. She also had a chest x-ray which showed symmetric nodular density in the lower lobes most consistent with nipple shadows and a repeat chest x-ray was recommended. That was done on 04/06/2013 which suggested a right lower lobe density and a CT scan was recommended. That was done on 8 10/12/2012 and showed a spiculated nodule measuring 1.8 cm within the superior segment of the right lower lobe that is suspicious for malignancy. Based on these findings the patient referred to me by Dr. Alinda Money for an evaluation.  Clinically, she is minimally symptomatic and have chronic symptoms of shortness of breath and dyspnea on exertion. She does use oxygen at nighttime but she still ambulates without any major difficulty. She is able to climb 2 flights of steps a day without stopping but she is short of breath at the end of her climb. She had an angina episode while ago but seems to be manageable. She follows up with Dr. Ron Parker a cardiovascular standpoint. She also had coronary artery bypass grafts done over 12 years ago Dr. Roxy Manns. She is not reporting any weight loss or appetite changes. She is  not reporting any hemoptysis or cough. Is not reporting any chest pain or abdominal distention. Does not report any genitourinary or GI symptoms. Does not report any thrombosis or bleeding at this time. She had had history of hemolytic anemia in the past but currently maintained on low-dose prednisone without any evidence to suggest relapse.   Past Medical History  Diagnosis Date  . Coronary artery disease     Catheterization November, 2009, 2 of 4 grafts patent,  . History of prolonged Q-T interval on ECG     Intermittent  . COPD (chronic obstructive pulmonary disease)     Home O2, Severe pneumonia 2008  . Hemolytic anemia     Darovsky, Probable Coombs negative hemolytic anemia, prednisone therapy intermittent  . Drug therapy     Prednisone intermittently for anemia  . Osteoporosis   . Gout   . PAD (peripheral artery disease)     Mild, legs  . GERD (gastroesophageal reflux disease)   . PVC's (premature ventricular contractions)   . Renal carcinoma     Treated by freezing by urology, 2012  . 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 Shoulder pain     Rotator cuff  . Mitral regurgitation     Mild  . Ejection fraction     EF normal, cath, 2009 /  EF 55-60%, echo, July, 2012, mild septal bounce  . Hx of CABG     2002  . Syncope     January,  2010, probable orthostatic and volume  /  he vent recorder may, 2011, normal sinus rhythm  . Carotid artery disease     Doppler, January, 2010, mild plaque, no major stenoses  . CKD (chronic kidney disease)     Creatinine 1.4, August, 2012  :  Past Surgical History  Procedure Laterality Date  . Coronary artery bypass graft    . Cardiac catheterization  2009    2/4 occluded grafts most concerning area was the ostieal stenosis of the LM . Both bypass grafts to the anterioro cirulation are occluded. The mida and distal lLAD gets some filling from the RCA.Stenosis of the LM could potentially sompromise flow to the promixmal  and midportion of in the LAD. Intra coronary [place] could be considered should she continue to be symptomatic.  Tx medically for now.  :  Current Outpatient Prescriptions  Medication Sig Dispense Refill  . allopurinol (ZYLOPRIM) 100 MG tablet Take 100 mg by mouth daily.        Marland Kitchen ALPRAZolam (XANAX) 0.25 MG tablet Take 0.25 mg by mouth at bedtime as needed.        Marland Kitchen aspirin 81 MG tablet Take 81 mg by mouth daily.        . calcium carbonate (TUMS - DOSED IN MG ELEMENTAL CALCIUM) 500 MG chewable tablet Chew 2 tablets by mouth as needed.       . CELEBREX 200 MG capsule 200 mg. Daily      . colchicine 0.6 MG tablet Take 0.6 mg by mouth as needed.      . furosemide (LASIX) 40 MG tablet Take 1 tablet by mouth Daily.      . hydrocodone-acetaminophen (LORCET-HD) 5-500 MG per capsule Take 1 capsule by mouth every 6 (six) hours as needed.        . isosorbide mononitrate (IMDUR) 30 MG 24 hr tablet Take 1 tablet (30 mg total) by mouth daily.  30 tablet  6  . metoprolol succinate (TOPROL-XL) 25 MG 24 hr tablet Take 25 mg by mouth daily.       . midodrine (PROAMATINE) 2.5 MG tablet Take 2.5 mg by mouth daily.       . nitroGLYCERIN (NITROSTAT) 0.4 MG SL tablet Place 1 tablet (0.4 mg total) under the tongue every 5 (five) minutes as needed.  25 tablet  3  . omeprazole (PRILOSEC) 20 MG capsule Take 20 mg by mouth daily.        . potassium chloride SA (K-DUR,KLOR-CON) 20 MEQ tablet Take 20 mEq by mouth 2 (two) times daily.        . predniSONE (DELTASONE) 5 MG tablet Take 5 mg by mouth daily.        . sertraline (ZOLOFT) 50 MG tablet Take 50 mg by mouth daily.        Marland Kitchen spironolactone (ALDACTONE) 25 MG tablet Take 1 tablet by mouth Daily.      Marland Kitchen tiotropium (SPIRIVA) 18 MCG inhalation capsule Place 18 mcg into inhaler and inhale daily.         No current facility-administered medications for this visit.      Allergies  Allergen Reactions  . Codeine     REACTION: hallucinate  :  No family history on  file.:  History   Social History  . Marital Status: Married    Spouse Name: N/A    Number of Children: N/A  . Years of Education: N/A   Occupational History  . Not on file.   Social  History Main Topics  . Smoking status: Former Smoker -- 2.00 packs/day for 40 years    Types: Cigarettes    Quit date: 08/16/1997  . Smokeless tobacco: Never Used  . Alcohol Use: No  . Drug Use: No  . Sexual Activity: Not on file   Other Topics Concern  . Not on file   Social History Narrative  . No narrative on file  :  Pertinent items are noted in HPI.  Exam: ECOG 1 Blood pressure 140/50, pulse 64, temperature 97.5 F (36.4 C), temperature source Oral, resp. rate 17, height 5\' 3"  (1.6 m), weight 111 lb 4.8 oz (50.485 kg), SpO2 99.00%. General appearance: alert, cooperative and appears stated age Head: Normocephalic, without obvious abnormality, atraumatic Nose: Nares normal. Septum midline. Mucosa normal. No drainage or sinus tenderness. Throat: lips, mucosa, and tongue normal; teeth and gums normal Neck: no adenopathy, no carotid bruit, no JVD, supple, symmetrical, trachea midline and thyroid not enlarged, symmetric, no tenderness/mass/nodules Resp: clear to auscultation bilaterally Chest wall: no tenderness Cardio: regular rate and rhythm, S1, S2 normal, no murmur, click, rub or gallop GI: soft, non-tender; bowel sounds normal; no masses,  no organomegaly Extremities: extremities normal, atraumatic, no cyanosis or edema Pulses: 2+ and symmetric Skin: Skin color, texture, turgor normal. No rashes or lesions Lymph nodes: Cervical, supraclavicular, and axillary nodes normal. Neurologic: Grossly normal   Recent Labs  04/19/13 1032  WBC 10.2  HGB 12.6  HCT 36.9  PLT 255   No results found for this basename: NA, K, CL, CO2, GLUCOSE, BUN, CREATININE, CALCIUM,  in the last 72 hours   Dg Chest 2 View  04/06/2013   **ADDENDUM** CREATED: 04/06/2013 15:51:49  The patient was called  back for repeat evaluation (as a part of the current study) with nipple markers in place.  On this additional view the bibasilar nodular densities are persistent although the right lower lobe density is less pronounced on the prior exam I suspect this may represent overlapping shadows.  Given the patient's history of malignancy, full evaluation with chest CT is recommended to exclude a parenchymal nodule at the right base.  The left basilar nodule is again seen and also unrelated to the left nipple but this was present year ago and likely represents a benign or chronic process.  **END ADDENDUM** SIGNED BY: Ander Gaster, M.D.  04/06/2013   *RADIOLOGY REPORT*  Clinical Data: Malignant neoplasm of the kidney.  Increased shortness of breath with some coughing.  History of COPD. Hypertension treated medically.  50 years smoking history with cessation in 1999  CHEST - 2 VIEW  Comparison: 03/30/2013 and 03/27/2012  Findings: The patient is status post median sternotomy and CABG. Heart and mediastinal contours are stable.  The lung fields demonstrate prominence of the interstitial markings compatible with underlying bronchitic change and correlating with the longstanding history of smoking.  Bilateral apical pleural scarring is stable.  Vague nodular shadows are identified overlying both lower lobes again. The area in the left lower lobe appears unchanged in comparison with prior exam from 2013 suggesting this is related to a benign process. No correlate to the abnormalities are seen on the lateral view and while these may represent either nipple shadows or summation of shadows the position is somewhat medial for nipple shadows.  As no  nipple markers were put on today's film, initial repeat PA view of the chest with nipple markers would be recommended.  If this finding remains persistent and cannot be shown to  be related to the patient's nipples then further evaluation with chest CT would be recommended.  No new  infiltrates or signs of congestive failure seen.  Overall the cardiopulmonary pattern otherwise appears stable since prior exam one week ago.  Old healed right rib fracture and thoracolumbar scoliosis is stable  IMPRESSION: Persistent bibasilar nodular densities. Repeat PA view with nipple markers is recommended for initial evaluation.  If these nodules cannot be confirmed to be related to the nipples then further evaluation with chest CT will be necessary.  No new cardiopulmonary findings seen  These results will be called to the ordering clinician or representative by the Radiologist Assistant, and communication documented in the PACS Dashboard.  Original Report Authenticated By: Ponciano Ort, M.D.   Dg Chest 2 View  03/30/2013   *RADIOLOGY REPORT*  Clinical Data: Renal neoplasm.  CHEST - 2 VIEW  Comparison: March 27, 2012.  Findings: Status post coronary artery bypass graft.  No acute pulmonary disease is noted.  Old right rib fracture is noted.  No pneumothorax or pleural effusion is noted.  Symmetric nodular densities are seen projected over both lower lobes most consistent with overlying nipple shadows. Right apical scarring is noted and unchanged.  IMPRESSION: Symmetric nodular densities are seen projected over the lower lobes most consistent with overlying nipple shadows; repeat radiograph with nipple markers is recommended for confirmation and to rule out pulmonary nodules.  No other significant abnormalities seen in the chest.   Original Report Authenticated By: Marijo Conception.,  M.D.    Assessment and Plan:   78 year old with the following issues:  1. Spiculated nodule measuring 1.8 cm within the superior segment of the right lower lobe that is suspicious for malignancy. The differential diagnosis was discussed today with the patient and her husband. This would include metastatic kidney cancer with a solitary metastasis. This could also be a new lung primary given her history of heavy  tobacco use this is a very distinct possibility. Other etiologies that would include infectious and granulomatous disease are consideration but are less likely giving the characteristic appearance of this lung mass. From a management standpoint, I feel the best approach would be surgical resection of this nodule for both therapeutic and diagnostic purposes. If she is not a surgical candidate due to cardiovascular restrictions or pulmonary restrictions but not radio therapy ablation would be the next up after a biopsy. I see no role for systemic therapy at this time. I will make the referral for evaluation by thoracic surgery and a followup after that.  2. History of kidney cancer involving the right kidney and status post laparoscopic ablation of a papillary renal cell carcinoma. There is no evidence to suggest local or regional relapse at this time an active surveillance is the way to go for the time being.

## 2013-04-19 NOTE — Progress Notes (Signed)
Checked in new pt with no financial concerns. °

## 2013-04-19 NOTE — Telephone Encounter (Signed)
gv and printed appt sched and avs for pt...emailed Dr. Alen Blew and advised to change diagnonsed to TCTS

## 2013-04-20 ENCOUNTER — Other Ambulatory Visit: Payer: Self-pay | Admitting: Oncology

## 2013-04-20 ENCOUNTER — Telehealth: Payer: Self-pay | Admitting: *Deleted

## 2013-04-20 ENCOUNTER — Telehealth: Payer: Self-pay | Admitting: Oncology

## 2013-04-20 DIAGNOSIS — R918 Other nonspecific abnormal finding of lung field: Secondary | ICD-10-CM

## 2013-04-20 NOTE — Telephone Encounter (Signed)
s.w. pt and advised on TCTs appt with Dr Nils Pyle on 9.10.14 @ 9:30am

## 2013-04-20 NOTE — Telephone Encounter (Signed)
S/w Cindy at [facility] and she will schedule appt and will call me and pt.

## 2013-04-20 NOTE — Telephone Encounter (Signed)
Call from Ericka Pontiff ( daughter ) who requested clarification and results on pt's recent pet scan. No hippa form on file for pt. Discussed with Suanne Marker pt will need to come to cancer center and fill out Hippa form. No further concerns

## 2013-04-23 ENCOUNTER — Telehealth: Payer: Self-pay | Admitting: *Deleted

## 2013-04-23 NOTE — Telephone Encounter (Signed)
Daughter Suanne Marker called requesting if "MRI verifies any malignancy to bones or anywhere else in mom's body."  Reports mom was called to have MRI done last Thursday (04-12-2013).  No order or result noted at this time.  Suanne Marker lives outside [place], [place].  Asked for a return call to [phone].  Will notify providers.

## 2013-04-24 ENCOUNTER — Ambulatory Visit: Payer: Medicare Other | Admitting: Oncology

## 2013-04-24 ENCOUNTER — Other Ambulatory Visit: Payer: Medicare Other | Admitting: Lab

## 2013-04-24 ENCOUNTER — Ambulatory Visit: Payer: Medicare Other

## 2013-04-24 ENCOUNTER — Telehealth: Payer: Self-pay | Admitting: *Deleted

## 2013-04-24 NOTE — Telephone Encounter (Signed)
Spoke with  Daughter Suanne Marker, [phone].  let her know that full body ct was good, no cancer in bones.

## 2013-04-25 ENCOUNTER — Encounter (INDEPENDENT_AMBULATORY_CARE_PROVIDER_SITE_OTHER): Payer: Medicare Other | Admitting: Cardiothoracic Surgery

## 2013-04-27 ENCOUNTER — Encounter: Payer: Self-pay | Admitting: Cardiothoracic Surgery

## 2013-04-27 ENCOUNTER — Other Ambulatory Visit: Payer: Self-pay

## 2013-04-27 ENCOUNTER — Institutional Professional Consult (permissible substitution) (INDEPENDENT_AMBULATORY_CARE_PROVIDER_SITE_OTHER): Payer: Medicare Other | Admitting: Cardiothoracic Surgery

## 2013-04-27 VITALS — BP 133/58 | HR 59 | Resp 16 | Ht 63.0 in | Wt 110.0 lb

## 2013-04-27 DIAGNOSIS — R918 Other nonspecific abnormal finding of lung field: Secondary | ICD-10-CM

## 2013-04-27 DIAGNOSIS — D381 Neoplasm of uncertain behavior of trachea, bronchus and lung: Secondary | ICD-10-CM

## 2013-04-27 DIAGNOSIS — R222 Localized swelling, mass and lump, trunk: Secondary | ICD-10-CM

## 2013-04-27 DIAGNOSIS — C649 Malignant neoplasm of unspecified kidney, except renal pelvis: Secondary | ICD-10-CM

## 2013-04-27 NOTE — Progress Notes (Signed)
PCP is Monico Blitz, MD Referring Provider is Wyatt Portela, MD  Chief Complaint  Patient presents with  . Lung Mass    Surgical eval on Lung Mass, Chest CT 04/11/2013 @ [facility]    HPI:  78 year old fragile Caucasian female with multiple significant medical problems presents for evaluation of a newly discovered 1.8 cm nodular density in the right lung-superior segment of lower lobe. Strong but remote history of smoking-quit 15 years ago.  Renal cell carcinoma treated 4 years ago with Cryo-ablation By Dr. Zenovia Jarred evidence of recurrent disease on serial CT abdomen. Recent chest x-ray showed nodular density at right lung base. Followup CT chest demonstrates a 1.8 cm suspicious spiculated nodular density superior segment right lower lobe. No other suspicious pulmonary nodules. No abnormal mediastinal adenopathy.  Patient has not smoked in 15 years. She denies any active symptoms of hemoptysis weight loss, headache change in vision.  Patient does have significant dyspnea on exertion and is on home oxygen. She has significant coronary disease status post CABG 12 years ago with recurrence-last catheterization demonstrates 50% left main stenosis with occluded grafts to the LAD and diagonal, patent grafts to the OM and RCA, EF 55%. She will see her cardiologist Dr. Ron Parker in approximately 4 weeks for routine followup. She complains of increasing dyspnea with exertion. No discrete chest pain.    Past Medical History  Diagnosis Date  . Coronary artery disease     Catheterization November, 2009, 2 of 4 grafts patent,  . History of prolonged Q-T interval on ECG     Intermittent  . COPD (chronic obstructive pulmonary disease)     Home O2, Severe pneumonia 2008  . Hemolytic anemia     Darovsky, Probable Coombs negative hemolytic anemia, prednisone therapy intermittent  . Drug therapy     Prednisone intermittently for anemia  . Osteoporosis   . Gout   . PAD (peripheral artery disease)      Mild, legs  . GERD (gastroesophageal reflux disease)   . PVC's (premature ventricular contractions)   . Renal carcinoma     Treated by freezing by urology, 2012  . 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 Shoulder pain     Rotator cuff  . Mitral regurgitation     Mild  . Ejection fraction     EF normal, cath, 2009 /  EF 55-60%, echo, July, 2012, mild septal bounce  . Hx of CABG     2002  . Syncope     January, 2010, probable orthostatic and volume  /  he vent recorder may, 2011, normal sinus rhythm  . Carotid artery disease     Doppler, January, 2010, mild plaque, no major stenoses  . CKD (chronic kidney disease)     Creatinine 1.4, August, 2012    Past Surgical History  Procedure Laterality Date  . Coronary artery bypass graft    . Cardiac catheterization  2009    2/4 occluded grafts most concerning area was the ostieal stenosis of the LM . Both bypass grafts to the anterioro cirulation are occluded. The mida and distal lLAD gets some filling from the RCA.Stenosis of the LM could potentially sompromise flow to the promixmal and midportion of in the LAD. Intra coronary [place] could be considered should she continue to be symptomatic.  Tx medically for now.    No family history on file.  Social History History  Substance Use Topics  . Smoking status: Former Smoker --  2.00 packs/day for 40 years    Types: Cigarettes    Quit date: 08/16/1997  . Smokeless tobacco: Never Used  . Alcohol Use: No    Current Outpatient Prescriptions  Medication Sig Dispense Refill  . allopurinol (ZYLOPRIM) 100 MG tablet Take 100 mg by mouth daily.        Marland Kitchen ALPRAZolam (XANAX) 0.25 MG tablet Take 0.25 mg by mouth at bedtime as needed.        Marland Kitchen aspirin 81 MG tablet Take 81 mg by mouth daily.        . calcium carbonate (TUMS - DOSED IN MG ELEMENTAL CALCIUM) 500 MG chewable tablet Chew 2 tablets by mouth as needed.       . CELEBREX 200 MG capsule 200 mg. Daily      . colchicine 0.6  MG tablet Take 0.6 mg by mouth as needed.      . furosemide (LASIX) 40 MG tablet Take 1 tablet by mouth Daily.      . hydrocodone-acetaminophen (LORCET-HD) 5-500 MG per capsule Take 1 capsule by mouth every 6 (six) hours as needed.        . isosorbide mononitrate (IMDUR) 30 MG 24 hr tablet Take 1 tablet (30 mg total) by mouth daily.  30 tablet  6  . metoprolol succinate (TOPROL-XL) 25 MG 24 hr tablet Take 25 mg by mouth daily.       . midodrine (PROAMATINE) 2.5 MG tablet Take 2.5 mg by mouth daily.       . nitroGLYCERIN (NITROSTAT) 0.4 MG SL tablet Place 1 tablet (0.4 mg total) under the tongue every 5 (five) minutes as needed.  25 tablet  3  . omeprazole (PRILOSEC) 20 MG capsule Take 20 mg by mouth daily.        . potassium chloride SA (K-DUR,KLOR-CON) 20 MEQ tablet Take 20 mEq by mouth 2 (two) times daily.        . predniSONE (DELTASONE) 5 MG tablet Take 5 mg by mouth daily.        . sertraline (ZOLOFT) 50 MG tablet Take 50 mg by mouth daily.        Marland Kitchen spironolactone (ALDACTONE) 25 MG tablet Take 1 tablet by mouth Daily.      Marland Kitchen tiotropium (SPIRIVA) 18 MCG inhalation capsule Place 18 mcg into inhaler and inhale daily.         No current facility-administered medications for this visit.    Allergies  Allergen Reactions  . Codeine     REACTION: hallucinate    Review of Systems  Gen. no weight loss or fever no new neurologic symptoms HEENT no difficulty swallowing Thorax abnormal chest x-ray and abnormal CT scan, no productive cough positive dyspnea positive home oxygen requirement for COPD- Cardiac significant CAD, 0.50 left main stenosis status post reduce CABG with 2/4 grafts open- GI no blood per no jaundice, positive history of hemangioma of liver Hematologic positive history of hemolytic anemia on chronic prednisone Neuro no history stroke history of mild carotid disease  BP 133/58  Pulse 59  Resp 16  Ht 5\' 3"  (1.6 m)  Wt 110 lb (49.896 kg)  BMI 19.49 kg/m2  SpO2  98%   Physical Exam Gen.-Fragile elderly female no acute distress dyspnea with mild exertion HEENT-normocephalic pupils good dentition adequate Neck-no palpable adenopathy no bruit JVD Thorax-scattered rhonchi -Cardiac-regular rhythm Abdomen-no palpable mass or tenderness -Extremities no edema, bilateral saphenous vein harvest scars Neuro-no focal motor deficit  Diagnostic Tests: CT scan of chest  reviewed demonstrating 1.8 cm nodule spear segment right lower lobe. COPD pattern as well.  Impression: Lung nodule possible recurrent renal cell cancer versus new bronchogenic primary carcinoma Patient not a candidate for surgical resection due to were left main stenosis, severe COPD  Plan: Proceed with PET scan and needle biopsy of right lower lobe. Best therapy probably stereotactic radiation therapy of nodule unless distant disease is detected by scan We'll see back in office following PET scan and biopsy

## 2013-05-09 ENCOUNTER — Encounter (HOSPITAL_COMMUNITY)
Admission: RE | Admit: 2013-05-09 | Discharge: 2013-05-09 | Disposition: A | Discharge status: Home / Self Care | Payer: Medicare Other | Source: Ambulatory Visit | Attending: Cardiothoracic Surgery | Admitting: Cardiothoracic Surgery

## 2013-05-09 DIAGNOSIS — D381 Neoplasm of uncertain behavior of trachea, bronchus and lung: Secondary | ICD-10-CM

## 2013-05-09 LAB — GLUCOSE, CAPILLARY: Glucose-Capillary: 100 mg/dL — ABNORMAL HIGH (ref 70–99)

## 2013-05-09 MED ORDER — FLUDEOXYGLUCOSE F - 18 (FDG) INJECTION
18.9000 | Freq: Once | INTRAVENOUS | Status: AC | PRN
  Administered 2013-05-09: 18.9 via INTRAVENOUS

## 2013-05-10 ENCOUNTER — Encounter (HOSPITAL_COMMUNITY): Payer: Self-pay | Admitting: Pharmacy Technician

## 2013-05-11 ENCOUNTER — Other Ambulatory Visit: Payer: Self-pay | Admitting: Radiology

## 2013-05-14 ENCOUNTER — Ambulatory Visit (HOSPITAL_COMMUNITY)
Admission: RE | Admit: 2013-05-14 | Discharge: 2013-05-14 | Disposition: A | Discharge status: Home / Self Care | Payer: Medicare Other | Source: Ambulatory Visit | Attending: Cardiothoracic Surgery | Admitting: Cardiothoracic Surgery

## 2013-05-14 ENCOUNTER — Encounter (HOSPITAL_COMMUNITY): Payer: Self-pay

## 2013-05-14 DIAGNOSIS — Z87891 Personal history of nicotine dependence: Secondary | ICD-10-CM | POA: Insufficient documentation

## 2013-05-14 DIAGNOSIS — Z85528 Personal history of other malignant neoplasm of kidney: Secondary | ICD-10-CM | POA: Insufficient documentation

## 2013-05-14 DIAGNOSIS — D381 Neoplasm of uncertain behavior of trachea, bronchus and lung: Secondary | ICD-10-CM

## 2013-05-14 DIAGNOSIS — R911 Solitary pulmonary nodule: Secondary | ICD-10-CM | POA: Insufficient documentation

## 2013-05-14 LAB — CBC
HCT: 39.2 % (ref 36.0–46.0)
MCH: 32.3 pg (ref 26.0–34.0)
MCHC: 35.2 g/dL (ref 30.0–36.0)
MCV: 91.8 fL (ref 78.0–100.0)
RDW: 13.9 % (ref 11.5–15.5)

## 2013-05-14 LAB — APTT: aPTT: 26 seconds (ref 24–37)

## 2013-05-14 MED ORDER — SODIUM CHLORIDE 0.9 % IV SOLN: INTRAVENOUS | Status: DC

## 2013-05-14 MED ORDER — FENTANYL CITRATE 0.05 MG/ML IJ SOLN
INTRAMUSCULAR | Status: AC
  Filled 2013-05-14: qty 4

## 2013-05-14 MED ORDER — MIDAZOLAM HCL 2 MG/2ML IJ SOLN
INTRAMUSCULAR | Status: AC | PRN
  Administered 2013-05-14: 1 mg via INTRAVENOUS

## 2013-05-14 MED ORDER — MIDAZOLAM HCL 2 MG/2ML IJ SOLN
INTRAMUSCULAR | Status: AC
  Filled 2013-05-14: qty 4

## 2013-05-14 MED ORDER — FENTANYL CITRATE 0.05 MG/ML IJ SOLN
INTRAMUSCULAR | Status: AC | PRN
  Administered 2013-05-14: 25 ug via INTRAVENOUS

## 2013-05-14 NOTE — H&P (Signed)
[name] is an 78 y.o. female.   Chief Complaint: Right lung mass biopsy Hx renal cell ca- cryoablation 2010 Follows with Dr Alinda Money annually New Rt lung mass noted 03/2013 Pet + RLL mass Scheduled now for biopsy per Dr Lucianne Lei trigt request HPI: Renal cell ca; CAD/CABG; COPD; osteoporosis; GERD; CKD  Past Medical History  Diagnosis Date  . Coronary artery disease     Catheterization November, 2009, 2 of 4 grafts patent,  . History of prolonged Q-T interval on ECG     Intermittent  . COPD (chronic obstructive pulmonary disease)     Home O2, Severe pneumonia 2008  . Hemolytic anemia     Darovsky, Probable Coombs negative hemolytic anemia, prednisone therapy intermittent  . Drug therapy     Prednisone intermittently for anemia  . Osteoporosis   . Gout   . PAD (peripheral artery disease)     Mild, legs  . GERD (gastroesophageal reflux disease)   . PVC's (premature ventricular contractions)   . Renal carcinoma     Treated by freezing by urology, 2012  . 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 Shoulder pain     Rotator cuff  . Mitral regurgitation     Mild  . Ejection fraction     EF normal, cath, 2009 /  EF 55-60%, echo, July, 2012, mild septal bounce  . Hx of CABG     2002  . Syncope     January, 2010, probable orthostatic and volume  /  he vent recorder may, 2011, normal sinus rhythm  . Carotid artery disease     Doppler, January, 2010, mild plaque, no major stenoses  . CKD (chronic kidney disease)     Creatinine 1.4, August, 2012    Past Surgical History  Procedure Laterality Date  . Coronary artery bypass graft    . Cardiac catheterization  2009    2/4 occluded grafts most concerning area was the ostieal stenosis of the LM . Both bypass grafts to the anterioro cirulation are occluded. The mida and distal lLAD gets some filling from the RCA.Stenosis of the LM could potentially sompromise flow to the promixmal and midportion of in the LAD. Intra  coronary [place] could be considered should she continue to be symptomatic.  Tx medically for now.    No family history on file. Social History:  reports that she quit smoking about 15 years ago. Her smoking use included Cigarettes. She has a 80 pack-year smoking history. She has never used smokeless tobacco. She reports that she does not drink alcohol or use illicit drugs.  Allergies:  Allergies  Allergen Reactions  . Codeine     REACTION: hallucinate     (Not in a hospital admission)  Results for orders placed during the hospital encounter of 05/14/13 (from the past 48 hour(s))  APTT     Status: None   Collection Time    05/14/13  9:49 AM      Result Value Range   aPTT 26  24 - 37 seconds  CBC     Status: None   Collection Time    05/14/13  9:49 AM      Result Value Range   WBC 7.0  4.0 - 10.5 K/uL   RBC 4.27  3.87 - 5.11 MIL/uL   Hemoglobin 13.8  12.0 - 15.0 g/dL   HCT 39.2  36.0 - 46.0 %   MCV 91.8  78.0 - 100.0  fL   MCH 32.3  26.0 - 34.0 pg   MCHC 35.2  30.0 - 36.0 g/dL   RDW 13.9  11.5 - 15.5 %   Platelets 225  150 - 400 K/uL  PROTIME-INR     Status: None   Collection Time    05/14/13  9:49 AM      Result Value Range   Prothrombin Time 12.2  11.6 - 15.2 seconds   INR 0.92  0.00 - 1.49   No results found.  Review of Systems  Constitutional: Negative for fever and weight loss.  Respiratory: Negative for cough.   Cardiovascular: Negative for chest pain.  Gastrointestinal: Negative for nausea, vomiting and abdominal pain.  Neurological: Negative for weakness and headaches.    Blood pressure 170/56, pulse 65, temperature 97.6 F (36.4 C), temperature source Oral, resp. rate 18, height 5\' 4"  (1.626 m), weight 110 lb (49.896 kg), SpO2 100.00%. Physical Exam  Constitutional: She is oriented to person, place, and time. She appears well-nourished.  Cardiovascular: Normal rate, regular rhythm and normal heart sounds.   No murmur heard. Respiratory: Effort normal and  breath sounds normal. She has no wheezes.  GI: Soft. Bowel sounds are normal. There is no tenderness.  Musculoskeletal: Normal range of motion.  Neurological: She is alert and oriented to person, place, and time. Coordination normal.  Skin: Skin is warm and dry.  Psychiatric: She has a normal mood and affect. Her behavior is normal. Judgment and thought content normal.     Assessment/Plan New RLL mass 03/2013; +PET Hx renal cell ca Scheduled for RLL mass bx now Pt aware of procedure benefits and risks and agreeable to proceed Consent signed and in chart  Elmond Poehlman A 05/14/2013, 10:26 AM

## 2013-05-14 NOTE — Progress Notes (Signed)
Assumed care of pt from Madlyn Frankel, RN. Assessment done. No changes noted.

## 2013-05-14 NOTE — Procedures (Signed)
Procedure:  CT guided core biopsy of RLL lung nodule Findings:  RLL lung nodule sampled with 18 G core device x 1 via 17 G needle.  Complicated by moderate regional hemorrhage.  No PTX.  Patient stable.  Will observe x 3 hours.

## 2013-05-14 NOTE — H&P (Signed)
Agree 

## 2013-05-14 NOTE — ED Notes (Signed)
MD talking with family. No hemoptysis at this point.

## 2013-05-14 NOTE — ED Notes (Signed)
Keep pt R side down position for 1 hour- watch for hemoptysis per MD

## 2013-05-16 ENCOUNTER — Other Ambulatory Visit: Payer: Self-pay | Admitting: *Deleted

## 2013-05-17 ENCOUNTER — Ambulatory Visit (INDEPENDENT_AMBULATORY_CARE_PROVIDER_SITE_OTHER): Payer: Medicare Other | Admitting: Cardiothoracic Surgery

## 2013-05-17 ENCOUNTER — Encounter: Payer: Self-pay | Admitting: Cardiology

## 2013-05-17 ENCOUNTER — Ambulatory Visit (INDEPENDENT_AMBULATORY_CARE_PROVIDER_SITE_OTHER): Payer: Medicare Other | Admitting: Cardiology

## 2013-05-17 ENCOUNTER — Encounter: Payer: Self-pay | Admitting: Cardiothoracic Surgery

## 2013-05-17 VITALS — BP 117/66 | HR 80 | Ht 63.0 in | Wt 109.0 lb

## 2013-05-17 VITALS — BP 107/58 | HR 80 | Resp 16 | Ht 63.0 in | Wt 110.0 lb

## 2013-05-17 DIAGNOSIS — R0989 Other specified symptoms and signs involving the circulatory and respiratory systems: Secondary | ICD-10-CM

## 2013-05-17 DIAGNOSIS — I2510 Atherosclerotic heart disease of native coronary artery without angina pectoris: Secondary | ICD-10-CM

## 2013-05-17 DIAGNOSIS — D381 Neoplasm of uncertain behavior of trachea, bronchus and lung: Secondary | ICD-10-CM

## 2013-05-17 DIAGNOSIS — R9430 Abnormal result of cardiovascular function study, unspecified: Secondary | ICD-10-CM

## 2013-05-17 NOTE — Progress Notes (Signed)
PCP is Monico Blitz, MD Referring Provider is Wyatt Portela, MD  Chief Complaint  Patient presents with  . Lung Lesion    discuss NB results and PET    HPI:  patient returns for discussion of transthoracic needle biopsy of 1.8 cm nodule superior segment right lower lobe. This has very minimal activity on PET scan-2.4 SUV. There are no hypermetabolic mediastinal nodes. No evidence of renal cell carcinoma recurrence in the abdomen. Patient had no complications from biopsy other than some bleeding in one episode of hemoptysis. She has a history of right renal cell cancer treated with cryoablation   Pathology of the needle biopsy was scant material normal lung tissue  Past Medical History  Diagnosis Date  . Coronary artery disease     Catheterization November, 2009, 2 of 4 grafts patent,  . History of prolonged Q-T interval on ECG     Intermittent  . COPD (chronic obstructive pulmonary disease)     Home O2, Severe pneumonia 2008  . Hemolytic anemia     Darovsky, Probable Coombs negative hemolytic anemia, prednisone therapy intermittent  . Drug therapy     Prednisone intermittently for anemia  . Osteoporosis   . Gout   . PAD (peripheral artery disease)     Mild, legs  . GERD (gastroesophageal reflux disease)   . PVC's (premature ventricular contractions)   . Renal carcinoma     Treated by freezing by urology, 2012  . 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 Shoulder pain     Rotator cuff  . Mitral regurgitation     Mild  . Ejection fraction     EF normal, cath, 2009 /  EF 55-60%, echo, July, 2012, mild septal bounce  . Hx of CABG     2002  . Syncope     January, 2010, probable orthostatic and volume  /  he vent recorder may, 2011, normal sinus rhythm  . Carotid artery disease     Doppler, January, 2010, mild plaque, no major stenoses  . CKD (chronic kidney disease)     Creatinine 1.4, August, 2012    Past Surgical History  Procedure Laterality Date   . Coronary artery bypass graft    . Cardiac catheterization  2009    2/4 occluded grafts most concerning area was the ostieal stenosis of the LM . Both bypass grafts to the anterioro cirulation are occluded. The mida and distal lLAD gets some filling from the RCA.Stenosis of the LM could potentially sompromise flow to the promixmal and midportion of in the LAD. Intra coronary [place] could be considered should she continue to be symptomatic.  Tx medically for now.    No family history on file.  Social History History  Substance Use Topics  . Smoking status: Former Smoker -- 2.00 packs/day for 40 years    Types: Cigarettes    Quit date: 08/16/1997  . Smokeless tobacco: Never Used  . Alcohol Use: No    Current Outpatient Prescriptions  Medication Sig Dispense Refill  . allopurinol (ZYLOPRIM) 100 MG tablet Take 100 mg by mouth daily.       Marland Kitchen ALPRAZolam (XANAX) 0.25 MG tablet Take 0.25 mg by mouth at bedtime as needed for sleep.       . calcium carbonate (TUMS - DOSED IN MG ELEMENTAL CALCIUM) 500 MG chewable tablet Chew 2 tablets by mouth as needed for heartburn.       . CELEBREX 200 MG  capsule Take 200 mg by mouth daily. Daily      . colchicine 0.6 MG tablet Take 0.6 mg by mouth as needed (for gout).       . furosemide (LASIX) 40 MG tablet Take 40 mg by mouth Daily.       . isosorbide mononitrate (IMDUR) 120 MG 24 hr tablet Take 120 mg by mouth daily.      . metoprolol succinate (TOPROL-XL) 25 MG 24 hr tablet Take 1 tablet in the morning and 1/2 tablet in the evening      . midodrine (PROAMATINE) 2.5 MG tablet Take 2.5 mg by mouth daily.       . nitroGLYCERIN (NITROSTAT) 0.4 MG SL tablet Place 1 tablet (0.4 mg total) under the tongue every 5 (five) minutes as needed.  25 tablet  3  . potassium chloride SA (K-DUR,KLOR-CON) 20 MEQ tablet Take 20 mEq by mouth 2 (two) times daily.        . predniSONE (DELTASONE) 5 MG tablet Take 5 mg by mouth daily.        . sertraline (ZOLOFT) 50 MG tablet Take  50 mg by mouth daily.        Marland Kitchen spironolactone (ALDACTONE) 25 MG tablet Take 25 mg by mouth Daily.       Marland Kitchen tiotropium (SPIRIVA) 18 MCG inhalation capsule Place 18 mcg into inhaler and inhale daily.         No current facility-administered medications for this visit.    Allergies  Allergen Reactions  . Codeine     REACTION: hallucinate    Review of Systems no symptoms from lung biopsy   BP 107/58  Pulse 80  Resp 16  Ht 5\' 3"  (1.6 m)  Wt 110 lb (49.896 kg)  BMI 19.49 kg/m2  SpO2 97% Physical Exam Chronically ill diminished breath sounds   slight bruise in right side back from needle biopsy  Diagnostic Tests: CT needle biopsy reviewed pathology report reviewed and given to patient showing normal lung tissue, no cancer .   Impression negative needle biopsy of a small 1.8 cm nodular density spear segment right lower lobe. Patient will be given a Z-Pak. We will arrange for a followup CT scan of the chest in 4 months. The patient understands that because the biopsy is negative that she may still have a small lung cancer and that if thenodular density increases in size over time a repeat biopsy will be discussed with patient.  Plan return in 4 months with CT chest

## 2013-05-17 NOTE — Assessment & Plan Note (Signed)
The patient is not having any significant chest pain. Catheterization in 2009 revealed that 2 of her 4 grafts were patent. There was no need for an intervention at that time. She has not had any type of cardiac stress testing since that time. If the patient were to need moderate or high-risk surgery, it might be most prudent to proceed with a nuclear stress test. I feel this is not a necessary test unless she needs some type of significant procedure.

## 2013-05-17 NOTE — Patient Instructions (Addendum)

## 2013-05-17 NOTE — Progress Notes (Signed)
HPI  Patient is seen today to followup her cardiac status. She appears to be stable at this time. She has had biopsies and is to see Dr. Prescott Gum later this afternoon.  The patient had been hospitalized at [facility] on March, 2014. She had some chest pain. Enzymes were normal. It was decided that she should be followed clinically. It was felt that if she had significant recurring problems, that catheterization would be the most likely next step. She has not been having any significant chest pain since then.  Allergies  Allergen Reactions  . Codeine     REACTION: hallucinate    Current Outpatient Prescriptions  Medication Sig Dispense Refill  . allopurinol (ZYLOPRIM) 100 MG tablet Take 100 mg by mouth daily.       Marland Kitchen ALPRAZolam (XANAX) 0.25 MG tablet Take 0.25 mg by mouth at bedtime as needed for sleep.       . calcium carbonate (TUMS - DOSED IN MG ELEMENTAL CALCIUM) 500 MG chewable tablet Chew 2 tablets by mouth as needed for heartburn.       . CELEBREX 200 MG capsule Take 200 mg by mouth daily. Daily      . colchicine 0.6 MG tablet Take 0.6 mg by mouth as needed (for gout).       . furosemide (LASIX) 40 MG tablet Take 40 mg by mouth Daily.       . isosorbide mononitrate (IMDUR) 120 MG 24 hr tablet Take 120 mg by mouth daily.      . metoprolol succinate (TOPROL-XL) 25 MG 24 hr tablet Take 1 tablet in the morning and 1/2 tablet in the evening      . midodrine (PROAMATINE) 2.5 MG tablet Take 2.5 mg by mouth daily.       . nitroGLYCERIN (NITROSTAT) 0.4 MG SL tablet Place 1 tablet (0.4 mg total) under the tongue every 5 (five) minutes as needed.  25 tablet  3  . potassium chloride SA (K-DUR,KLOR-CON) 20 MEQ tablet Take 20 mEq by mouth 2 (two) times daily.        . predniSONE (DELTASONE) 5 MG tablet Take 5 mg by mouth daily.        . sertraline (ZOLOFT) 50 MG tablet Take 50 mg by mouth daily.        Marland Kitchen spironolactone (ALDACTONE) 25 MG tablet Take 25 mg by mouth Daily.       Marland Kitchen tiotropium  (SPIRIVA) 18 MCG inhalation capsule Place 18 mcg into inhaler and inhale daily.         No current facility-administered medications for this visit.    History   Social History  . Marital Status: Married    Spouse Name: N/A    Number of Children: N/A  . Years of Education: N/A   Occupational History  . Not on file.   Social History Main Topics  . Smoking status: Former Smoker -- 2.00 packs/day for 40 years    Types: Cigarettes    Quit date: 08/16/1997  . Smokeless tobacco: Never Used  . Alcohol Use: No  . Drug Use: No  . Sexual Activity: Not on file   Other Topics Concern  . Not on file   Social History Narrative  . No narrative on file    No family history on file.  Past Medical History  Diagnosis Date  . Coronary artery disease     Catheterization November, 2009, 2 of 4 grafts patent,  . History of prolonged Q-T interval on  ECG     Intermittent  . COPD (chronic obstructive pulmonary disease)     Home O2, Severe pneumonia 2008  . Hemolytic anemia     Darovsky, Probable Coombs negative hemolytic anemia, prednisone therapy intermittent  . Drug therapy     Prednisone intermittently for anemia  . Osteoporosis   . Gout   . PAD (peripheral artery disease)     Mild, legs  . GERD (gastroesophageal reflux disease)   . PVC's (premature ventricular contractions)   . Renal carcinoma     Treated by freezing by urology, 2012  . 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 Shoulder pain     Rotator cuff  . Mitral regurgitation     Mild  . Ejection fraction     EF normal, cath, 2009 /  EF 55-60%, echo, July, 2012, mild septal bounce  . Hx of CABG     2002  . Syncope     January, 2010, probable orthostatic and volume  /  he vent recorder may, 2011, normal sinus rhythm  . Carotid artery disease     Doppler, January, 2010, mild plaque, no major stenoses  . CKD (chronic kidney disease)     Creatinine 1.4, August, 2012    Past Surgical History    Procedure Laterality Date  . Coronary artery bypass graft    . Cardiac catheterization  2009    2/4 occluded grafts most concerning area was the ostieal stenosis of the LM . Both bypass grafts to the anterioro cirulation are occluded. The mida and distal lLAD gets some filling from the RCA.Stenosis of the LM could potentially sompromise flow to the promixmal and midportion of in the LAD. Intra coronary [place] could be considered should she continue to be symptomatic.  Tx medically for now.    Patient Active Problem List   Diagnosis Date Noted  . Renal carcinoma   . Coronary artery disease   . History of prolonged Q-T interval on ECG   . COPD (chronic obstructive pulmonary disease)   . Hemolytic anemia   . Drug therapy   . PAD (peripheral artery disease)   . PVC's (premature ventricular contractions)   . Mitral regurgitation   . Ejection fraction   . Hx of CABG   . Syncope   . Carotid artery disease   . CKD (chronic kidney disease)   . GERD 01/06/2010    ROS   Patient denies fever, chills, headache, sweats, rash, change in vision, change in hearing, chest pain, cough, nausea or vomiting, urinary symptoms. All other systems are reviewed and are negative.  PHYSICAL EXAM   Patient is oriented to person time and place. Affect is normal. There is no jugulovenous distention. Lungs reveal scattered rhonchi. Cardiac exam reveals S1 and S2. Abdomen is soft. There is no peripheral edema.  Filed Vitals:   05/17/13 1314  BP: 117/66  Pulse: 80  Height: 5\' 3"  (1.6 m)  Weight: 109 lb (49.442 kg)     ASSESSMENT & PLAN

## 2013-05-17 NOTE — Assessment & Plan Note (Signed)
The patient's ejection fraction was 55-60% by echo, July, 2012.

## 2013-06-19 ENCOUNTER — Ambulatory Visit: Payer: Medicare Other | Admitting: Oncology

## 2013-06-21 ENCOUNTER — Other Ambulatory Visit: Payer: Self-pay

## 2013-09-18 ENCOUNTER — Other Ambulatory Visit: Payer: Self-pay | Admitting: *Deleted

## 2013-09-18 DIAGNOSIS — D381 Neoplasm of uncertain behavior of trachea, bronchus and lung: Secondary | ICD-10-CM

## 2013-10-10 ENCOUNTER — Encounter: Payer: Self-pay | Admitting: Cardiothoracic Surgery

## 2013-10-10 ENCOUNTER — Ambulatory Visit
Admission: RE | Admit: 2013-10-10 | Discharge: 2013-10-10 | Disposition: A | Discharge status: Home / Self Care | Payer: Medicare Other | Source: Ambulatory Visit | Attending: Cardiothoracic Surgery | Admitting: Cardiothoracic Surgery

## 2013-10-10 ENCOUNTER — Ambulatory Visit (INDEPENDENT_AMBULATORY_CARE_PROVIDER_SITE_OTHER): Payer: Medicare Other | Admitting: Cardiothoracic Surgery

## 2013-10-10 VITALS — BP 103/43 | HR 76 | Resp 20 | Ht 63.0 in | Wt 110.0 lb

## 2013-10-10 DIAGNOSIS — D381 Neoplasm of uncertain behavior of trachea, bronchus and lung: Secondary | ICD-10-CM

## 2013-10-10 DIAGNOSIS — Z9889 Other specified postprocedural states: Secondary | ICD-10-CM

## 2013-10-10 NOTE — Progress Notes (Signed)
PCP is Monico Blitz, MD Referring Provider is Monico Blitz, MD  Chief Complaint  Patient presents with  . Lung Lesion    4 month f/u with Chest CT    HPI: 4 month followup a day 1.8 cm density superior segment right lower lobe evaluated this past fall with needle biopsy showing inflammatory  changes. She returns now with a four-month followup CT scan of the chest which shows almost complete resolution of the density in the superior segment right lower lobe. She has no palmar he symptoms. She did have the influenza virus this winter for which she has recovered. She is a nonsmoker.   Past Medical History  Diagnosis Date  . Coronary artery disease     Catheterization November, 2009, 2 of 4 grafts patent,  . History of prolonged Q-T interval on ECG     Intermittent  . COPD (chronic obstructive pulmonary disease)     Home O2, Severe pneumonia 2008  . Hemolytic anemia     Darovsky, Probable Coombs negative hemolytic anemia, prednisone therapy intermittent  . Drug therapy     Prednisone intermittently for anemia  . Osteoporosis   . Gout   . PAD (peripheral artery disease)     Mild, legs  . GERD (gastroesophageal reflux disease)   . PVC's (premature ventricular contractions)   . Renal carcinoma     Treated by freezing by urology, 2012  . 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 Shoulder pain     Rotator cuff  . Mitral regurgitation     Mild  . Ejection fraction     EF normal, cath, 2009 /  EF 55-60%, echo, July, 2012, mild septal bounce  . Hx of CABG     2002  . Syncope     January, 2010, probable orthostatic and volume  /  he vent recorder may, 2011, normal sinus rhythm  . Carotid artery disease     Doppler, January, 2010, mild plaque, no major stenoses  . CKD (chronic kidney disease)     Creatinine 1.4, August, 2012    Past Surgical History  Procedure Laterality Date  . Coronary artery bypass graft    . Cardiac catheterization  2009    2/4 occluded  grafts most concerning area was the ostieal stenosis of the LM . Both bypass grafts to the anterioro cirulation are occluded. The mida and distal lLAD gets some filling from the RCA.Stenosis of the LM could potentially sompromise flow to the promixmal and midportion of in the LAD. Intra coronary [place] could be considered should she continue to be symptomatic.  Tx medically for now.    No family history on file.  Social History History  Substance Use Topics  . Smoking status: Former Smoker -- 2.00 packs/day for 40 years    Types: Cigarettes    Quit date: 08/16/1997  . Smokeless tobacco: Never Used  . Alcohol Use: No    Current Outpatient Prescriptions  Medication Sig Dispense Refill  . allopurinol (ZYLOPRIM) 100 MG tablet Take 100 mg by mouth daily.       . calcium carbonate (TUMS - DOSED IN MG ELEMENTAL CALCIUM) 500 MG chewable tablet Chew 2 tablets by mouth as needed for heartburn.       . CELEBREX 200 MG capsule Take 200 mg by mouth daily. Daily      . colchicine 0.6 MG tablet Take 0.6 mg by mouth as needed (for gout).       Marland Kitchen  furosemide (LASIX) 40 MG tablet Take 40 mg by mouth Daily.       . isosorbide mononitrate (IMDUR) 120 MG 24 hr tablet Take 120 mg by mouth daily.      . metoprolol succinate (TOPROL-XL) 25 MG 24 hr tablet Take 1 tablet in the morning and 1/2 tablet in the evening      . midodrine (PROAMATINE) 2.5 MG tablet Take 2.5 mg by mouth daily.       . nitroGLYCERIN (NITROSTAT) 0.4 MG SL tablet Place 1 tablet (0.4 mg total) under the tongue every 5 (five) minutes as needed.  25 tablet  3  . potassium chloride SA (K-DUR,KLOR-CON) 20 MEQ tablet Take 20 mEq by mouth 2 (two) times daily.        . predniSONE (DELTASONE) 5 MG tablet Take 5 mg by mouth daily.        . sertraline (ZOLOFT) 50 MG tablet Take 50 mg by mouth daily.        Marland Kitchen spironolactone (ALDACTONE) 25 MG tablet Take 25 mg by mouth Daily.       Marland Kitchen tiotropium (SPIRIVA) 18 MCG inhalation capsule Place 18 mcg into inhaler  and inhale daily.         No current facility-administered medications for this visit.    Allergies  Allergen Reactions  . Codeine     REACTION: hallucinate    Review of Systems no complaints other than pain in her left rotator cuff which has been chronic and evaluated by orthopedics  BP 103/43  Pulse 76  Resp 20  Ht 5\' 3"  (1.6 m)  Wt 110 lb (49.896 kg)  BMI 19.49 kg/m2  SpO2 95% Physical Exam Alert and comfortable No neck adenopathy Lungs clear Heart rate regular  Diagnostic Tests: CT scan of chest reviewed showing almost complete resolution of the density and severe segment right lower lobe. This is clearly inflammatory nonmalignant. No further followup needed.  Impression: Resolution of inflammatory density right lower lobe  Plan: No further serial CT scans needed. Patient will return as needed for thoracic surgical evaluation.

## 2015-01-31 ENCOUNTER — Ambulatory Visit (INDEPENDENT_AMBULATORY_CARE_PROVIDER_SITE_OTHER): Payer: Medicare Other | Admitting: Internal Medicine

## 2015-01-31 ENCOUNTER — Encounter: Payer: Self-pay | Admitting: Internal Medicine

## 2015-01-31 ENCOUNTER — Ambulatory Visit (INDEPENDENT_AMBULATORY_CARE_PROVIDER_SITE_OTHER)
Admission: RE | Admit: 2015-01-31 | Discharge: 2015-01-31 | Disposition: A | Discharge status: Home / Self Care | Payer: Medicare Other | Source: Ambulatory Visit | Attending: Internal Medicine | Admitting: Internal Medicine

## 2015-01-31 ENCOUNTER — Other Ambulatory Visit (INDEPENDENT_AMBULATORY_CARE_PROVIDER_SITE_OTHER): Payer: Medicare Other

## 2015-01-31 VITALS — BP 110/60 | HR 70 | Ht 63.0 in | Wt 104.0 lb

## 2015-01-31 DIAGNOSIS — R05 Cough: Secondary | ICD-10-CM | POA: Diagnosis not present

## 2015-01-31 DIAGNOSIS — R0600 Dyspnea, unspecified: Secondary | ICD-10-CM

## 2015-01-31 DIAGNOSIS — J449 Chronic obstructive pulmonary disease, unspecified: Secondary | ICD-10-CM

## 2015-01-31 DIAGNOSIS — R058 Other specified cough: Secondary | ICD-10-CM

## 2015-01-31 DIAGNOSIS — N183 Chronic kidney disease, stage 3 (moderate): Secondary | ICD-10-CM | POA: Diagnosis not present

## 2015-01-31 LAB — CBC WITH DIFFERENTIAL/PLATELET
BASOS ABS: 0.1 10*3/uL (ref 0.0–0.1)
Basophils Relative: 1.2 % (ref 0.0–3.0)
EOS ABS: 0.2 10*3/uL (ref 0.0–0.7)
Eosinophils Relative: 2.8 % (ref 0.0–5.0)
HEMATOCRIT: 34.5 % — AB (ref 36.0–46.0)
Hemoglobin: 11.2 g/dL — ABNORMAL LOW (ref 12.0–15.0)
LYMPHS ABS: 2.2 10*3/uL (ref 0.7–4.0)
Lymphocytes Relative: 26.9 % (ref 12.0–46.0)
MCHC: 32.6 g/dL (ref 30.0–36.0)
MCV: 95.8 fl (ref 78.0–100.0)
MONO ABS: 0.4 10*3/uL (ref 0.1–1.0)
MONOS PCT: 4.9 % (ref 3.0–12.0)
Neutro Abs: 5.2 10*3/uL (ref 1.4–7.7)
Neutrophils Relative %: 64.2 % (ref 43.0–77.0)
PLATELETS: 369 10*3/uL (ref 150.0–400.0)
RBC: 3.6 Mil/uL — ABNORMAL LOW (ref 3.87–5.11)
RDW: 16.4 % — AB (ref 11.5–15.5)
WBC: 8.1 10*3/uL (ref 4.0–10.5)

## 2015-01-31 LAB — BASIC METABOLIC PANEL
BUN: 40 mg/dL — ABNORMAL HIGH (ref 6–23)
CO2: 17 mEq/L — ABNORMAL LOW (ref 19–32)
Calcium: 9.3 mg/dL (ref 8.4–10.5)
Chloride: 110 mEq/L (ref 96–112)
Creatinine, Ser: 1.79 mg/dL — ABNORMAL HIGH (ref 0.40–1.20)
GFR: 29.08 mL/min — ABNORMAL LOW (ref >60.00)
Glucose, Bld: 123 mg/dL — ABNORMAL HIGH (ref 70–99)
Potassium: 6.7 mEq/L (ref 3.5–5.1)
Sodium: 133 mEq/L — ABNORMAL LOW (ref 135–145)

## 2015-01-31 LAB — BRAIN NATRIURETIC PEPTIDE: PRO B NATRI PEPTIDE: 130 pg/mL — AB (ref 0.0–100.0)

## 2015-01-31 LAB — TSH: TSH: 1.31 u[IU]/mL (ref 0.35–4.50)

## 2015-01-31 NOTE — Progress Notes (Signed)
Subjective:    Patient ID: [name], female    DOB: 13-May-1935,    MRN: [id number]  HPI  30 yowf quit smoking late 90's no resp problems until cabg 2002 thinks noct 02 started then  / breathing never the same since but much worse since 1st of the 2016 assoc with dry hacking referred to pulmonary clinic 6/17/16Dr  Clevenger   01/31/2015 1st [facility] office visit/ [name]   Chief Complaint  Patient presents with  . Pulmonary Consult    Referred by Dr Hamilton Capri. Pt c/o cough and SOB "for a long time"- worse x 6 months to the point of being out of breath just walking from room to room at home. Her cough is prod at times with minimal clear sputum.   abruplty worse sob  x 6 months day > noct  Dry > wet on spiriva no better. Assoc with dry daytime  cough  hb  /hoarsenss not apparently on ACEi but very shaky on details of care. Has neb but rarely uses it ? May help some/ doesn't know what's in it.   No obvious day to day or daytime variabilty or assoc   cp or chest tightness, subjective wheeze overt sinus  symptoms. No unusual exp hx or h/o childhood pna/ asthma or knowledge of premature birth.  Sleeping ok without nocturnal  or early am exacerbation  of respiratory  c/o's or need for noct saba. Also denies any obvious fluctuation of symptoms with weather or environmental changes or other aggravating or alleviating factors except as outlined above   Current Medications, Allergies, Complete Past Medical History, Past Surgical History, Family History, and Social History were reviewed in [employer] record.  .  Review of Systems  Constitutional: Negative for fever, chills and unexpected weight change.  HENT: Positive for sneezing and sore throat. Negative for congestion, dental problem, ear pain, nosebleeds, postnasal drip, rhinorrhea, sinus pressure, trouble swallowing and voice change.   Eyes: Negative for visual disturbance.  Respiratory: Positive for cough  and shortness of breath. Negative for choking.   Cardiovascular: Negative for chest pain and leg swelling.  Gastrointestinal: Negative for vomiting, abdominal pain and diarrhea.  Genitourinary: Negative for difficulty urinating.       Acid heartburn Indigestion  Musculoskeletal: Negative for arthralgias.  Skin: Negative for rash.  Neurological: Positive for headaches. Negative for tremors and syncope.  Hematological: Does not bruise/bleed easily.       Objective:   Physical Exam  amb wf very hoarse  Wt Readings from Last 3 Encounters:  01/31/15 104 lb (47.174 kg)  10/10/13 110 lb (49.896 kg)  05/17/13 110 lb (49.896 kg)    Vital signs reviewed   HEENT:   Edentulous , nl turbinates, and orophanx. Nl external ear canals without cough reflex   NECK :  without JVD/Nodes/TM/ nl carotid upstrokes bilaterally   LUNGS: no acc muscle use, clear to A and P bilaterally without cough on insp or exp maneuvers   CV:  RRR  no s3 or murmur or increase in P2, no edema   ABD:  soft and nontender with nl excursion in the supine position. No bruits or organomegaly, bowel sounds nl  MS:  warm without deformities, calf tenderness, cyanosis or clubbing  SKIN: warm and dry without lesions    NEURO:  alert, approp, no deficits     I personally reviewed images and agree with radiology impression as follows:  CXR:  01/31/15 Emphysema with pulmonary arterial  hypertension. Progressive interstitial disease which is probably bronchitis superimposed on chronic lung disease.   Labs ordered/ reviewed:  Lab 01/31/15 1551  NA 133*  K 6.7*  CL 110  CO2 17*  BUN 40*  CREATININE 1.79*  GLUCOSE 123*   Lab 01/31/15 1551  HGB 11.2*  HCT 34.5*  WBC 8.1  PLT 369.0     Lab Results  Component Value Date   TSH 1.31 01/31/2015     Lab Results  Component Value Date   PROBNP 130.0* 01/31/2015       Assessment & Plan:

## 2015-01-31 NOTE — Patient Instructions (Addendum)
Omeprazole (prilosec) 20 mg   Take 2pills    30-60 min before first meal of the day and Pepcid (famotidine)  20 mg one @  bedtime until return to office - this is the best way to tell whether stomach acid is contributing to your problem.    Try off the spiriva and just use your nebulizer up to every 4 hours if can't catch your breath at rest  GERD (REFLUX)  is an extremely common cause of respiratory symptoms just like yours , many times with no obvious heartburn at all.    It can be treated with medication, but also with lifestyle changes including elevation of the head of your bed (ideally with 6 inch  bed blocks),  Smoking cessation, avoidance of late meals, excessive alcohol, and avoid fatty foods, chocolate, peppermint, colas, red wine, and acidic juices such as orange juice.  NO MINT OR MENTHOL PRODUCTS SO NO COUGH DROPS  USE SUGARLESS CANDY INSTEAD (Jolley ranchers or Stover's or Life Savers) or even ice chips will also do - the key is to swallow to prevent all throat clearing. NO OIL BASED VITAMINS - use powdered substitutes.    Please remember to go to the lab and x-ray department downstairs for your tests - we will call you with the results when they are available.  See Tammy NP w/in 2 weeks with all your medications, even over the counter meds, separated in two separate bags, the ones you take no matter what vs the ones you stop once you feel better and take only as needed when you feel you need them.   Tammy  will generate for you a new user friendly medication calendar that will put [place] all on the same page re: your medication use.     Without this process, it simply isn't possible to assure that we are providing  your outpatient care  with  the attention to detail we feel you deserve.   If we cannot assure that you're getting that kind of care,  then we cannot manage your problem effectively from this clinic.  Once you have seen Tammy and we are sure that we're all on the same page  with your medication use she will arrange follow up with me.

## 2015-02-01 ENCOUNTER — Encounter: Payer: Self-pay | Admitting: Internal Medicine

## 2015-02-01 NOTE — Assessment & Plan Note (Signed)
-  01/31/2015  Walked RA x 3 laps @ 185 ft each stopped due to sob with sats 89/ slow pace but "much farther than I usually go"   Clearly multifactorial : met acidosis/ copd/ ? Vcd/ ? ild > will see back for full pfts after correcting immediate problem

## 2015-02-01 NOTE — Assessment & Plan Note (Signed)
-  quit smoking late 1990s - noct 02 since ? 2002 post cabg - spirometry 01/31/15 FEV1  1.20 (63%) ratio 68  - trial off spirometry due to hoarsness 01/31/15   When respiratory symptoms begin or become refractory well after a patient reports complete smoking cessation,  Especially when this wasn't the case while they were smoking, a red flag is raised based on the work of Dr Fletcher which states:  if you quit smoking when your best day FEV1 is still well preserved it is highly unlikely you will progress to severe disease.  That is to say, once the smoking stops,  the symptoms should not suddenly erupt or markedly worsen.  If so, the differential diagnosis should include  obesity/deconditioning,  LPR/Reflux/Aspiration syndromes,  occult CHF, or  especially side effect of medications commonly used in this population.    Hoarseness this severe is either due to spiriva/ gerd or both > try off spiriva/ max rx for gerd  Met acidosis is prob from cri/ aldactone > see CRI   I had an extended discussion with the patient reviewing all relevant studies completed to date and  lasting 40 m Each maintenance medication was reviewed in detail including most importantly the difference between maintenance and as needed and under what circumstances the prns are to be used.  Please see instructions for details which were reviewed in writing and the patient given a copy.   Struggling with med reconciliation .-  To keep things simple, I have asked the patient to first separate medicines that are perceived as maintenance, that is to be taken daily "no matter what", from those medicines that are taken on only on an as-needed basis and I have given the patient examples of both, and then return to see our NP to generate a  detailed  medication calendar which should be followed until the next physician sees the patient and updates it.     

## 2015-02-01 NOTE — Assessment & Plan Note (Signed)
Classic Upper airway cough syndrome, so named because it's frequently impossible to sort out how much is  CR/sinusitis with freq throat clearing (which can be related to primary GERD)   vs  causing  secondary (" extra esophageal")  GERD from wide swings in gastric pressure that occur with throat clearing, often  promoting self use of mint and menthol lozenges that reduce the lower esophageal sphincter tone and exacerbate the problem further in a cyclical fashion.   These are the same pts (now being labeled as having "irritable larynx syndrome" by some cough centers) who not infrequently have a history of having failed to tolerate ace inhibitors,  dry powder inhalers like spiriva  or biphosphonates or report having atypical reflux symptoms that don't respond to standard doses of PPI , and are easily confused as having aecopd or asthma flares by even experienced allergists/ pulmonologists.   Will try off spiriva/ max rx for gerd and check allergy profile and regroup in 2 weeks

## 2015-02-01 NOTE — Assessment & Plan Note (Signed)
   Lab Results  Component Value Date   CREATININE 1.79* 01/31/2015   CREATININE 1.3* 04/19/2013   CREATININE 1.49* 03/19/2011   CREATININE 1.63* 03/01/2011   CREATININE 1.19 04/04/2009   CREATININE 1.58* 41/28/2081    Complicated by met acidosis and severe hyperkalemia on aldactone and K > rec er eval ? Admit if can't get K down

## 2015-02-03 LAB — ALLERGY FULL PROFILE
Allergen, D pternoyssinus,d7: <0.1 kU/L
Bahia Grass: <0.1 kU/L
Bermuda Grass: <0.1 kU/L
Box Elder IgE: <0.1 kU/L
Candida Albicans: <0.1 kU/L
Cat Dander: <0.1 kU/L
Curvularia lunata: <0.1 kU/L
Elm IgE: <0.1 kU/L
Fescue: <0.1 kU/L
G005 Rye, Perennial: <0.1 kU/L
G009 Red Top: <0.1 kU/L
Goldenrod: <0.1 kU/L
IGE (IMMUNOGLOBULIN E), SERUM: 6 kU/L (ref <115)
Lamb's Quarters: <0.1 kU/L
Sycamore Tree: <0.1 kU/L

## 2015-02-03 NOTE — Progress Notes (Signed)
Quick Note:  Spoke with pt and notified of results per Dr. Wert. Pt verbalized understanding and denied any questions.  ______ 

## 2015-02-10 ENCOUNTER — Other Ambulatory Visit: Payer: Self-pay

## 2015-02-25 ENCOUNTER — Encounter: Payer: Self-pay | Admitting: Adult Health

## 2015-02-25 ENCOUNTER — Ambulatory Visit (INDEPENDENT_AMBULATORY_CARE_PROVIDER_SITE_OTHER): Payer: Medicare Other | Admitting: Adult Health

## 2015-02-25 VITALS — BP 122/60 | HR 60 | Temp 97.5°F | Ht 63.0 in | Wt 105.0 lb

## 2015-02-25 DIAGNOSIS — N189 Chronic kidney disease, unspecified: Secondary | ICD-10-CM | POA: Diagnosis not present

## 2015-02-25 DIAGNOSIS — R05 Cough: Secondary | ICD-10-CM

## 2015-02-25 DIAGNOSIS — R058 Other specified cough: Secondary | ICD-10-CM

## 2015-02-25 DIAGNOSIS — J449 Chronic obstructive pulmonary disease, unspecified: Secondary | ICD-10-CM | POA: Diagnosis not present

## 2015-02-25 NOTE — Addendum Note (Signed)
Addended by: Osa Craver on: 02/25/2015 02:04 PM   Modules accepted: Orders, Medications

## 2015-02-25 NOTE — Assessment & Plan Note (Addendum)
GOLD II COPD w/ emphysema on CXR  Spiriva on hold due to upper airway irritation  Will have her return for full PFT  Consider adding Spiriva Respimat or Stiolto next ov pending PFT  Does have atrovent neb that she uses rarely-can use this for now until PFT .

## 2015-02-25 NOTE — Patient Instructions (Signed)
Follow med calendar closely and bring to each visit.  follow up Dr. Melvyn Novas  In 3-4 weeks with PFT and As needed

## 2015-02-25 NOTE — Progress Notes (Signed)
Chart and office note reviewed in detail  > agree with a/p as outlined    

## 2015-02-25 NOTE — Progress Notes (Signed)
Subjective:    Patient ID: [name], female    DOB: [birth year],    MRN: [id number]  HPI  45 yowf quit smoking late 90's no resp problems until cabg 2002 thinks noct 02 started then  / breathing never the same since but much worse since 1st of the 2016 assoc with dry hacking referred to pulmonary clinic 6/17/16Dr  Clevenger   01/31/2015 1st [facility] office visit/ Wert   Chief Complaint  Patient presents with  . Pulmonary Consult    Referred by Dr Hamilton Capri. Pt c/o cough and SOB "for a long time"- worse x 6 months to the point of being out of breath just walking from room to room at home. Her cough is prod at times with minimal clear sputum.   abruplty worse sob  x 6 months day > noct  Dry > wet on spiriva no better. Assoc with dry daytime  cough  hb  /hoarsenss not apparently on ACEi but very shaky on details of care. Has neb but rarely uses it ? May help some/ doesn't know what's in it.  >>d/c spiriva , PPI rx   02/25/2015 Follow up /Med Review : SOB/Cough   Pt returns for 1 month follow up .  Seen last ov for pulmonary consult for dyspnea and cough -worse 6 months prior to consult .  CXR showed emphysema  Spirometry showed GOLD II COPD with FEV1 at 68%, ratio 68 She is a former smoker , was on spiriva .  She was started on PPI for GERD r/t cough .  Spiriva was held due to potential upper airway irritation./hoarseness.   Cough /Hoarsness is slightly better.  Cough is mainly dry . No dyspnea at rest . Gets winded with walking.  Says did not desat with walk test last ov.   Hx of kidney cancer s/pcryoablation .  Hx of RLL nodule s/p bx w/ nml lung tissue in 2014  Follow up CT chest 2015 w/ decreased nodule at 1.8cm  Hx of Hemolytic Anemia on chronic steroids w/ prednisone 5mg  daily (>20 years)  On home oxygen 2l/m At bedtime  Since 2002.   Denies chest pain, orthopnea, increased edema or hemoptysis .Marland Kitchen   Last visit labs showed worsening renal failure and hyperkalemia  .  She was referred to ER, says she was admitted overnight at [facility].  Was taken off K+ pills. Records requested.  She is followed by PCP , Dr Manuella Ghazi with recent labs.   We reviewed her meds and organized them into a med calendar with pt education  Appears to be taking correctly.    Current Medications, Allergies, Complete Past Medical History, Past Surgical History, Family History, and Social History were reviewed in [employer] record.  .  Review of Systems  Constitutional:   No  weight loss, night sweats,  Fevers, chills,  +fatigue, or  lassitude.  HEENT:   No headaches,  Difficulty swallowing,  Tooth/dental problems, or  Sore throat,                No sneezing, itching, ear ache, nasal congestion, post nasal drip,   CV:  No chest pain,  Orthopnea, PND, swelling in lower extremities, anasarca, dizziness, palpitations, syncope.   GI  No heartburn, indigestion, abdominal pain, nausea, vomiting, diarrhea, change in bowel habits, loss of appetite, bloody stools.   Resp:   No excess mucus, no productive cough, ,  No coughing up of blood.  No change in color  of mucus.  No wheezing.  No chest wall deformity  Skin: no rash or lesions.  GU: no dysuria, change in color of urine, no urgency or frequency.  No flank pain, no hematuria   MS:  No joint pain or swelling.  No decreased range of motion.  No back pain.  Psych:  No change in mood or affect. No depression or anxiety.  No memory loss.          Objective:   Physical Exam  amb wf   Vital signs reviewed   HEENT:   Edentulous , nl turbinates, and orophanx. Nl external ear canals without cough reflex   NECK :  without JVD/Nodes/TM/ nl carotid upstrokes bilaterally   LUNGS: no acc muscle use, clear to A and P bilaterally without cough on insp or exp maneuvers   CV:  RRR  no s3 or murmur or increase in P2, no edema   ABD:  soft and nontender with nl excursion in the supine position. No bruits or  organomegaly, bowel sounds nl  MS:  warm without deformities, calf tenderness, cyanosis or clubbing  SKIN: warm and dry without lesions    NEURO:  alert, approp, no deficits     I personally reviewed images and agree with radiology impression as follows:  CXR:  01/31/15 Emphysema with pulmonary arterial hypertension. Progressive interstitial disease which is probably bronchitis superimposed on chronic lung disease.   Labs ordered/ reviewed:  Lab 01/31/15 1551  NA 133*  K 6.7*  CL 110  CO2 17*  BUN 40*  CREATININE 1.79*  GLUCOSE 123*   Lab 01/31/15 1551  HGB 11.2*  HCT 34.5*  WBC 8.1  PLT 369.0     Lab Results  Component Value Date   TSH 1.31 01/31/2015     Lab Results  Component Value Date   PROBNP 130.0* 01/31/2015       Assessment & Plan:

## 2015-02-25 NOTE — Assessment & Plan Note (Signed)
Neg allergy profile  Cont w/ trigger prevention with GERD tx for cough  Remain off powder based inhalers  Add mucinex dm As needed

## 2015-02-25 NOTE — Assessment & Plan Note (Signed)
Recent worsening renal failure with hyperkalemia  Records requested from [facility]  follow up with PCP as planned

## 2015-02-26 ENCOUNTER — Telehealth: Payer: Self-pay | Admitting: Adult Health

## 2015-02-26 NOTE — Telephone Encounter (Addendum)
After appointment on 7/12, TP requested records from [facility] Pt had already left from [place], she was unable to sign request form.  Called and spoke with pt and informed her that we had a request form for medical records that needed to be sign. Pt stated that she would come by office between 7/14-7/15 to sign form. Form is laying on top of TP's purple scan folder at JJ's desk Form is completed and just needs her signature and faxed to [phone]

## 2015-03-03 NOTE — Telephone Encounter (Signed)
Pt came by office on 02/28/15 and signed release.  I received it on 03/03/15 and faxed to [phone] Nothing further needed

## 2015-03-07 ENCOUNTER — Other Ambulatory Visit (HOSPITAL_COMMUNITY): Payer: Self-pay | Admitting: Urology

## 2015-03-07 ENCOUNTER — Telehealth: Payer: Self-pay | Admitting: Adult Health

## 2015-03-07 DIAGNOSIS — D49519 Neoplasm of unspecified behavior of unspecified kidney: Secondary | ICD-10-CM

## 2015-03-07 NOTE — Telephone Encounter (Signed)
Received records from [facility] forwarded to Dr. Rexene Edison 03/07/15 fbg.

## 2015-03-26 ENCOUNTER — Ambulatory Visit (INDEPENDENT_AMBULATORY_CARE_PROVIDER_SITE_OTHER)
Admission: RE | Admit: 2015-03-26 | Discharge: 2015-03-26 | Disposition: A | Discharge status: Home / Self Care | Payer: Medicare Other | Source: Ambulatory Visit | Attending: Internal Medicine | Admitting: Internal Medicine

## 2015-03-26 ENCOUNTER — Ambulatory Visit (INDEPENDENT_AMBULATORY_CARE_PROVIDER_SITE_OTHER): Payer: Medicare Other | Admitting: Internal Medicine

## 2015-03-26 ENCOUNTER — Other Ambulatory Visit (INDEPENDENT_AMBULATORY_CARE_PROVIDER_SITE_OTHER): Payer: Medicare Other

## 2015-03-26 ENCOUNTER — Encounter: Payer: Self-pay | Admitting: Internal Medicine

## 2015-03-26 VITALS — BP 92/60 | HR 107 | Ht 62.0 in | Wt 103.0 lb

## 2015-03-26 DIAGNOSIS — J449 Chronic obstructive pulmonary disease, unspecified: Secondary | ICD-10-CM | POA: Diagnosis not present

## 2015-03-26 DIAGNOSIS — R0600 Dyspnea, unspecified: Secondary | ICD-10-CM

## 2015-03-26 DIAGNOSIS — R05 Cough: Secondary | ICD-10-CM | POA: Diagnosis not present

## 2015-03-26 DIAGNOSIS — R058 Other specified cough: Secondary | ICD-10-CM

## 2015-03-26 LAB — PULMONARY FUNCTION TEST
DL/VA % pred: 50 %
DL/VA: 2.27 ml/min/mmHg/L
DLCO unc % pred: 32 %
DLCO unc: 7.03 ml/min/mmHg
FEF 25-75 PRE: 0.64 L/s
FEF 25-75 Post: 0.83 L/sec
FEF2575-%Change-Post: 29 %
FEF2575-%PRED-POST: 60 %
FEF2575-%Pred-Pre: 46 %
FEV1-%CHANGE-POST: 8 %
FEV1-%PRED-POST: 73 %
FEV1-%Pred-Pre: 67 %
FEV1-Post: 1.32 L
FEV1-Pre: 1.22 L
FEV1FVC-%Change-Post: 0 %
FEV1FVC-%PRED-PRE: 85 %
FEV6-%Change-Post: 5 %
FEV6-%PRED-POST: 88 %
FEV6-%PRED-PRE: 83 %
FEV6-PRE: 1.92 L
FEV6-Post: 2.02 L
FEV6FVC-%CHANGE-POST: -2 %
FEV6FVC-%PRED-POST: 104 %
FEV6FVC-%Pred-Pre: 106 %
FVC-%Change-Post: 7 %
FVC-%Pred-Post: 84 %
FVC-%Pred-Pre: 79 %
FVC-PRE: 1.92 L
FVC-Post: 2.06 L
POST FEV6/FVC RATIO: 98 %
PRE FEV6/FVC RATIO: 100 %
Post FEV1/FVC ratio: 64 %
Pre FEV1/FVC ratio: 63 %
RV % PRED: 104 %
RV: 2.37 L
TLC % PRED: 89 %
TLC: 4.26 L

## 2015-03-26 LAB — BASIC METABOLIC PANEL
BUN: 31 mg/dL — AB (ref 6–23)
CALCIUM: 9.3 mg/dL (ref 8.4–10.5)
CO2: 20 mEq/L (ref 19–32)
Chloride: 108 mEq/L (ref 96–112)
Creatinine, Ser: 1.79 mg/dL — ABNORMAL HIGH (ref 0.40–1.20)
GFR: 29.07 mL/min — ABNORMAL LOW (ref >60.00)
GLUCOSE: 98 mg/dL (ref 70–99)
Potassium: 4.5 mEq/L (ref 3.5–5.1)
Sodium: 138 mEq/L (ref 135–145)

## 2015-03-26 LAB — SEDIMENTATION RATE: Sed Rate: 66 mm/hr — ABNORMAL HIGH (ref 0–22)

## 2015-03-26 LAB — CBC WITH DIFFERENTIAL/PLATELET
BASOS ABS: 0.1 10*3/uL (ref 0.0–0.1)
Basophils Relative: 1 % (ref 0.0–3.0)
Eosinophils Absolute: 0.9 10*3/uL — ABNORMAL HIGH (ref 0.0–0.7)
Eosinophils Relative: 12.8 % — ABNORMAL HIGH (ref 0.0–5.0)
HEMATOCRIT: 30.3 % — AB (ref 36.0–46.0)
HEMOGLOBIN: 10 g/dL — AB (ref 12.0–15.0)
LYMPHS ABS: 2 10*3/uL (ref 0.7–4.0)
LYMPHS PCT: 30.4 % (ref 12.0–46.0)
MCHC: 33.1 g/dL (ref 30.0–36.0)
MCV: 90.5 fl (ref 78.0–100.0)
MONOS PCT: 9.8 % (ref 3.0–12.0)
Monocytes Absolute: 0.7 10*3/uL (ref 0.1–1.0)
NEUTROS ABS: 3.1 10*3/uL (ref 1.4–7.7)
Neutrophils Relative %: 46 % (ref 43.0–77.0)
Platelets: 347 10*3/uL (ref 150.0–400.0)
RBC: 3.35 Mil/uL — AB (ref 3.87–5.11)
RDW: 15.6 % — AB (ref 11.5–15.5)
WBC: 6.7 10*3/uL (ref 4.0–10.5)

## 2015-03-26 LAB — BRAIN NATRIURETIC PEPTIDE: Pro B Natriuretic peptide (BNP): 78 pg/mL (ref 0.0–100.0)

## 2015-03-26 NOTE — Progress Notes (Signed)
PFT done today. 

## 2015-03-26 NOTE — Progress Notes (Signed)
Quick Note:  lmtcb ______ 

## 2015-03-26 NOTE — Patient Instructions (Signed)
Please remember to go to the lab and x-ray department downstairs for your tests - we will call you with the results when they are available.  No pulmonary follow up needed

## 2015-03-26 NOTE — Progress Notes (Signed)
Subjective:    Patient ID: [name], female    DOB: Feb 02, 1935,    MRN: [id number]    Brief patient profile:  73 yowf quit smoking late 90's no resp problems until cabg 2002 thinks noct 02 started then  / breathing never the same since but much worse since 1st of the 2016 assoc with dry hacking cough  referred to pulmonary clinic 6/17/16Dr  Clevenger with GOLD II copd documented 03/26/2015     History of Present Illness  01/31/2015 1st [facility] office visit/ [name]   Chief Complaint  Patient presents with  . Pulmonary Consult    Referred by Dr Hamilton Capri. Pt c/o cough and SOB "for a long time"- worse x 6 months to the point of being out of breath just walking from room to room at home. Her cough is prod at times with minimal clear sputum.   abruplty worse sob  x 6 months day > noct  Dry > wet on spiriva no better. Assoc with dry daytime  cough  hb  /hoarsenss not apparently on ACEi but very shaky on details of care. Has neb but rarely uses it ? May help some/ doesn't know what's in it.  >>d/c spiriva , PPI rx   02/25/2015 Follow up /Med Review : SOB/Cough   Pt returns for 1 month follow up .  Seen last ov for pulmonary consult for dyspnea and cough -worse 6 months prior to consult .  CXR showed emphysema  Spirometry showed GOLD II COPD with FEV1 at 68%, ratio 68 She is a former smoker , was on spiriva .  She was started on PPI for GERD r/t cough .  Spiriva was held due to potential upper airway irritation./hoarseness.   Cough /Hoarsness is slightly better.  Cough is mainly dry . No dyspnea at rest . Gets winded with walking.  Says did not desat with walk test last ov.   Hx of kidney cancer s/pcryoablation .  Hx of RLL nodule s/p bx w/ nml lung tissue in 2014  Follow up CT chest 2015 w/ decreased nodule at 1.8cm  Hx of Hemolytic Anemia on chronic steroids w/ prednisone 5mg  daily (>20 years)  On home oxygen 2l/m At bedtime  Since 2002.   Denies chest pain, orthopnea,  increased edema or hemoptysis .Marland Kitchen   Last visit labs showed worsening renal failure and hyperkalemia .  She was referred to ER, says she was admitted overnight at [facility].  Was taken off K+ pills. Records requested.  She is followed by PCP , Dr Manuella Ghazi with recent labs.   We reviewed her meds and organized them into a med calendar with pt education  Appears to be taking correctly.  rec Follow med calendar closely and bring to each visit.     03/26/2015 f/u ov/[name] re: copd II/ ? PF/ confused with meds/ no calendar / out of pred ? Since when  Chief Complaint  Patient presents with  . Follow-up    PFT done today. Pt states her breathing is unchanged. Cough has not improved.    no change sob on or off symbicort but hoarseness better, cough remains dry and day > noct  No obvious day to day or daytime variability or assoc  cp or chest tightness, subjective wheeze or overt sinus or hb symptoms. No unusual exp hx or h/o childhood pna/ asthma or knowledge of premature birth.  Sleeping ok without nocturnal  or early am exacerbation  of respiratory  c/o's  or need for noct saba. Also denies any obvious fluctuation of symptoms with weather or environmental changes or other aggravating or alleviating factors except as outlined above   Current Medications, Allergies, Complete Past Medical History, Past Surgical History, Family History, and Social History were reviewed in [employer] record.  ROS  The following are not active complaints unless bolded sore throat, dysphagia, dental problems, itching, sneezing,  nasal congestion or excess/ purulent secretions, ear ache,   fever, chills, sweats, unintended wt loss, classically pleuritic or exertional cp, hemoptysis,  orthopnea pnd or leg swelling, presyncope, palpitations, abdominal pain, anorexia, nausea, vomiting, diarrhea  or change in bowel or bladder habits, change in stools or urine, dysuria,hematuria,  rash, arthralgias, visual  complaints, headache, numbness, weakness or ataxia or problems with walking or coordination,  change in mood/affect or memory.                  Objective:   Physical Exam  amb wf nad/ min hoarse   Vital signs reviewed   Wt Readings from Last 3 Encounters:  03/26/15 103 lb (46.72 kg)  02/25/15 105 lb (47.628 kg)  01/31/15 104 lb (47.174 kg)       HEENT:   Edentulous , nl turbinates, and orophanx. Nl external ear canals without cough reflex   NECK :  without JVD/Nodes/TM/ nl carotid upstrokes bilaterally   LUNGS: no acc muscle use, clear to A and P bilaterally without cough on insp or exp maneuvers   CV:  RRR  no s3 or murmur or increase in P2, no edema   ABD:  soft and nontender with nl excursion in the supine position. No bruits or organomegaly, bowel sounds nl  MS:  warm without deformities, calf tenderness, cyanosis or clubbing  SKIN: warm and dry without lesions    NEURO:  alert, approp, no deficits     I personally reviewed images and agree with radiology impression as follows:  CXR:  01/31/15 Emphysema with pulmonary arterial hypertension. Progressive interstitial disease which is probably bronchitis superimposed on chronic lung disease.     Labs ordered/ reviewed:    Lab 03/26/15 1530  NA 138  K 4.5  CL 108  CO2 20  BUN 31*  CREATININE 1.79*  GLUCOSE 98       Lab 03/26/15 1530  HGB 10.0*  HCT 30.3*  WBC 6.7  PLT 347.0     Lab Results  Component Value Date   TSH 1.31 01/31/2015     Lab Results  Component Value Date   PROBNP 78.0 03/26/2015     Lab Results  Component Value Date   ESRSEDRATE 66* 03/26/2015           Assessment & Plan:

## 2015-03-27 ENCOUNTER — Encounter: Payer: Self-pay | Admitting: Internal Medicine

## 2015-03-27 ENCOUNTER — Telehealth: Payer: Self-pay | Admitting: Internal Medicine

## 2015-03-27 NOTE — Assessment & Plan Note (Signed)
-   01/31/2015  Walked RA x 3 laps @ 185 ft each stopped due to sob with sats 89/ slow pace but "much farther than I usually go"   - 03/26/2015  [facility] RA  2 laps @ 185 ft each stopped due to  Sob nl pace, no desat   So she's actually improved off maint rx for copd/ most likely sob is related to deconditioning/ anemia and no further pulmonary w/u needed

## 2015-03-27 NOTE — Assessment & Plan Note (Addendum)
-   quit smoking late 1990s - noct 02 since ? 2002 post cabg - spirometry 01/31/15 FEV1  1.20 (63%) ratio 68  - trial off symbicort due to hoarsness 02/01/2015  - PFTs 03/26/15 FEV1  1.32 (73%) ratio 64 and no change p saba and dlco 32 corrects to 50% for alv vol  - 03/26/2015  Walked RA  2 laps @ 185 ft each stopped due to  Sob nl pace, no desat   I had an extended final summary discussion with the patient reviewing all relevant studies completed to date and  lasting 15 to 20 minutes of a 25 minute visit on the following issues:     As I explained to this patient in detail:  although there may be copd present, it may not be clinically relevant:   it does not appear to be limiting activity tolerance any more than a set of worn tires limits someone from driving a car  around a parking lot.  A new set of Michelins might look good but would have no perceived impact on the performance of the car and would not be worth the cost.  That is to say:   this pt is so sedentary I don't recommend aggressive pulmonary rx at this point unless limiting symptoms arise or acute exacerbations become as issue, neither of which is the case now.  I asked the patient to contact this office at any time in the future should either of these problems arise.      Each maintenance medication was reviewed in detail including most importantly the difference between maintenance and as needed and under what circumstances the prns are to be used. This was done in the context of a medication calendar review which provided the patient with a user-friendly unambiguous mechanism for medication administration and reconciliation and provides an action plan for all active problems. It is critical that this be shown to every doctor  for modification during the office visit if necessary so the patient can use it as a working document.

## 2015-03-27 NOTE — Assessment & Plan Note (Signed)
-   allergy profile 01/31/2015 >  Neg RAST/ IgE  Likely to be aggravated by gerd and pulmonary meds like powders/ sprays but no further w/u needed for now

## 2015-03-27 NOTE — Telephone Encounter (Signed)
Result Note     Call pt: Reviewed cxr and no acute change so no change in recommendations made at ov   Notes Recorded by Tanda Rockers, MD on 03/26/2015 at 5:26 PM Call patient : Studies are unchanged - she has moderate kidney dysfunction and anemia that goes along with her kidney problem but these are the same  I spoke with patient about results and she verbalized understanding and had no questions

## 2015-04-11 ENCOUNTER — Other Ambulatory Visit (HOSPITAL_COMMUNITY): Payer: Self-pay | Admitting: Urology

## 2015-04-11 ENCOUNTER — Ambulatory Visit (HOSPITAL_COMMUNITY)
Admission: RE | Admit: 2015-04-11 | Discharge: 2015-04-11 | Disposition: A | Discharge status: Home / Self Care | Payer: Medicare Other | Source: Ambulatory Visit | Attending: Urology | Admitting: Urology

## 2015-04-11 DIAGNOSIS — D49519 Neoplasm of unspecified behavior of unspecified kidney: Secondary | ICD-10-CM

## 2015-04-11 DIAGNOSIS — D1803 Hemangioma of intra-abdominal structures: Secondary | ICD-10-CM | POA: Insufficient documentation

## 2015-04-11 DIAGNOSIS — D495 Neoplasm of unspecified behavior of other genitourinary organs: Secondary | ICD-10-CM | POA: Insufficient documentation

## 2016-02-23 ENCOUNTER — Other Ambulatory Visit (HOSPITAL_COMMUNITY): Payer: Self-pay | Admitting: Urology

## 2016-02-23 DIAGNOSIS — Z85528 Personal history of other malignant neoplasm of kidney: Secondary | ICD-10-CM

## 2016-04-06 ENCOUNTER — Encounter: Payer: Self-pay | Admitting: *Deleted

## 2016-04-07 ENCOUNTER — Encounter: Payer: Self-pay | Admitting: *Deleted

## 2016-04-07 ENCOUNTER — Encounter: Payer: Self-pay | Admitting: Cardiology

## 2016-04-07 ENCOUNTER — Ambulatory Visit (INDEPENDENT_AMBULATORY_CARE_PROVIDER_SITE_OTHER): Payer: Medicare Other | Admitting: Cardiology

## 2016-04-07 VITALS — BP 89/49 | HR 67 | Ht 63.0 in | Wt 106.0 lb

## 2016-04-07 DIAGNOSIS — I272 Other secondary pulmonary hypertension: Secondary | ICD-10-CM

## 2016-04-07 DIAGNOSIS — I493 Ventricular premature depolarization: Secondary | ICD-10-CM | POA: Diagnosis not present

## 2016-04-07 DIAGNOSIS — I2510 Atherosclerotic heart disease of native coronary artery without angina pectoris: Secondary | ICD-10-CM | POA: Diagnosis not present

## 2016-04-07 DIAGNOSIS — I739 Peripheral vascular disease, unspecified: Secondary | ICD-10-CM

## 2016-04-07 NOTE — Progress Notes (Signed)
Clinical Summary Ms. Plano is a 81 y.o.female former patient of Dr Ron Parker, this is our first visit together. She was most recently followed at [facility] cardiology. She is seen for the following medical problems.  1. CAD - previous CABG. Cath 2009 with 2/4 patent grafts - echo 11/2015 [facility] LVEF 55-60%, cannot eval diastolic function, mild to mod MR, mod AI - no recent chest pain. Stable SOB that is unchanged, often worst high temperatures - compliant with meds. Takes ASA 81mg  every other day due to easy bruising.   2. COPD - followed by Dr Melvyn Novas pulmonary - mixed compliance with night time O2  3. PVCs - rare palpitations  4. PAD - no recent leg pains, no claudication  5. Pulm HTN - echo 11/2015 PASP >70 - likely secondary to history of COPD and night time hypoxia.   Past Medical History:  Diagnosis Date  . Carotid artery disease ([place])    Doppler, January, 2010, mild plaque, no major stenoses  . CKD (chronic kidney disease)    Creatinine 1.4, August, 2012  . COPD (chronic obstructive pulmonary disease) ([place])    Home O2, Severe pneumonia 2008  . Coronary artery disease    Catheterization November, 2009, 2 of 4 grafts patent,  . 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 Drug therapy    Prednisone intermittently for anemia  . Ejection fraction    EF normal, cath, 2009 /  EF 55-60%, echo, July, 2012, mild septal bounce  . GERD (gastroesophageal reflux disease)   . Gout   . Hemolytic anemia (HCC)    Darovsky, Probable Coombs negative hemolytic anemia, prednisone therapy intermittent  . History of prolonged Q-T interval on ECG    Intermittent  . Hx of CABG    2002  . Mitral regurgitation    Mild  . Osteoporosis   . PAD (peripheral artery disease) (HCC)    Mild, legs  . PVC's (premature ventricular contractions)   . Renal carcinoma ([place])    Treated by freezing by urology, 2012  . Shoulder pain    Rotator cuff  . Syncope    January, 2010, probable  orthostatic and volume  /  he vent recorder may, 2011, normal sinus rhythm     Allergies  Allergen Reactions  . Codeine     REACTION: hallucinate     Current Outpatient Prescriptions  Medication Sig Dispense Refill  . omeprazole (PRILOSEC) 20 MG capsule Take 20 mg by mouth daily before breakfast.    . predniSONE (DELTASONE) 5 MG tablet Take 5 mg by mouth every morning.     Marland Kitchen acetaminophen (TYLENOL) 325 MG tablet Take as directed per bottle as need for pain    . albuterol (PROAIR HFA) 108 (90 BASE) MCG/ACT inhaler Inhale 2 puffs into the lungs every 6 (six) hours as needed for wheezing or shortness of breath.    . allopurinol (ZYLOPRIM) 100 MG tablet Take 100 mg by mouth every morning.     . CELEBREX 200 MG capsule Take 200 mg by mouth every morning. Daily    . dextromethorphan-guaiFENesin (MUCINEX DM) 30-600 MG per 12 hr tablet Take 1 tablet by mouth 2 (two) times daily.    . famotidine (PEPCID) 20 MG tablet Take 20 mg by mouth at bedtime.    . furosemide (LASIX) 40 MG tablet Take 40 mg by mouth Daily.     Marland Kitchen ipratropium (ATROVENT) 0.02 % nebulizer solution Take 0.5 mg  by nebulization every 4 (four) hours as needed for wheezing or shortness of breath.    Marland Kitchen LORazepam (ATIVAN) 0.5 MG tablet Take 1/2 -1 tablet by mouth twice daily as needed for anxiety    . meclizine (ANTIVERT) 25 MG tablet Take 1 tablet by mouth every 8 hours as need for dizziness    . midodrine (PROAMATINE) 2.5 MG tablet Take 2.5 mg by mouth 2 (two) times daily.     . nitroGLYCERIN (NITROSTAT) 0.4 MG SL tablet Place 1 tablet (0.4 mg total) under the tongue every 5 (five) minutes as needed. (Patient taking differently: Place 0.4 mg under the tongue every 5 (five) minutes as needed. ) 25 tablet 3  . OXYGEN Inhale 2 L into the lungs at bedtime.    . sertraline (ZOLOFT) 50 MG tablet Take 50 mg by mouth every morning.     Marland Kitchen spironolactone (ALDACTONE) 25 MG tablet Take 25 mg by mouth every morning.     . traMADol (ULTRAM) 50 MG  tablet Take 1 tablet by mouth 3 (three) times daily.  0   No current facility-administered medications for this visit.      Past Surgical History:  Procedure Laterality Date  . CARDIAC CATHETERIZATION  2009   2/4 occluded grafts most concerning area was the ostieal stenosis of the LM . Both bypass grafts to the anterioro cirulation are occluded. The mida and distal lLAD gets some filling from the RCA.Stenosis of the LM could potentially sompromise flow to the promixmal and midportion of in the LAD. Intra coronary [place] could be considered should she continue to be symptomatic.  Tx medically for now.  . CORONARY ARTERY BYPASS GRAFT       Allergies  Allergen Reactions  . Codeine     REACTION: hallucinate      Family History  Problem Relation Age of Onset  . Stroke Father   . Heart attack Brother   . Heart attack Mother      Social History Ms. Penado reports that she quit smoking about 18 years ago. Her smoking use included Cigarettes. She started smoking about 64 years ago. She has a 80.00 pack-year smoking history. She has never used smokeless tobacco. Ms. Cockerill reports that she does not drink alcohol.   Review of Systems CONSTITUTIONAL: No weight loss, fever, chills, weakness or fatigue.  HEENT: Eyes: No visual loss, blurred vision, double vision or yellow sclerae.No hearing loss, sneezing, congestion, runny nose or sore throat.  SKIN: No rash or itching.  CARDIOVASCULAR: per HPI RESPIRATORY: No shortness of breath, cough or sputum.  GASTROINTESTINAL: No anorexia, nausea, vomiting or diarrhea. No abdominal pain or blood.  GENITOURINARY: No burning on urination, no polyuria NEUROLOGICAL: No headache, dizziness, syncope, paralysis, ataxia, numbness or tingling in the extremities. No change in bowel or bladder control.  MUSCULOSKELETAL: No muscle, back pain, joint pain or stiffness.  LYMPHATICS: No enlarged nodes. No history of splenectomy.  PSYCHIATRIC: No history of  depression or anxiety.  ENDOCRINOLOGIC: No reports of sweating, cold or heat intolerance. No polyuria or polydipsia.  Marland Kitchen   Physical Examination There were no vitals filed for this visit. Filed Weights   04/07/16 1356  Weight: 106 lb (48.1 kg)    Gen: resting comfortably, no acute distress HEENT: no scleral icterus, pupils equal round and reactive, no palptable cervical adenopathy,  CV Resp: Clear to auscultation bilaterally GI: abdomen is soft, non-tender, non-distended, normal bowel sounds, no hepatosplenomegaly MSK: extremities are warm, no edema.  Skin: warm, no  rash Neuro:  no focal deficits Psych: appropriate affect     Assessment and Plan  1. CAD - no symptoms, continue current meds  2. COPD  - continue to follow with pulmonary  3. PVCs - no recent symptoms, continue to monitor  4. PAD - no symptoms, continue to monitor  5. Pulmonary HTN - continue COPD management, night time O2.       Arnoldo Lenis, M.D.

## 2016-04-07 NOTE — Patient Instructions (Signed)
Your physician wants you to follow-up in: [name] DR. BRANCH You will receive a reminder letter in the mail two months in advance. If you don't receive a letter, please call our office to schedule the follow-up appointment.  Your physician has recommended you make the following change in your medication:   STOP ALDACTONE   Thank you for choosing [facility]!!

## 2016-04-14 ENCOUNTER — Other Ambulatory Visit (HOSPITAL_COMMUNITY): Payer: Self-pay | Admitting: Urology

## 2016-04-14 ENCOUNTER — Ambulatory Visit (HOSPITAL_COMMUNITY)
Admission: RE | Admit: 2016-04-14 | Discharge: 2016-04-14 | Disposition: A | Discharge status: Home / Self Care | Payer: Medicare Other | Source: Ambulatory Visit | Attending: Urology | Admitting: Urology

## 2016-04-14 DIAGNOSIS — D49519 Neoplasm of unspecified behavior of unspecified kidney: Secondary | ICD-10-CM

## 2016-04-14 DIAGNOSIS — D1809 Hemangioma of other sites: Secondary | ICD-10-CM | POA: Diagnosis not present

## 2016-04-14 DIAGNOSIS — X58XXXA Exposure to other specified factors, initial encounter: Secondary | ICD-10-CM | POA: Diagnosis not present

## 2016-04-14 DIAGNOSIS — Z85528 Personal history of other malignant neoplasm of kidney: Secondary | ICD-10-CM | POA: Diagnosis not present

## 2016-04-14 DIAGNOSIS — Z9889 Other specified postprocedural states: Secondary | ICD-10-CM | POA: Insufficient documentation

## 2016-04-14 DIAGNOSIS — J449 Chronic obstructive pulmonary disease, unspecified: Secondary | ICD-10-CM | POA: Insufficient documentation

## 2016-04-14 DIAGNOSIS — I700 Atherosclerosis of aorta: Secondary | ICD-10-CM | POA: Insufficient documentation

## 2016-04-14 DIAGNOSIS — Z951 Presence of aortocoronary bypass graft: Secondary | ICD-10-CM | POA: Insufficient documentation

## 2016-04-14 DIAGNOSIS — S2232XA Fracture of one rib, left side, initial encounter for closed fracture: Secondary | ICD-10-CM | POA: Insufficient documentation

## 2016-04-14 DIAGNOSIS — N281 Cyst of kidney, acquired: Secondary | ICD-10-CM | POA: Insufficient documentation

## 2016-06-13 IMAGING — MR MR ABDOMEN W/O CM
5 of 8 series · 22 of 48 positions shown · non-contrast
Comparison: PET-CT 05/09/2013, chest CT 10/10/2013 and abdominal CT
04/05/2014.

CLINICAL DATA: History of renal cell carcinoma status post
cryoablation in 0575. Right pulmonary nodule biopsy 05/14/2013,
negative for tumor. Subsequent encounter.

EXAM:
MRI ABDOMEN WITHOUT CONTRAST
TECHNIQUE: Multiplanar multisequence MR imaging was performed without the
administration of intravenous contrast. No contrast administered
secondary to renal insufficiency.

[Series 3: DWI b500 · axial · 6.0mm · 1.48mm/px · z∈[-161,+97]mm · 6 of 68 slices shown]
[im 1/68]
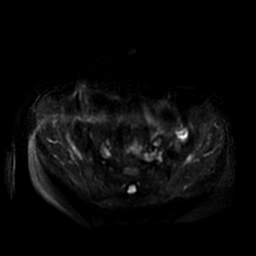
[im 14/68]
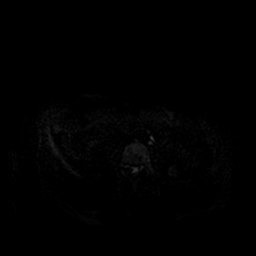
[im 27/68]
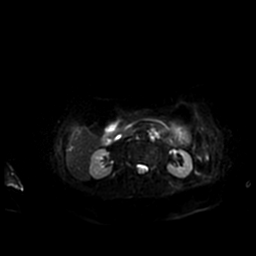
[im 41/68]
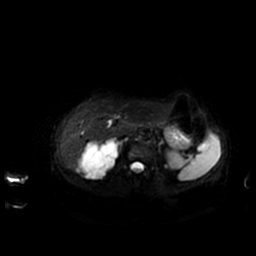
[im 54/68]
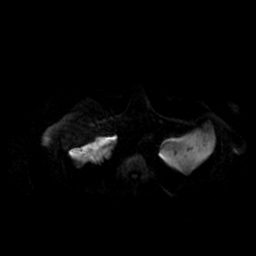
[im 68/68]
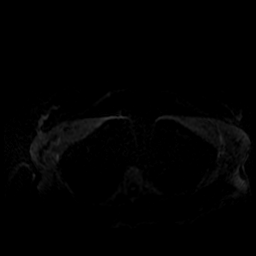

[Series 4: T2 fat-sat · axial · 5.0mm · 0.78mm/px · z∈[-131,+94]mm · 5 of 46 slices shown]
[im 1/46]
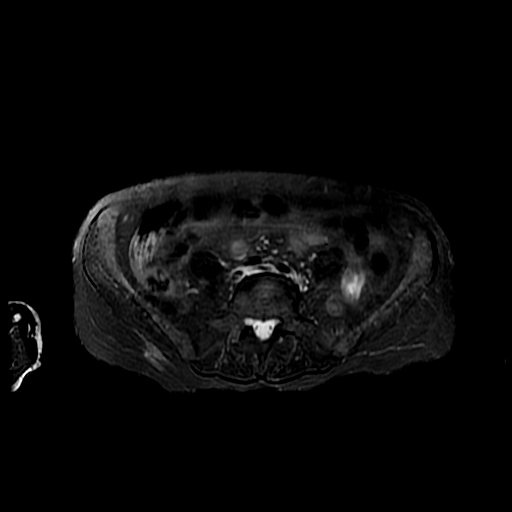
[im 12/46]
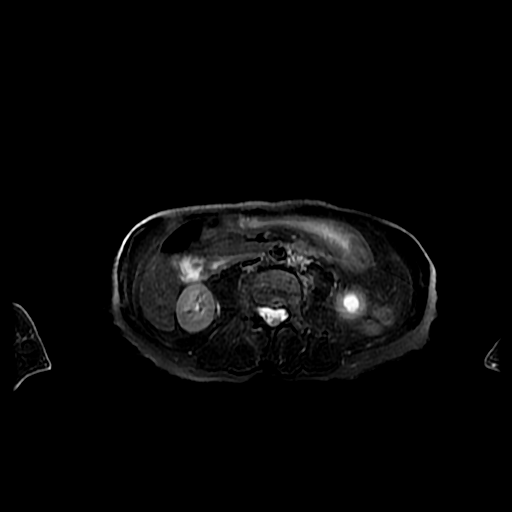
[im 23/46]
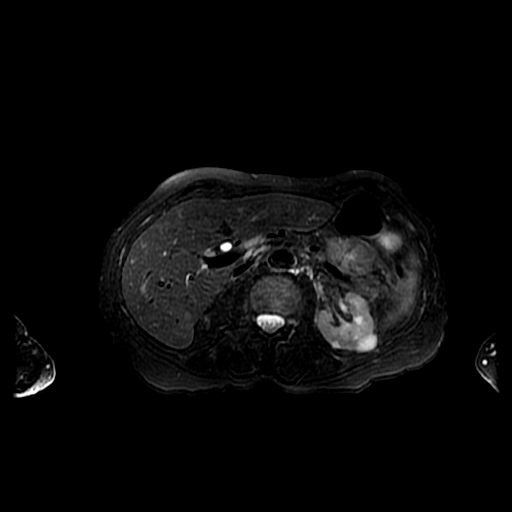
[im 34/46]
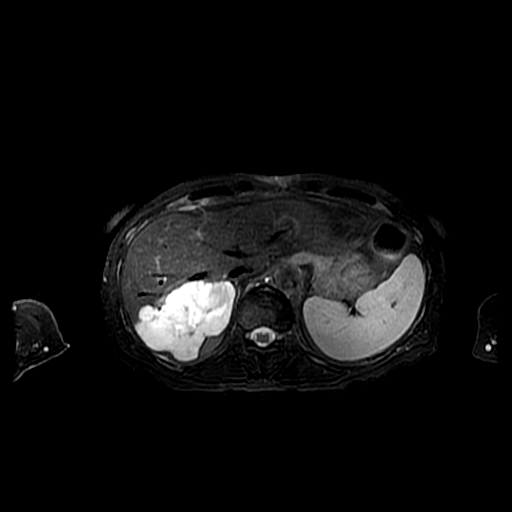
[im 46/46]
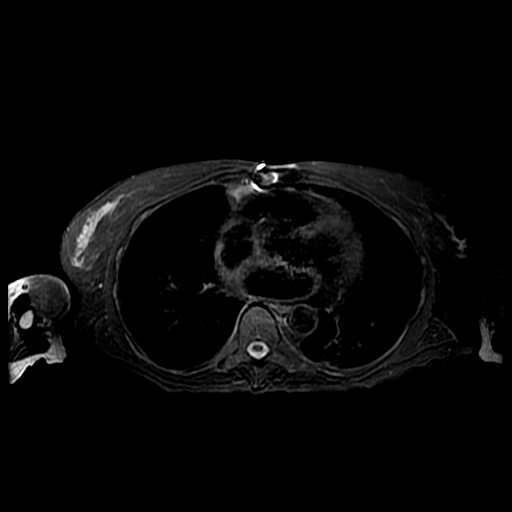

[Series 6: T2 · axial · 5.0mm · 0.78mm/px · z∈[-141,+78]mm · 5 of 45 slices shown (1 of 2)]
[im 1/45]
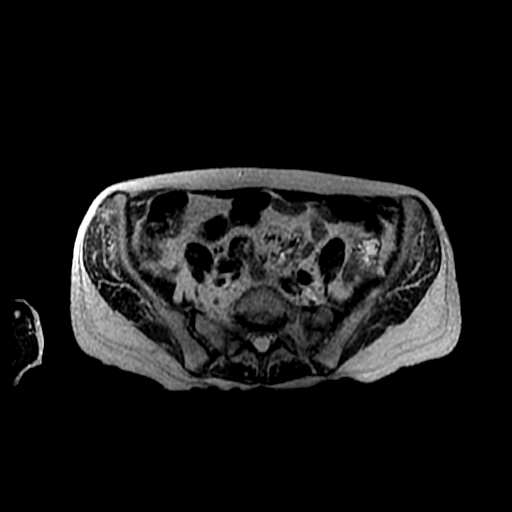
[im 12/45]
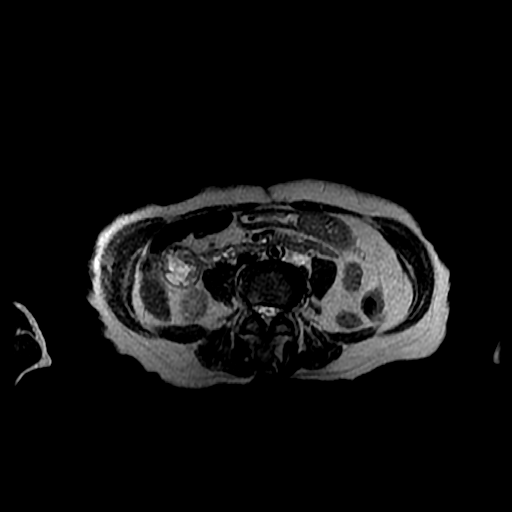
[im 23/45]
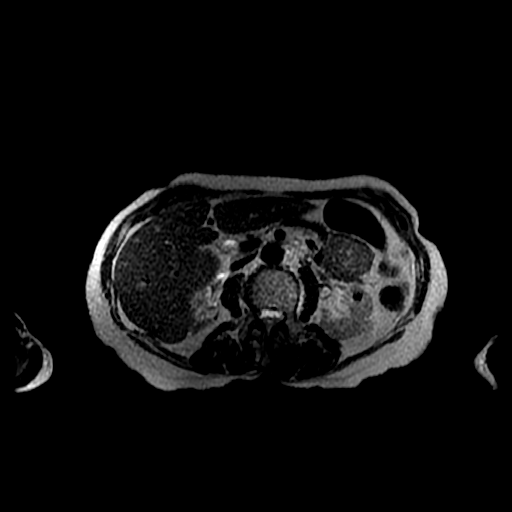
[im 34/45]
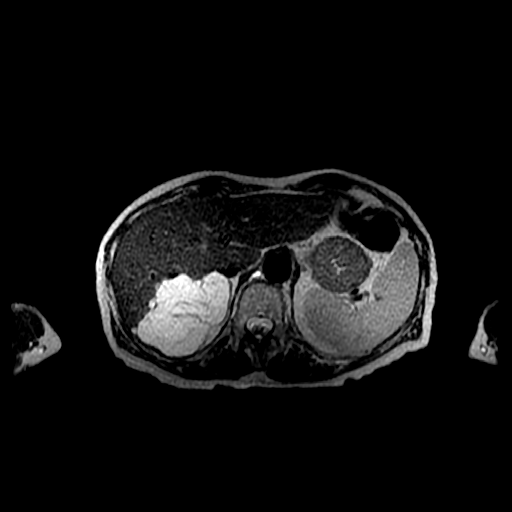
[im 45/45]
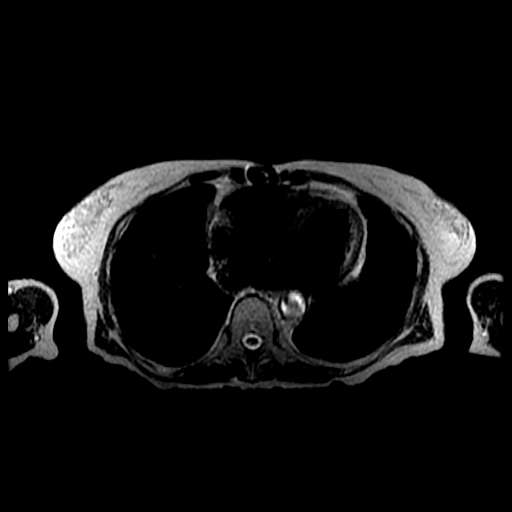

[Series 7: T2 · coronal · 5.0mm · 0.78mm/px · 4 of 34 slices shown (2 of 2)]
[im 1/34]
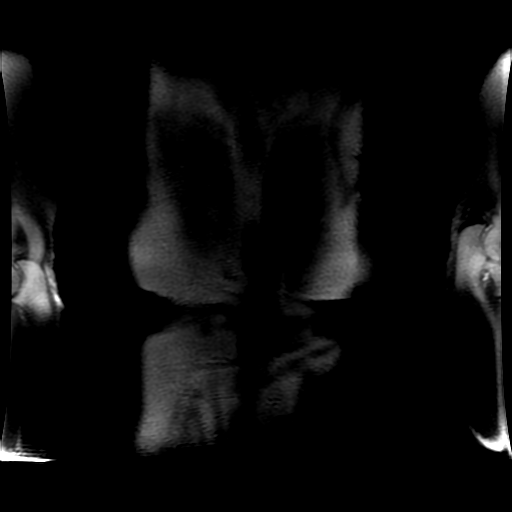
[im 12/34]
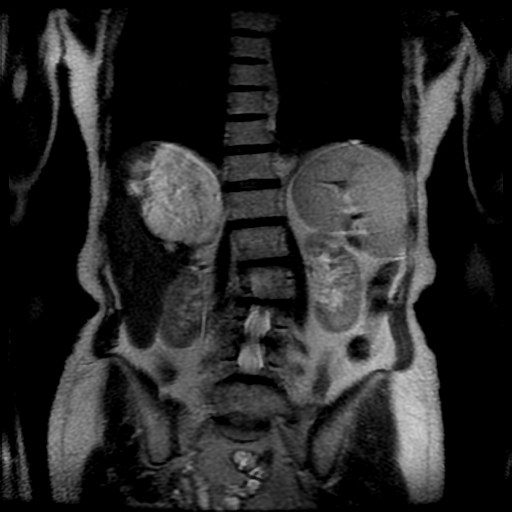
[im 23/34]
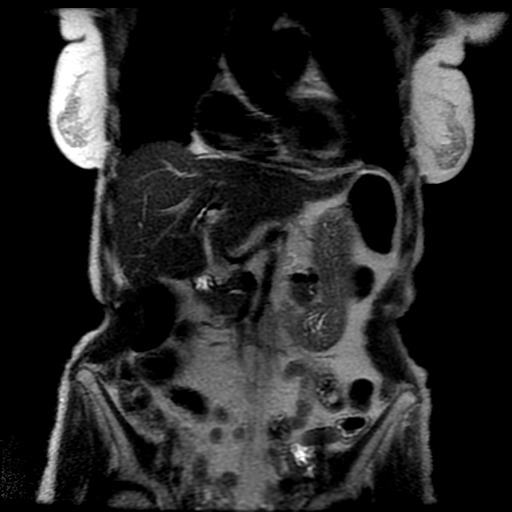
[im 34/34]
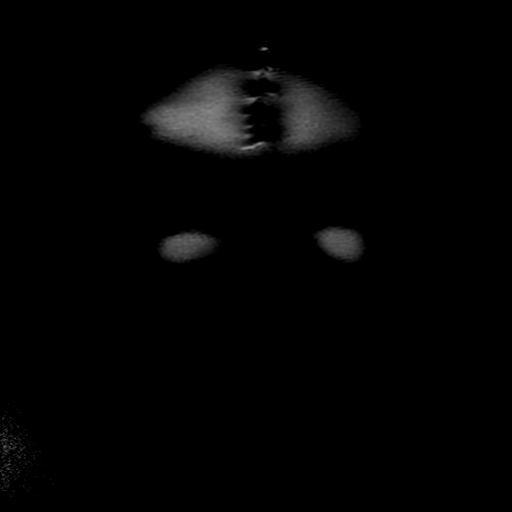

[Series 8: bSSFP · axial · 5.0mm · 0.78mm/px · z∈[-141,-86]mm · 2 of 45 slices shown]
[im 1/45]
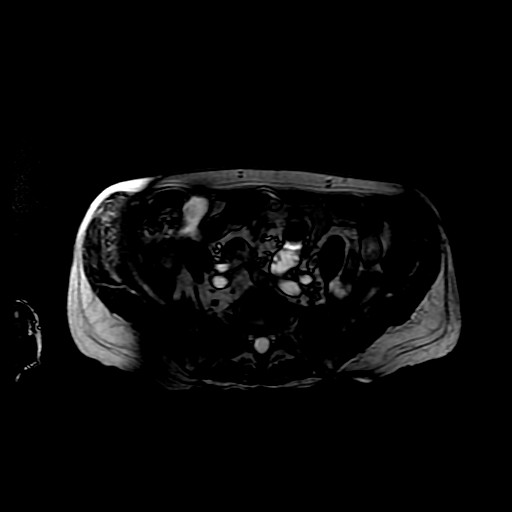
[im 12/45]
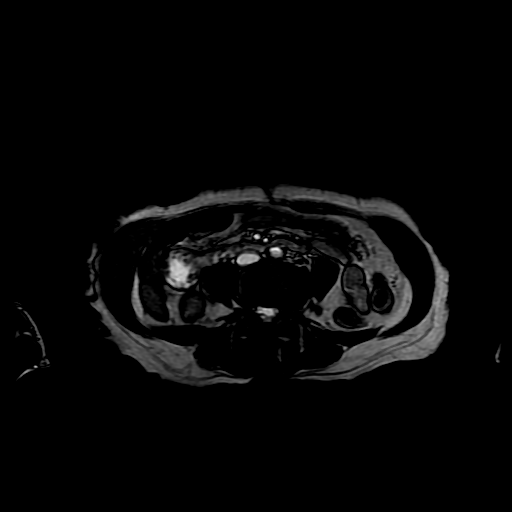

[22 of 48 positions shown; findings below may reference images not displayed]

FINDINGS: Lower chest: The visualized lung bases are clear. No significant
pleural or pericardial effusion.

Hepatobiliary: Again demonstrated is a large lobulated T2
hyperintense mass posteriorly in the right hepatic lobe. This
measures 6.2 x 8.2 x 8.3 cm and is similar to prior examinations.
This has been previously demonstrated to represent a giant
hemangioma. No new or suspicious hepatic findings demonstrated.
Stable mild extrahepatic biliary prominence status post
cholecystectomy.

Pancreas: Unremarkable. No pancreatic ductal dilatation or
surrounding inflammatory changes.

Spleen: Normal in size without focal abnormality.

Adrenals/Urinary Tract: Both adrenal glands appear normal. The right
kidney appears grossly stable with atrophy and cortical scarring in
the upper pole ablation defect. There is no suspicious right renal
mass. The left kidney demonstrates multiple lesions. There are
simple appearing cysts in the upper interpolar region measuring
cm and in the lower pole measuring 1.4 cm. There are least 4 other
left renal lesions which demonstrate low T2 and high T1 signal. The
largest of these is in the interpolar region, measuring 1.4 cm.
These are grossly unchanged from prior noncontrast CT (comparison
limited between modalities) and evaluation is incomplete without
contrast.

Stomach/Bowel: No evidence of bowel wall thickening, distention or
surrounding inflammatory change.

Vascular/Lymphatic: Small retroperitoneal lymph nodes asymmetric to
the left are similar to prior CT. No individually enlarged lymph
nodes identified. No significant vascular findings are present.

Other: None.

Musculoskeletal: No acute or significant osseous findings. Mild
convex left scoliosis with associated spondylosis.
IMPRESSION: 1. No evidence of metastatic renal cell carcinoma.
2. The kidneys are grossly unchanged compared with prior CTs. There
are several complex left renal lesions which are incompletely
characterized without contrast. Follow-up MRI in 1 year suggested.
3. Stable large hepatic hemangioma.

## 2016-06-15 ENCOUNTER — Emergency Department (HOSPITAL_COMMUNITY): Payer: Medicare Other

## 2016-06-15 ENCOUNTER — Encounter (HOSPITAL_COMMUNITY): Payer: Self-pay | Admitting: Emergency Medicine

## 2016-06-15 ENCOUNTER — Emergency Department (HOSPITAL_COMMUNITY)
Admission: EM | Admit: 2016-06-15 | Discharge: 2016-06-15 | Disposition: A | Discharge status: Home / Self Care | Payer: Medicare Other | Attending: Emergency Medicine | Admitting: Emergency Medicine

## 2016-06-15 DIAGNOSIS — R1011 Right upper quadrant pain: Secondary | ICD-10-CM | POA: Diagnosis not present

## 2016-06-15 DIAGNOSIS — R1013 Epigastric pain: Secondary | ICD-10-CM | POA: Insufficient documentation

## 2016-06-15 DIAGNOSIS — Z87891 Personal history of nicotine dependence: Secondary | ICD-10-CM | POA: Diagnosis not present

## 2016-06-15 DIAGNOSIS — I2510 Atherosclerotic heart disease of native coronary artery without angina pectoris: Secondary | ICD-10-CM | POA: Diagnosis not present

## 2016-06-15 DIAGNOSIS — Z79899 Other long term (current) drug therapy: Secondary | ICD-10-CM | POA: Diagnosis not present

## 2016-06-15 DIAGNOSIS — J449 Chronic obstructive pulmonary disease, unspecified: Secondary | ICD-10-CM | POA: Insufficient documentation

## 2016-06-15 DIAGNOSIS — R197 Diarrhea, unspecified: Secondary | ICD-10-CM | POA: Insufficient documentation

## 2016-06-15 DIAGNOSIS — R112 Nausea with vomiting, unspecified: Secondary | ICD-10-CM

## 2016-06-15 DIAGNOSIS — N189 Chronic kidney disease, unspecified: Secondary | ICD-10-CM | POA: Insufficient documentation

## 2016-06-15 LAB — I-STAT CHEM 8, ED
BUN: 19 mg/dL (ref 6–20)
CHLORIDE: 112 mmol/L — AB (ref 101–111)
CREATININE: 1.3 mg/dL — AB (ref 0.44–1.00)
Calcium, Ion: 1.21 mmol/L (ref 1.15–1.40)
GLUCOSE: 85 mg/dL (ref 65–99)
HEMATOCRIT: 27 % — AB (ref 36.0–46.0)
HEMOGLOBIN: 9.2 g/dL — AB (ref 12.0–15.0)
Potassium: 4.1 mmol/L (ref 3.5–5.1)
Sodium: 143 mmol/L (ref 135–145)
TCO2: 22 mmol/L (ref 0–100)

## 2016-06-15 LAB — COMPREHENSIVE METABOLIC PANEL
ALT: 10 U/L — ABNORMAL LOW (ref 14–54)
ANION GAP: 5 (ref 5–15)
AST: 17 U/L (ref 15–41)
Albumin: 3.8 g/dL (ref 3.5–5.0)
Alkaline Phosphatase: 47 U/L (ref 38–126)
BUN: 19 mg/dL (ref 6–20)
CHLORIDE: 113 mmol/L — AB (ref 101–111)
CO2: 23 mmol/L (ref 22–32)
Calcium: 9 mg/dL (ref 8.9–10.3)
Creatinine, Ser: 1.33 mg/dL — ABNORMAL HIGH (ref 0.44–1.00)
GFR calc Af Amer: 43 mL/min — ABNORMAL LOW (ref >60)
GFR calc non Af Amer: 37 mL/min — ABNORMAL LOW (ref >60)
Glucose, Bld: 102 mg/dL — ABNORMAL HIGH (ref 65–99)
Potassium: 3.9 mmol/L (ref 3.5–5.1)
SODIUM: 141 mmol/L (ref 135–145)
TOTAL PROTEIN: 6.3 g/dL — AB (ref 6.5–8.1)
Total Bilirubin: 0.6 mg/dL (ref 0.3–1.2)

## 2016-06-15 LAB — CBC WITH DIFFERENTIAL/PLATELET
BASOS ABS: 0.1 10*3/uL (ref 0.0–0.1)
Basophils Relative: 1 %
EOS ABS: 0.5 10*3/uL (ref 0.0–0.7)
Eosinophils Relative: 7 %
HEMATOCRIT: 27.7 % — AB (ref 36.0–46.0)
Hemoglobin: 8.2 g/dL — ABNORMAL LOW (ref 12.0–15.0)
LYMPHS ABS: 2.3 10*3/uL (ref 0.7–4.0)
Lymphocytes Relative: 34 %
MCH: 22 pg — ABNORMAL LOW (ref 26.0–34.0)
MCHC: 29.6 g/dL — AB (ref 30.0–36.0)
MCV: 74.5 fL — ABNORMAL LOW (ref 78.0–100.0)
Monocytes Absolute: 0.5 10*3/uL (ref 0.1–1.0)
Monocytes Relative: 8 %
NEUTROS ABS: 3.3 10*3/uL (ref 1.7–7.7)
Neutrophils Relative %: 50 %
PLATELETS: 346 10*3/uL (ref 150–400)
RBC: 3.72 MIL/uL — AB (ref 3.87–5.11)
RDW: 16 % — AB (ref 11.5–15.5)
WBC: 6.7 10*3/uL (ref 4.0–10.5)

## 2016-06-15 LAB — C DIFFICILE QUICK SCREEN W PCR REFLEX
C DIFFICILE (CDIFF) INTERP: NOT DETECTED
C Diff antigen: NEGATIVE
C Diff toxin: NEGATIVE

## 2016-06-15 LAB — I-STAT CG4 LACTIC ACID, ED: LACTIC ACID, VENOUS: 1.4 mmol/L (ref 0.5–1.9)

## 2016-06-15 LAB — LIPASE, BLOOD: Lipase: 67 U/L — ABNORMAL HIGH (ref 11–51)

## 2016-06-15 MED ORDER — IOPAMIDOL (ISOVUE-300) INJECTION 61%: 30.0000 mL | Freq: Once | INTRAVENOUS | Status: DC | PRN

## 2016-06-15 MED ORDER — IOPAMIDOL (ISOVUE-300) INJECTION 61%
75.0000 mL | Freq: Once | INTRAVENOUS | Status: AC | PRN
  Administered 2016-06-15: 50 mL via INTRAVENOUS

## 2016-06-15 MED ORDER — ONDANSETRON 4 MG PO TBDP: ORAL_TABLET | ORAL | 0 refills | Status: DC

## 2016-06-15 MED ORDER — LOPERAMIDE HCL 2 MG PO CAPS
2.0000 mg | ORAL_CAPSULE | ORAL | Status: DC | PRN
Start: 2016-06-15 — End: 2016-06-15
  Administered 2016-06-15: 2 mg via ORAL
  Filled 2016-06-15: qty 1

## 2016-06-15 MED ORDER — SODIUM CHLORIDE 0.9 % IV BOLUS (SEPSIS)
1000.0000 mL | Freq: Once | INTRAVENOUS | Status: AC
  Administered 2016-06-15: 1000 mL via INTRAVENOUS

## 2016-06-15 MED ORDER — ONDANSETRON HCL 4 MG/2ML IJ SOLN
4.0000 mg | Freq: Once | INTRAMUSCULAR | Status: AC
  Administered 2016-06-15: 4 mg via INTRAVENOUS
  Filled 2016-06-15: qty 2

## 2016-06-15 NOTE — ED Notes (Signed)
Bed: WA20 Expected date:  Expected time:  Means of arrival:  Comments: EMS- elderly, n/v/d

## 2016-06-15 NOTE — ED Provider Notes (Signed)
[facility] DEPT Provider Note   CSN: [id number] Arrival date & time: 06/15/16  1307     History   Chief Complaint Chief Complaint  Patient presents with  . Emesis    HPI [name] is a 81 y.o. female.  81 yo F with a chief complaint of nausea vomiting and diarrhea. This been going on for the past couple days. She is having some subjective fevers and chills. She states that her vomitus is green colored. She also is having multiple bowel movements. The consistency and smelled bowel movements remind her of when she had Clostridium difficile in the past. She also has recently taken antibiotics for an upper respiratory infection about 2 weeks ago. She denies abdominal pain denies weakness. She is having difficulty eating drinking at home.   The history is provided by the patient.  Abdominal Pain   This is a new problem. The current episode started less than 1 hour ago. The problem occurs constantly. The problem has not changed since onset.The pain is located in the epigastric region. The pain is at a severity of 10/10. The pain is severe. Associated symptoms include diarrhea, nausea and vomiting. Pertinent negatives include fever, dysuria, headaches, arthralgias and myalgias. Nothing aggravates the symptoms. Nothing relieves the symptoms.    Past Medical History:  Diagnosis Date  . Carotid artery disease ([place])    Doppler, January, 2010, mild plaque, no major stenoses  . CKD (chronic kidney disease)    Creatinine 1.4, August, 2012  . COPD (chronic obstructive pulmonary disease) ([place])    Home O2, Severe pneumonia 2008  . Coronary artery disease    Catheterization November, 2009, 2 of 4 grafts patent,  . 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 Drug therapy    Prednisone intermittently for anemia  . Ejection fraction    EF normal, cath, 2009 /  EF 55-60%, echo, July, 2012, mild septal bounce  . GERD (gastroesophageal reflux disease)   . Gout   .  Hemolytic anemia (HCC)    Darovsky, Probable Coombs negative hemolytic anemia, prednisone therapy intermittent  . History of prolonged Q-T interval on ECG    Intermittent  . Hx of CABG    2002  . Mitral regurgitation    Mild  . Osteoporosis   . PAD (peripheral artery disease) (HCC)    Mild, legs  . PVC's (premature ventricular contractions)   . Renal carcinoma ([place])    Treated by freezing by urology, 2012  . Shoulder pain    Rotator cuff  . Syncope    January, 2010, probable orthostatic and volume  /  he vent recorder may, 2011, normal sinus rhythm    Patient Active Problem List   Diagnosis Date Noted  . Dyspnea 01/31/2015  . Upper airway cough syndrome/VCD 01/31/2015  . Renal carcinoma ([place])   . Coronary artery disease   . History of prolonged Q-T interval on ECG   . COPD II   . Hemolytic anemia ([place])   . Drug therapy   . PAD (peripheral artery disease) ([place])   . PVC's (premature ventricular contractions)   . Mitral regurgitation   . Ejection fraction   . Hx of CABG   . Syncope   . Carotid artery disease ([place])   . CKD (chronic kidney disease)   . GERD 01/06/2010    Past Surgical History:  Procedure Laterality Date  . CARDIAC CATHETERIZATION  2009   2/4 occluded grafts most concerning  area was the ostieal stenosis of the LM . Both bypass grafts to the anterioro cirulation are occluded. The mida and distal lLAD gets some filling from the RCA.Stenosis of the LM could potentially sompromise flow to the promixmal and midportion of in the LAD. Intra coronary [place] could be considered should she continue to be symptomatic.  Tx medically for now.  . CORONARY ARTERY BYPASS GRAFT      OB History    No data available       Home Medications    Prior to Admission medications   Medication Sig Start Date End Date Taking? Authorizing Provider  acetaminophen (TYLENOL) 325 MG tablet Take 650 mg by mouth every 6 (six) hours as needed for mild pain or moderate pain.    Yes  Historical Provider, MD  allopurinol (ZYLOPRIM) 100 MG tablet Take 100 mg by mouth every morning.    Yes Historical Provider, MD  donepezil (ARICEPT) 10 MG tablet Take 10 mg by mouth daily.   Yes Historical Provider, MD  LORazepam (ATIVAN) 0.5 MG tablet Take 0.5 mg by mouth 2 (two) times daily as needed for anxiety.   Yes Historical Provider, MD  metoprolol succinate (TOPROL-XL) 25 MG 24 hr tablet Take 25 mg by mouth every morning.  04/05/16  Yes Historical Provider, MD  midodrine (PROAMATINE) 5 MG tablet Take 5 mg by mouth every morning.  04/05/16  Yes Historical Provider, MD  omeprazole (PRILOSEC) 20 MG capsule Take 20 mg by mouth daily before breakfast.   Yes Historical Provider, MD  OXYGEN Inhale 2 L into the lungs at bedtime.   Yes Historical Provider, MD  sertraline (ZOLOFT) 50 MG tablet Take 50 mg by mouth every morning.    Yes Historical Provider, MD  nitroGLYCERIN (NITROSTAT) 0.4 MG SL tablet Place 1 tablet (0.4 mg total) under the tongue every 5 (five) minutes as needed. Patient not taking: Reported on 06/15/2016 02/15/11   Lendon Colonel, NP  ondansetron [facility] ODT) 4 MG disintegrating tablet 4mg  ODT q4 hours prn nausea/vomit 06/15/16   Deno Etienne, DO    Family History Family History  Problem Relation Age of Onset  . Stroke Father   . Heart attack Brother   . Heart attack Mother     Social History Social History  Substance Use Topics  . Smoking status: Former Smoker    Packs/day: 2.00    Years: 40.00    Types: Cigarettes    Start date: 08/27/1951    Quit date: 08/16/1997  . Smokeless tobacco: Never Used  . Alcohol use No     Allergies   Codeine   Review of Systems Review of Systems  Constitutional: Negative for chills and fever.  HENT: Negative for congestion and rhinorrhea.   Eyes: Negative for redness and visual disturbance.  Respiratory: Negative for shortness of breath and wheezing.   Cardiovascular: Negative for chest pain and palpitations.    Gastrointestinal: Positive for abdominal pain, diarrhea, nausea and vomiting.  Genitourinary: Negative for dysuria and urgency.  Musculoskeletal: Negative for arthralgias and myalgias.  Skin: Negative for pallor and wound.  Neurological: Negative for dizziness and headaches.     Physical Exam Updated Vital Signs BP 164/95   Pulse 85   Temp 98.4 F (36.9 C)   Resp 16   SpO2 98%   Physical Exam  Constitutional: She is oriented to person, place, and time. She appears well-developed and well-nourished. No distress.  HENT:  Head: Normocephalic and atraumatic.  Eyes: EOM are normal.  Pupils are equal, round, and reactive to light.  Neck: Normal range of motion. Neck supple.  Cardiovascular: Normal rate and regular rhythm.  Exam reveals no gallop and no friction rub.   No murmur heard. Pulmonary/Chest: Effort normal. She has no wheezes. She has no rales.  Abdominal: Soft. She exhibits no distension and no mass. There is tenderness (worst in the epigastrium and RUQ). There is no rebound and no guarding.  Musculoskeletal: She exhibits no edema or tenderness.  Neurological: She is alert and oriented to person, place, and time.  Skin: Skin is warm and dry. She is not diaphoretic.  Psychiatric: She has a normal mood and affect. Her behavior is normal.  Nursing note and vitals reviewed.    ED Treatments / Results  Labs (all labs ordered are listed, but only abnormal results are displayed) Labs Reviewed  CBC WITH DIFFERENTIAL/PLATELET - Abnormal; Notable for the following:       Result Value   RBC 3.72 (*)    Hemoglobin 8.2 (*)    HCT 27.7 (*)    MCV 74.5 (*)    MCH 22.0 (*)    MCHC 29.6 (*)    RDW 16.0 (*)    All other components within normal limits  COMPREHENSIVE METABOLIC PANEL - Abnormal; Notable for the following:    Chloride 113 (*)    Glucose, Bld 102 (*)    Creatinine, Ser 1.33 (*)    Total Protein 6.3 (*)    ALT 10 (*)    GFR calc non Af Amer 37 (*)    GFR calc Af  Amer 43 (*)    All other components within normal limits  LIPASE, BLOOD - Abnormal; Notable for the following:    Lipase 67 (*)    All other components within normal limits  I-STAT CHEM 8, ED - Abnormal; Notable for the following:    Chloride 112 (*)    Creatinine, Ser 1.30 (*)    Hemoglobin 9.2 (*)    HCT 27.0 (*)    All other components within normal limits  C DIFFICILE QUICK SCREEN W PCR REFLEX  I-STAT CG4 LACTIC ACID, ED    EKG  EKG Interpretation None       Radiology Ct Abdomen Pelvis W Contrast  Result Date: 06/15/2016 CLINICAL DATA:  Nausea, vomiting, and diarrhea since last night, history of Celsius difficile, patient reports similar symptoms, drainage watery stool, bilious vomiting, abdominal pain, history COPD, coronary artery disease post CABG EXAM: CT ABDOMEN AND PELVIS WITH CONTRAST TECHNIQUE: Multidetector CT imaging of the abdomen and pelvis was performed using the standard protocol following bolus administration of intravenous contrast. Sagittal and coronal MPR images reconstructed from axial data set. CONTRAST:  67mL ISOVUE-300 IOPAMIDOL (ISOVUE-300) INJECTION 61% IV. Dilute oral contrast. COMPARISON:  04/05/2014 CT abdomen and pelvis, MR abdomen 04/14/2016 FINDINGS: Lower chest: Subsegmental atelectasis at both lung bases with underlying emphysematous changes. Hepatobiliary: Large mass at posterior aspect RIGHT lobe liver superiorly, 8.5 x 5.8 x 6.3 cm in size, demonstrating nodular areas of early peripheral enhancement with significant fill-in on delayed images most consistent with large cavernous hemangioma, unchanged. Gallbladder surgically absent. Pancreas: Normal appearance Spleen: Normal appearance Adrenals/Urinary Tract: Unremarkable adrenal glands. Deformity of RIGHT kidney with scarring and calcification at upper pole, by history prior cryoablation. Multiple LEFT renal cysts identified within additional intermediate attenuation 16 x 11 mm nodule at lateral mid  LEFT kidney image 25. Ureters and bladder unremarkable. Stomach/Bowel: Stomach opacified and predominantly decompressed. Stomach and small  bowel loops unremarkable. Colon incompletely distended without definite wall thickening or surrounding inflammatory process. Normal appendix. Vascular/Lymphatic: Atherosclerotic calcifications aorta and iliac arteries. No adenopathy. Reproductive: Surgically absent uterus.  Normal sized ovaries. Other: No free air or free fluid. No definite inflammatory changes or hernia. Musculoskeletal: Osseous demineralization. Degenerative disc disease changes at L2-L3. IMPRESSION: Large RIGHT lobe liver mass 8.5 x 5.8 x 6.3 cm in size most consistent with cavernous hemangioma, unchanged since prior MR and CT exams. Post cryoablation scarring at upper pole RIGHT kidney. Multiple LEFT renal cysts with an additional 16 x 11 mm indeterminate intermediate attenuation lesion at the mid LEFT kidney, felt represent complex/hemorrhagic cyst by prior MR. No acute intra-abdominal or intrapelvic abnormalities. Aortic atherosclerosis. Electronically Signed   By: Lavonia Dana M.D.   On: 06/15/2016 17:28    Procedures Procedures (including critical care time)  Medications Ordered in ED Medications  iopamidol (ISOVUE-300) 61 % injection 30 mL (not administered)  loperamide (IMODIUM) capsule 2 mg (2 mg Oral Given 06/15/16 1802)  sodium chloride 0.9 % bolus 1,000 mL (0 mLs Intravenous Stopped 06/15/16 1622)  ondansetron (ZOFRAN) injection 4 mg (4 mg Intravenous Given 06/15/16 1425)  iopamidol (ISOVUE-300) 61 % injection 75 mL (50 mLs Intravenous Contrast Given 06/15/16 1705)     Initial Impression / Assessment and Plan / ED Course  I have reviewed the triage vital signs and the nursing notes.  Pertinent labs & imaging results that were available during my care of the patient were reviewed by me and considered in my medical decision making (see chart for details).  Clinical Course    81  yo F With a chief complaint of nausea vomiting and diarrhea. Patient is describing bilious emesis. I'll obtain a CT scan of the abdomen and pelvis contrast.  CT scan without acute pathology. Patient is able to tolerate by mouth in the ED. She has a mild drop in her hemoglobin. We'll have her follow-up in 2 days' time for recheck. Discharge home.  8:43 PM:  I have discussed the diagnosis/risks/treatment options with the patient and believe the pt to be eligible for discharge home to follow-up with PCP. We also discussed returning to the ED immediately if new or worsening sx occur. We discussed the sx which are most concerning (e.g., sudden worsening pain, fever, inability to tolerate by mouth) that necessitate immediate return. Medications administered to the patient during their visit and any new prescriptions provided to the patient are listed below.  Medications given during this visit Medications  iopamidol (ISOVUE-300) 61 % injection 30 mL (not administered)  loperamide (IMODIUM) capsule 2 mg (2 mg Oral Given 06/15/16 1802)  sodium chloride 0.9 % bolus 1,000 mL (0 mLs Intravenous Stopped 06/15/16 1622)  ondansetron (ZOFRAN) injection 4 mg (4 mg Intravenous Given 06/15/16 1425)  iopamidol (ISOVUE-300) 61 % injection 75 mL (50 mLs Intravenous Contrast Given 06/15/16 1705)     The patient appears reasonably screen and/or stabilized for discharge and I doubt any other medical condition or other [facility] requiring further screening, evaluation, or treatment in the ED at this time prior to discharge.    Final Clinical Impressions(s) / ED Diagnoses   Final diagnoses:  Nausea vomiting and diarrhea    New Prescriptions Discharge Medication List as of 06/15/2016  5:53 PM    START taking these medications   Details  ondansetron (ZOFRAN ODT) 4 MG disintegrating tablet 4mg  ODT q4 hours prn nausea/vomit, Print         Deno Etienne, DO  06/15/16 2043  

## 2016-06-15 NOTE — ED Notes (Signed)
Patient reports she had another episode of vomiting, but is currently not nauseated.  Emesis was the greenish color as before.  MD notified.

## 2016-06-15 NOTE — Discharge Instructions (Signed)
Take imodium as needed for diarrhea. Drink plenty of fluids.  Try the BRAT diet(bananas rice applesauce and toast)  Return if you feel worse, or cant eat or drink anything

## 2016-06-15 NOTE — ED Triage Notes (Signed)
Patient from home with nausea, vomiting, and diarrhea which started last night.  Patient lives with her husband.  History of c-diff and patient reports her current symptoms are similar.  Greenish watery stool, and vomiting bile colored liquid.

## 2017-03-08 ENCOUNTER — Encounter (HOSPITAL_COMMUNITY): Payer: Self-pay | Admitting: *Deleted

## 2017-03-08 ENCOUNTER — Emergency Department (HOSPITAL_COMMUNITY)
Admission: EM | Admit: 2017-03-08 | Discharge: 2017-03-08 | Disposition: A | Discharge status: Home / Self Care | Payer: Medicare Other | Attending: Physician Assistant | Admitting: Physician Assistant

## 2017-03-08 DIAGNOSIS — J449 Chronic obstructive pulmonary disease, unspecified: Secondary | ICD-10-CM | POA: Diagnosis not present

## 2017-03-08 DIAGNOSIS — F17210 Nicotine dependence, cigarettes, uncomplicated: Secondary | ICD-10-CM | POA: Insufficient documentation

## 2017-03-08 DIAGNOSIS — Z79899 Other long term (current) drug therapy: Secondary | ICD-10-CM | POA: Diagnosis not present

## 2017-03-08 DIAGNOSIS — K5791 Diverticulosis of intestine, part unspecified, without perforation or abscess with bleeding: Secondary | ICD-10-CM

## 2017-03-08 DIAGNOSIS — K922 Gastrointestinal hemorrhage, unspecified: Secondary | ICD-10-CM | POA: Insufficient documentation

## 2017-03-08 DIAGNOSIS — N189 Chronic kidney disease, unspecified: Secondary | ICD-10-CM | POA: Insufficient documentation

## 2017-03-08 DIAGNOSIS — K625 Hemorrhage of anus and rectum: Secondary | ICD-10-CM | POA: Diagnosis present

## 2017-03-08 DIAGNOSIS — I2510 Atherosclerotic heart disease of native coronary artery without angina pectoris: Secondary | ICD-10-CM | POA: Diagnosis not present

## 2017-03-08 LAB — COMPREHENSIVE METABOLIC PANEL
ALK PHOS: 49 U/L (ref 38–126)
ALT: 12 U/L — ABNORMAL LOW (ref 14–54)
ANION GAP: 10 (ref 5–15)
AST: 18 U/L (ref 15–41)
Albumin: 3.9 g/dL (ref 3.5–5.0)
BUN: 27 mg/dL — ABNORMAL HIGH (ref 6–20)
CALCIUM: 9.5 mg/dL (ref 8.9–10.3)
CO2: 24 mmol/L (ref 22–32)
Chloride: 104 mmol/L (ref 101–111)
Creatinine, Ser: 1.35 mg/dL — ABNORMAL HIGH (ref 0.44–1.00)
GFR, EST AFRICAN AMERICAN: 42 mL/min — AB (ref >60)
GFR, EST NON AFRICAN AMERICAN: 36 mL/min — AB (ref >60)
GLUCOSE: 120 mg/dL — AB (ref 65–99)
Potassium: 5.1 mmol/L (ref 3.5–5.1)
Sodium: 138 mmol/L (ref 135–145)
TOTAL PROTEIN: 5.8 g/dL — AB (ref 6.5–8.1)
Total Bilirubin: 0.9 mg/dL (ref 0.3–1.2)

## 2017-03-08 LAB — CBC
HCT: 36.9 % (ref 36.0–46.0)
HEMOGLOBIN: 10.9 g/dL — AB (ref 12.0–15.0)
MCH: 25.5 pg — ABNORMAL LOW (ref 26.0–34.0)
MCHC: 29.5 g/dL — AB (ref 30.0–36.0)
MCV: 86.2 fL (ref 78.0–100.0)
Platelets: 323 10*3/uL (ref 150–400)
RBC: 4.28 MIL/uL (ref 3.87–5.11)
RDW: 24 % — ABNORMAL HIGH (ref 11.5–15.5)
WBC: 11 10*3/uL — AB (ref 4.0–10.5)

## 2017-03-08 NOTE — ED Triage Notes (Signed)
Pt reports large amt of rectal bleeding today, pt hx of anemia recently requiring several blood transfusion, pt appear weak & unsteady on her feet, pt c/o L abd pain, reports n/v with x 1 vomiting episode yesterday, pt reports diarrhea, A&O x4, pt c/o SOB

## 2017-03-08 NOTE — ED Notes (Signed)
Pt verbalized understanding discharge instructions and denies any further needs or questions at this time. VS stable, ambulatory and steady gait.   

## 2017-03-08 NOTE — Discharge Instructions (Signed)
Please return with any bleeding, dizziness, or symptoms that are concerning  such as abdominal pain.

## 2017-03-08 NOTE — ED Provider Notes (Signed)
[facility] DEPT Provider Note   CSN: [id number] Arrival date & time: 03/08/17  1344     History   Chief Complaint Chief Complaint  Patient presents with  . GI Bleeding    HPI [name] is a 82 y.o. female.  HPI   Pt is an 82 yo female presenting with one episode of large blood from rectum .  This happened at 8:30 am. No episodes since. Of note patient had recent hospitalization 1 month ago in [place] where she had lower GI bleeding. She was given an option of an upper GI scope, however her family felt like she could not handle the prep. The decision was made just to give blood. At that time patient's hemoglobin and had risen to 8.5.  Patient had been feeling fine until the one bloody episode today.  Past Medical History:  Diagnosis Date  . Carotid artery disease ([place])    Doppler, January, 2010, mild plaque, no major stenoses  . CKD (chronic kidney disease)    Creatinine 1.4, August, 2012  . COPD (chronic obstructive pulmonary disease) ([place])    Home O2, Severe pneumonia 2008  . Coronary artery disease    Catheterization November, 2009, 2 of 4 grafts patent,  . 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 Drug therapy    Prednisone intermittently for anemia  . Ejection fraction    EF normal, cath, 2009 /  EF 55-60%, echo, July, 2012, mild septal bounce  . GERD (gastroesophageal reflux disease)   . Gout   . Hemolytic anemia (HCC)    Darovsky, Probable Coombs negative hemolytic anemia, prednisone therapy intermittent  . History of prolonged Q-T interval on ECG    Intermittent  . Hx of CABG    2002  . Mitral regurgitation    Mild  . Osteoporosis   . PAD (peripheral artery disease) (HCC)    Mild, legs  . PVC's (premature ventricular contractions)   . Renal carcinoma ([place])    Treated by freezing by urology, 2012  . Shoulder pain    Rotator cuff  . Syncope    January, 2010, probable orthostatic and volume  /  he vent recorder may, 2011, normal  sinus rhythm    Patient Active Problem List   Diagnosis Date Noted  . Dyspnea 01/31/2015  . Upper airway cough syndrome/VCD 01/31/2015  . Renal carcinoma ([place])   . Coronary artery disease   . History of prolonged Q-T interval on ECG   . COPD II   . Hemolytic anemia ([place])   . Drug therapy   . PAD (peripheral artery disease) ([place])   . PVC's (premature ventricular contractions)   . Mitral regurgitation   . Ejection fraction   . Hx of CABG   . Syncope   . Carotid artery disease ([place])   . CKD (chronic kidney disease)   . GERD 01/06/2010    Past Surgical History:  Procedure Laterality Date  . CARDIAC CATHETERIZATION  2009   2/4 occluded grafts most concerning area was the ostieal stenosis of the LM . Both bypass grafts to the anterioro cirulation are occluded. The mida and distal lLAD gets some filling from the RCA.Stenosis of the LM could potentially sompromise flow to the promixmal and midportion of in the LAD. Intra coronary [place] could be considered should she continue to be symptomatic.  Tx medically for now.  . CORONARY ARTERY BYPASS GRAFT      OB History  No data available       Home Medications    Prior to Admission medications   Medication Sig Start Date End Date Taking? Authorizing Provider  acetaminophen (TYLENOL) 325 MG tablet Take 650 mg by mouth every 6 (six) hours as needed for mild pain or moderate pain.     [provider]  allopurinol (ZYLOPRIM) 100 MG tablet Take 100 mg by mouth every morning.     [provider]  donepezil (ARICEPT) 10 MG tablet Take 10 mg by mouth daily.    [provider]  LORazepam (ATIVAN) 0.5 MG tablet Take 0.5 mg by mouth 2 (two) times daily as needed for anxiety.    [provider]  metoprolol succinate (TOPROL-XL) 25 MG 24 hr tablet Take 25 mg by mouth every morning.  04/05/16   [provider]  midodrine (PROAMATINE) 5 MG tablet Take 5 mg by mouth every morning.  04/05/16   [provider]  nitroGLYCERIN (NITROSTAT) 0.4 MG SL tablet Place 1 tablet (0.4 mg total) under the tongue every 5 (five) minutes as needed. Patient not taking: Reported on 06/15/2016 02/15/11   Lendon Colonel, NP  omeprazole (PRILOSEC) 20 MG capsule Take 20 mg by mouth daily before breakfast.    [provider]  ondansetron (ZOFRAN ODT) 4 MG disintegrating tablet 4mg  ODT q4 hours prn nausea/vomit 06/15/16   Deno Etienne, DO  OXYGEN Inhale 2 L into the lungs at bedtime.    [provider]  sertraline (ZOLOFT) 50 MG tablet Take 50 mg by mouth every morning.     [provider]    Family History Family History  Problem Relation Age of Onset  . Stroke Father   . Heart attack Brother   . Heart attack Mother     Social History Social History  Substance Use Topics  . Smoking status: Former Smoker    Packs/day: 2.00    Years: 40.00    Types: Cigarettes    Start date: 08/27/1949    Quit date: 08/16/1997  . Smokeless tobacco: Never Used  . Alcohol use No     Allergies   Codeine   Review of Systems Review of Systems  Constitutional: Negative for activity change.  Respiratory: Negative for shortness of breath.   Cardiovascular: Negative for chest pain.  Gastrointestinal: Positive for blood in stool. Negative for abdominal pain.  All other systems reviewed and are negative.    Physical Exam Updated Vital Signs BP (!) 198/57   Pulse 70   Temp 97.8 F (36.6 C) (Oral)   Resp 16   Ht 5\' 2"  (1.575 m)   Wt 44.5 kg (98 lb)   SpO2 99%   BMI 17.92 kg/m   Physical Exam  Constitutional: She is oriented to person, place, and time. She appears well-developed and well-nourished.  HENT:  Head: Normocephalic and atraumatic.  Eyes: Right eye exhibits no discharge.  Cardiovascular: Normal rate, regular rhythm and normal heart sounds.   No murmur heard. Pulmonary/Chest: Effort normal and breath sounds normal. She has no wheezes. She has no rales.  Abdominal:  Soft. She exhibits no distension. There is no tenderness.  Neurological: She is oriented to person, place, and time.  Skin: Skin is warm and dry. She is not diaphoretic.  Psychiatric: She has a normal mood and affect.  Nursing note and vitals reviewed.    ED Treatments / Results  Labs (all labs ordered are listed, but only abnormal results are displayed) Labs Reviewed  COMPREHENSIVE METABOLIC PANEL - Abnormal; Notable for the following:       Result Value   Glucose, Bld 120 (*)    BUN 27 (*)    Creatinine, Ser 1.35 (*)    Total Protein 5.8 (*)    ALT 12 (*)    GFR calc non Af Amer 36 (*)    GFR calc Af Amer 42 (*)    All other components within normal limits  CBC - Abnormal; Notable for the following:    WBC 11.0 (*)    Hemoglobin 10.9 (*)    MCH 25.5 (*)    MCHC 29.5 (*)    RDW 24.0 (*)    All other components within normal limits  POC OCCULT BLOOD, ED  TYPE AND SCREEN    EKG  EKG Interpretation  Date/Time:  Tuesday March 08 2017 14:12:00 EDT Ventricular Rate:  69 PR Interval:  126 QRS Duration: 72 QT Interval:  406 QTC Calculation: 435 R Axis:   76 Text Interpretation:  Sinus rhythm with occasional Premature ventricular complexes Cannot rule out Anterior infarct , age undetermined Abnormal ECG No significant change since last tracing Confirmed by Zenovia Jarred [id number]) on 03/08/2017 4:15:34 PM       Radiology No results found.  Procedures Procedures (including critical care time)  Medications Ordered in ED Medications - No data to display   Initial Impression / Assessment and Plan / ED Course  I have reviewed the triage vital signs and the nursing notes.  Pertinent labs & imaging results that were available during my care of the patient were reviewed by me and considered in my medical decision making (see chart for details).     Well appearing 82 year old feel presenting with one episode of blood per rectum. Patient feels completely improved. She  had normal vital signs. Hemoglobin is higher than it was last month. It is 10.5 today.Stable vitals, he will been improved, and no symptoms at this time. Patient's not had any bleeding the last 10 Hours. Since only happen one time, gave patient and family option whether to admit for observation or discharge home.  6:04 PM Patient cnetered decision-making made with patient, patient's son, the patient's daughter on the phone, Mariann Laster. Offered admission to hospital where we would observe her and watch for more bleeding. However since she does not want any GI intervention, we would just be observing her and repeating her hemoglobin as needed. Patient and family are aware of this and I gave them ample time to make a decision amd discussed amongst themselves that whether they would like admission versus discharge. They've decided upon discharge. We will give them return precautions to return if they have any increase in bleeding, dizziness, symptoms, or other concerns.  Final Clinical Impressions(s) / ED Diagnoses   Final diagnoses:  None    New Prescriptions New Prescriptions   No medications on file     Macarthur Critchley, MD 03/08/17 1806

## 2017-03-09 LAB — TYPE AND SCREEN
ABO/RH(D): O POS
Antibody Screen: POSITIVE
DAT, IGG: POSITIVE
UNIT DIVISION: 0
Unit division: 0

## 2017-03-09 LAB — BPAM RBC
BLOOD PRODUCT EXPIRATION DATE: 201808222359
Blood Product Expiration Date: 201808222359
ISSUE DATE / TIME: 201807232356
ISSUE DATE / TIME: 201807232356
UNIT TYPE AND RH: 5100
Unit Type and Rh: 5100

## 2017-06-15 DIAGNOSIS — N189 Chronic kidney disease, unspecified: Secondary | ICD-10-CM | POA: Insufficient documentation

## 2017-06-15 DIAGNOSIS — M25512 Pain in left shoulder: Secondary | ICD-10-CM | POA: Insufficient documentation

## 2017-06-15 DIAGNOSIS — I779 Disorder of arteries and arterioles, unspecified: Secondary | ICD-10-CM | POA: Insufficient documentation

## 2017-06-15 DIAGNOSIS — J449 Chronic obstructive pulmonary disease, unspecified: Secondary | ICD-10-CM | POA: Insufficient documentation

## 2017-06-15 DIAGNOSIS — M19012 Primary osteoarthritis, left shoulder: Secondary | ICD-10-CM | POA: Insufficient documentation

## 2017-06-15 DIAGNOSIS — M12811 Other specific arthropathies, not elsewhere classified, right shoulder: Secondary | ICD-10-CM | POA: Insufficient documentation

## 2017-06-15 DIAGNOSIS — I739 Peripheral vascular disease, unspecified: Secondary | ICD-10-CM | POA: Insufficient documentation

## 2018-01-17 DIAGNOSIS — I10 Essential (primary) hypertension: Secondary | ICD-10-CM | POA: Diagnosis not present

## 2018-01-17 DIAGNOSIS — J449 Chronic obstructive pulmonary disease, unspecified: Secondary | ICD-10-CM | POA: Diagnosis not present

## 2018-01-17 DIAGNOSIS — I2510 Atherosclerotic heart disease of native coronary artery without angina pectoris: Secondary | ICD-10-CM | POA: Diagnosis not present

## 2018-01-17 DIAGNOSIS — I509 Heart failure, unspecified: Secondary | ICD-10-CM | POA: Diagnosis not present

## 2018-01-21 DIAGNOSIS — J449 Chronic obstructive pulmonary disease, unspecified: Secondary | ICD-10-CM | POA: Diagnosis not present

## 2018-01-21 DIAGNOSIS — I509 Heart failure, unspecified: Secondary | ICD-10-CM | POA: Diagnosis not present

## 2018-01-25 DIAGNOSIS — M12812 Other specific arthropathies, not elsewhere classified, left shoulder: Secondary | ICD-10-CM | POA: Diagnosis not present

## 2018-01-25 DIAGNOSIS — M12811 Other specific arthropathies, not elsewhere classified, right shoulder: Secondary | ICD-10-CM | POA: Diagnosis not present

## 2018-02-20 DIAGNOSIS — J449 Chronic obstructive pulmonary disease, unspecified: Secondary | ICD-10-CM | POA: Diagnosis not present

## 2018-02-20 DIAGNOSIS — I509 Heart failure, unspecified: Secondary | ICD-10-CM | POA: Diagnosis not present

## 2018-02-21 DIAGNOSIS — J449 Chronic obstructive pulmonary disease, unspecified: Secondary | ICD-10-CM | POA: Diagnosis not present

## 2018-02-21 DIAGNOSIS — Z299 Encounter for prophylactic measures, unspecified: Secondary | ICD-10-CM | POA: Diagnosis not present

## 2018-02-21 DIAGNOSIS — D589 Hereditary hemolytic anemia, unspecified: Secondary | ICD-10-CM | POA: Diagnosis not present

## 2018-02-21 DIAGNOSIS — J42 Unspecified chronic bronchitis: Secondary | ICD-10-CM | POA: Diagnosis not present

## 2018-02-21 DIAGNOSIS — I509 Heart failure, unspecified: Secondary | ICD-10-CM | POA: Diagnosis not present

## 2018-02-21 DIAGNOSIS — Z681 Body mass index (BMI) 19 or less, adult: Secondary | ICD-10-CM | POA: Diagnosis not present

## 2018-02-21 DIAGNOSIS — I10 Essential (primary) hypertension: Secondary | ICD-10-CM | POA: Diagnosis not present

## 2018-03-08 DIAGNOSIS — J449 Chronic obstructive pulmonary disease, unspecified: Secondary | ICD-10-CM | POA: Diagnosis not present

## 2018-03-08 DIAGNOSIS — I2510 Atherosclerotic heart disease of native coronary artery without angina pectoris: Secondary | ICD-10-CM | POA: Diagnosis not present

## 2018-03-08 DIAGNOSIS — I509 Heart failure, unspecified: Secondary | ICD-10-CM | POA: Diagnosis not present

## 2018-03-08 DIAGNOSIS — I10 Essential (primary) hypertension: Secondary | ICD-10-CM | POA: Diagnosis not present

## 2018-03-10 DIAGNOSIS — J449 Chronic obstructive pulmonary disease, unspecified: Secondary | ICD-10-CM | POA: Diagnosis not present

## 2018-03-10 DIAGNOSIS — N184 Chronic kidney disease, stage 4 (severe): Secondary | ICD-10-CM | POA: Diagnosis not present

## 2018-03-10 DIAGNOSIS — Z299 Encounter for prophylactic measures, unspecified: Secondary | ICD-10-CM | POA: Diagnosis not present

## 2018-03-10 DIAGNOSIS — I10 Essential (primary) hypertension: Secondary | ICD-10-CM | POA: Diagnosis not present

## 2018-03-10 DIAGNOSIS — I509 Heart failure, unspecified: Secondary | ICD-10-CM | POA: Diagnosis not present

## 2018-03-10 DIAGNOSIS — Z681 Body mass index (BMI) 19 or less, adult: Secondary | ICD-10-CM | POA: Diagnosis not present

## 2018-03-23 ENCOUNTER — Other Ambulatory Visit: Payer: Self-pay

## 2018-03-23 ENCOUNTER — Emergency Department (HOSPITAL_COMMUNITY)
Admission: EM | Admit: 2018-03-23 | Discharge: 2018-03-23 | Disposition: A | Discharge status: Home / Self Care | Payer: Medicare HMO | Attending: Emergency Medicine | Admitting: Emergency Medicine

## 2018-03-23 ENCOUNTER — Encounter (HOSPITAL_COMMUNITY): Payer: Self-pay | Admitting: Emergency Medicine

## 2018-03-23 ENCOUNTER — Emergency Department (HOSPITAL_COMMUNITY): Payer: Medicare HMO

## 2018-03-23 DIAGNOSIS — Z87891 Personal history of nicotine dependence: Secondary | ICD-10-CM | POA: Insufficient documentation

## 2018-03-23 DIAGNOSIS — R55 Syncope and collapse: Secondary | ICD-10-CM

## 2018-03-23 DIAGNOSIS — R58 Hemorrhage, not elsewhere classified: Secondary | ICD-10-CM | POA: Diagnosis not present

## 2018-03-23 DIAGNOSIS — J449 Chronic obstructive pulmonary disease, unspecified: Secondary | ICD-10-CM | POA: Diagnosis not present

## 2018-03-23 DIAGNOSIS — R079 Chest pain, unspecified: Secondary | ICD-10-CM | POA: Diagnosis not present

## 2018-03-23 DIAGNOSIS — R0689 Other abnormalities of breathing: Secondary | ICD-10-CM | POA: Diagnosis not present

## 2018-03-23 DIAGNOSIS — N189 Chronic kidney disease, unspecified: Secondary | ICD-10-CM | POA: Diagnosis not present

## 2018-03-23 DIAGNOSIS — Z79899 Other long term (current) drug therapy: Secondary | ICD-10-CM | POA: Insufficient documentation

## 2018-03-23 DIAGNOSIS — R0789 Other chest pain: Secondary | ICD-10-CM | POA: Diagnosis not present

## 2018-03-23 DIAGNOSIS — Z951 Presence of aortocoronary bypass graft: Secondary | ICD-10-CM | POA: Diagnosis not present

## 2018-03-23 DIAGNOSIS — I2510 Atherosclerotic heart disease of native coronary artery without angina pectoris: Secondary | ICD-10-CM | POA: Insufficient documentation

## 2018-03-23 DIAGNOSIS — R1084 Generalized abdominal pain: Secondary | ICD-10-CM | POA: Diagnosis not present

## 2018-03-23 DIAGNOSIS — R072 Precordial pain: Secondary | ICD-10-CM | POA: Diagnosis not present

## 2018-03-23 DIAGNOSIS — I509 Heart failure, unspecified: Secondary | ICD-10-CM | POA: Diagnosis not present

## 2018-03-23 HISTORY — DX: Other specified congenital malformations of intestine: Q43.8

## 2018-03-23 LAB — CBC
HEMATOCRIT: 34.1 % — AB (ref 36.0–46.0)
HEMOGLOBIN: 10.8 g/dL — AB (ref 12.0–15.0)
MCH: 28.1 pg (ref 26.0–34.0)
MCHC: 31.7 g/dL (ref 30.0–36.0)
MCV: 88.8 fL (ref 78.0–100.0)
Platelets: 207 10*3/uL (ref 150–400)
RBC: 3.84 MIL/uL — ABNORMAL LOW (ref 3.87–5.11)
RDW: 14.5 % (ref 11.5–15.5)
WBC: 7.2 10*3/uL (ref 4.0–10.5)

## 2018-03-23 LAB — BASIC METABOLIC PANEL
ANION GAP: 7 (ref 5–15)
BUN: 24 mg/dL — ABNORMAL HIGH (ref 8–23)
CO2: 26 mmol/L (ref 22–32)
Calcium: 9.3 mg/dL (ref 8.9–10.3)
Chloride: 101 mmol/L (ref 98–111)
Creatinine, Ser: 1.36 mg/dL — ABNORMAL HIGH (ref 0.44–1.00)
GFR calc Af Amer: 41 mL/min — ABNORMAL LOW (ref >60)
GFR calc non Af Amer: 35 mL/min — ABNORMAL LOW (ref >60)
GLUCOSE: 124 mg/dL — AB (ref 70–99)
POTASSIUM: 5 mmol/L (ref 3.5–5.1)
Sodium: 134 mmol/L — ABNORMAL LOW (ref 135–145)

## 2018-03-23 LAB — TROPONIN I: Troponin I: <0.03 ng/mL (ref <0.03)

## 2018-03-23 NOTE — ED Notes (Signed)
EDP at bedside updating patient and family. 

## 2018-03-23 NOTE — ED Provider Notes (Signed)
[facility] EMERGENCY DEPARTMENT Provider Note   CSN: [id number] Arrival date & time: 03/23/18  1205     History   Chief Complaint Chief Complaint  Patient presents with  . Chest Pain  . Loss of Consciousness    HPI [name] is a 83 y.o. female.  She presents to the emergency department by ambulance after a syncopal event at the hairdresser's.  She states she went there to get her hair done and was in the chair when she experienced some substernal chest pain.  She said it was moderate intensity.  She took a nitro and then soon afterwards was noted to be unresponsive by staff.  EMS got there found her with pulses intact and becoming more alert.  Her fingerstick was normal.  Patient currently feels back to baseline he denies any chest pain shortness of breath dizziness or any other symptoms.  She states last time she took a nitro was probably about a month ago and it did not cause her the symptoms.  She thinks the chest pain may be lasted a few minutes.  The history is provided by the patient and the EMS personnel.  Chest Pain   This is a new problem. The current episode started less than 1 hour ago. The problem has been resolved. The pain is associated with rest. The pain is present in the substernal region. The pain is moderate. The quality of the pain is described as pressure-like. The pain does not radiate. Associated symptoms include cough and syncope. Pertinent negatives include no abdominal pain, no back pain, no fever, no headaches, no hemoptysis, no leg pain, no nausea, no palpitations, no shortness of breath and no vomiting. She has tried nitroglycerin for the symptoms. The treatment provided significant relief.  Loss of Consciousness   Associated symptoms include chest pain. Pertinent negatives include abdominal pain, back pain, fever, headaches, nausea, palpitations and vomiting.    Past Medical History:  Diagnosis Date  . Carotid artery disease ([place])    Doppler,  January, 2010, mild plaque, no major stenoses  . CKD (chronic kidney disease)    Creatinine 1.4, August, 2012  . Congenital prolapsed rectum   . COPD (chronic obstructive pulmonary disease) ([place])    Home O2, Severe pneumonia 2008  . Coronary artery disease    Catheterization November, 2009, 2 of 4 grafts patent,  . 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 Drug therapy    Prednisone intermittently for anemia  . Ejection fraction    EF normal, cath, 2009 /  EF 55-60%, echo, July, 2012, mild septal bounce  . GERD (gastroesophageal reflux disease)   . Gout   . Hemolytic anemia (HCC)    Darovsky, Probable Coombs negative hemolytic anemia, prednisone therapy intermittent  . History of prolonged Q-T interval on ECG    Intermittent  . Hx of CABG    2002  . Mitral regurgitation    Mild  . Osteoporosis   . PAD (peripheral artery disease) (HCC)    Mild, legs  . PVC's (premature ventricular contractions)   . Renal carcinoma ([place])    Treated by freezing by urology, 2012  . Shoulder pain    Rotator cuff  . Syncope    January, 2010, probable orthostatic and volume  /  he vent recorder may, 2011, normal sinus rhythm    Patient Active Problem List   Diagnosis Date Noted  . Dyspnea 01/31/2015  . Upper airway cough syndrome/VCD  01/31/2015  . Renal carcinoma ([place])   . Coronary artery disease   . History of prolonged Q-T interval on ECG   . COPD II   . Hemolytic anemia ([place])   . Drug therapy   . PAD (peripheral artery disease) ([place])   . PVC's (premature ventricular contractions)   . Mitral regurgitation   . Ejection fraction   . Hx of CABG   . Syncope   . Carotid artery disease ([place])   . CKD (chronic kidney disease)   . GERD 01/06/2010    Past Surgical History:  Procedure Laterality Date  . CARDIAC CATHETERIZATION  2009   2/4 occluded grafts most concerning area was the ostieal stenosis of the LM . Both bypass grafts to the anterioro cirulation are occluded.  The mida and distal lLAD gets some filling from the RCA.Stenosis of the LM could potentially sompromise flow to the promixmal and midportion of in the LAD. Intra coronary [place] could be considered should she continue to be symptomatic.  Tx medically for now.  . CORONARY ARTERY BYPASS GRAFT       OB History   None      Home Medications    Prior to Admission medications   Medication Sig Start Date End Date Taking? Authorizing Provider  acetaminophen (TYLENOL) 325 MG tablet Take 650 mg by mouth every 6 (six) hours as needed for mild pain or moderate pain.     [provider]  allopurinol (ZYLOPRIM) 100 MG tablet Take 100 mg by mouth every morning.     [provider]  donepezil (ARICEPT) 10 MG tablet Take 10 mg by mouth daily.    [provider]  LORazepam (ATIVAN) 0.5 MG tablet Take 0.5 mg by mouth 2 (two) times daily as needed for anxiety.    [provider]  metoprolol succinate (TOPROL-XL) 25 MG 24 hr tablet Take 25 mg by mouth every morning.  04/05/16   [provider]  midodrine (PROAMATINE) 5 MG tablet Take 5 mg by mouth every morning.  04/05/16   [provider]  nitroGLYCERIN (NITROSTAT) 0.4 MG SL tablet Place 1 tablet (0.4 mg total) under the tongue every 5 (five) minutes as needed. Patient not taking: Reported on 06/15/2016 02/15/11   Lendon Colonel, NP  omeprazole (PRILOSEC) 20 MG capsule Take 20 mg by mouth daily before breakfast.    [provider]  ondansetron (ZOFRAN ODT) 4 MG disintegrating tablet 4mg  ODT q4 hours prn nausea/vomit 06/15/16   Deno Etienne, DO  OXYGEN Inhale 2 L into the lungs at bedtime.    [provider]  sertraline (ZOLOFT) 50 MG tablet Take 50 mg by mouth every morning.     [provider]    Family History Family History  Problem Relation Age of Onset  . Stroke Father   . Heart attack Brother   . Heart attack Mother     Social History Social History   Tobacco Use  .  Smoking status: Former Smoker    Packs/day: 2.00    Years: 40.00    Pack years: 80.00    Types: Cigarettes    Start date: 08/27/1951    Last attempt to quit: 08/16/1997    Years since quitting: 20.6  . Smokeless tobacco: Never Used  Substance Use Topics  . Alcohol use: No  . Drug use: No     Allergies   Codeine   Review of Systems Review of Systems  Constitutional: Negative for fever.  HENT:  Negative for sore throat.   Eyes: Negative for visual disturbance.  Respiratory: Positive for cough. Negative for hemoptysis and shortness of breath.   Cardiovascular: Positive for chest pain and syncope. Negative for palpitations.  Gastrointestinal: Negative for abdominal pain, nausea and vomiting.  Genitourinary: Negative for dysuria.  Musculoskeletal: Negative for back pain and neck pain.  Skin: Negative for rash.  Neurological: Positive for syncope. Negative for headaches.     Physical Exam Updated Vital Signs BP (!) 162/62 (BP Location: Right Arm)   Pulse 66   Temp 97.8 F (36.6 C) (Oral)   Resp 18   Ht 5\' 3"  (1.6 m)   Wt 48.5 kg   SpO2 97%   BMI 18.95 kg/m   Physical Exam  Constitutional: She appears well-developed and well-nourished. No distress.  HENT:  Head: Normocephalic and atraumatic.  Eyes: Conjunctivae are normal.  Neck: Neck supple.  Cardiovascular: Normal rate, regular rhythm and normal pulses.  No murmur heard. Pulmonary/Chest: Effort normal and breath sounds normal. No respiratory distress.  Abdominal: Soft. There is no tenderness.  Musculoskeletal: Normal range of motion. She exhibits no edema.       Right lower leg: She exhibits no tenderness and no edema.       Left lower leg: She exhibits no tenderness and no edema.  Neurological: She is alert.  Skin: Skin is warm and dry. Capillary refill takes less than 2 seconds.  Psychiatric: She has a normal mood and affect.  Nursing note and vitals reviewed.    ED Treatments / Results  Labs (all labs  ordered are listed, but only abnormal results are displayed) Labs Reviewed  BASIC METABOLIC PANEL - Abnormal; Notable for the following components:      Result Value   Sodium 134 (*)    Glucose, Bld 124 (*)    BUN 24 (*)    Creatinine, Ser 1.36 (*)    GFR calc non Af Amer 35 (*)    GFR calc Af Amer 41 (*)    All other components within normal limits  CBC - Abnormal; Notable for the following components:   RBC 3.84 (*)    Hemoglobin 10.8 (*)    HCT 34.1 (*)    All other components within normal limits  TROPONIN I  TROPONIN I    EKG EKG Interpretation  Date/Time:  Thursday March 23 2018 12:16:45 EDT Ventricular Rate:  65 PR Interval:    QRS Duration: 100 QT Interval:  456 QTC Calculation: 475 R Axis:   78 Text Interpretation:  Sinus rhythm Anteroseptal infarct, age indeterminate similar pattern to prior 7/18 Confirmed by Aletta Edouard [id number]) on 03/23/2018 12:24:15 PM   Radiology Dg Chest 2 View  Result Date: 03/23/2018 CLINICAL DATA:  Chest pain. EXAM: CHEST - 2 VIEW COMPARISON:  Chest x-ray dated April 14, 2016. FINDINGS: The heart size and mediastinal contours are within normal limits. Prior CABG. Normal pulmonary vascularity. Atherosclerotic calcification of the thoracic aorta. The lungs remain hyperinflated with emphysematous changes. No focal consolidation, pleural effusion, or pneumothorax. No acute osseous abnormality. IMPRESSION: COPD.  No active cardiopulmonary disease. Electronically Signed   By: Titus Dubin M.D.   On: 03/23/2018 13:07    Procedures Procedures (including critical care time)  Medications Ordered in ED Medications - No data to display   Initial Impression / Assessment and Plan / ED Course  I have reviewed the triage vital signs and the nursing notes.  Pertinent labs & imaging results that were available during my  care of the patient were reviewed by me and considered in my medical decision making (see chart for details).  Clinical Course  as of Mar 24 950  Thu Mar 23, 5828  [id number] 83 year old female with known history of CAD CABG here after syncopal event.  She had chest pain and took a nitro and then had a syncopal event.  Since EMS arrival she is been awake alert and denies any complaints.  Her blood pressures been good here   [MB]  1621 Patient second troponin negative she is ambulated here has eaten and wants to go home.  I do not see any overwhelming indication that she would need to stay in the hospital right now.  Her son is here and is willing to take her home.   [MB]    Clinical Course User Index [MB] Hayden Rasmussen, MD     Final Clinical Impressions(s) / ED Diagnoses   Final diagnoses:  Nonspecific chest pain  Syncope, unspecified syncope type    ED Discharge Orders    None       Hayden Rasmussen, MD 03/24/18 [phone]

## 2018-03-23 NOTE — ED Notes (Signed)
EDP at bedside  

## 2018-03-23 NOTE — ED Triage Notes (Signed)
Pt was at the salon, c/o epigastric pain that began this morning. Pt took nitro. Pt has a syncopal episode while sitting under hair dryer. Pt AOx4 when EMS arrived. CBG 136. Negative orthostatics per EMS.

## 2018-03-23 NOTE — ED Notes (Signed)
Pt returned from xray

## 2018-03-23 NOTE — Discharge Instructions (Signed)
Your evaluated in the emergency department for an episode of chest pain and a fainting spell.  Your tests here have all not shown an obvious cause of your symptoms.  You were completely back to baseline and felt that he wanted to go home.  It will be important for you to keep well-hydrated and follow-up with your doctor.  Please return if any worsening.

## 2018-04-04 DIAGNOSIS — I209 Angina pectoris, unspecified: Secondary | ICD-10-CM | POA: Diagnosis not present

## 2018-04-04 DIAGNOSIS — M19011 Primary osteoarthritis, right shoulder: Secondary | ICD-10-CM | POA: Diagnosis not present

## 2018-04-04 DIAGNOSIS — F419 Anxiety disorder, unspecified: Secondary | ICD-10-CM | POA: Diagnosis not present

## 2018-04-04 DIAGNOSIS — Z951 Presence of aortocoronary bypass graft: Secondary | ICD-10-CM | POA: Diagnosis not present

## 2018-04-04 DIAGNOSIS — Z681 Body mass index (BMI) 19 or less, adult: Secondary | ICD-10-CM | POA: Diagnosis not present

## 2018-04-04 DIAGNOSIS — I509 Heart failure, unspecified: Secondary | ICD-10-CM | POA: Diagnosis not present

## 2018-04-04 DIAGNOSIS — H9193 Unspecified hearing loss, bilateral: Secondary | ICD-10-CM | POA: Diagnosis not present

## 2018-04-04 DIAGNOSIS — I252 Old myocardial infarction: Secondary | ICD-10-CM | POA: Diagnosis not present

## 2018-04-04 DIAGNOSIS — D589 Hereditary hemolytic anemia, unspecified: Secondary | ICD-10-CM | POA: Diagnosis not present

## 2018-04-04 DIAGNOSIS — I110 Hypertensive heart disease with heart failure: Secondary | ICD-10-CM | POA: Diagnosis not present

## 2018-04-04 DIAGNOSIS — E559 Vitamin D deficiency, unspecified: Secondary | ICD-10-CM | POA: Diagnosis not present

## 2018-04-04 DIAGNOSIS — F330 Major depressive disorder, recurrent, mild: Secondary | ICD-10-CM | POA: Diagnosis not present

## 2018-04-05 DIAGNOSIS — I10 Essential (primary) hypertension: Secondary | ICD-10-CM | POA: Diagnosis not present

## 2018-04-05 DIAGNOSIS — I509 Heart failure, unspecified: Secondary | ICD-10-CM | POA: Diagnosis not present

## 2018-04-05 DIAGNOSIS — Z299 Encounter for prophylactic measures, unspecified: Secondary | ICD-10-CM | POA: Diagnosis not present

## 2018-04-05 DIAGNOSIS — Z681 Body mass index (BMI) 19 or less, adult: Secondary | ICD-10-CM | POA: Diagnosis not present

## 2018-04-05 DIAGNOSIS — S41119A Laceration without foreign body of unspecified upper arm, initial encounter: Secondary | ICD-10-CM | POA: Diagnosis not present

## 2018-04-05 DIAGNOSIS — H1131 Conjunctival hemorrhage, right eye: Secondary | ICD-10-CM | POA: Diagnosis not present

## 2018-04-07 DIAGNOSIS — I2510 Atherosclerotic heart disease of native coronary artery without angina pectoris: Secondary | ICD-10-CM | POA: Diagnosis not present

## 2018-04-07 DIAGNOSIS — I509 Heart failure, unspecified: Secondary | ICD-10-CM | POA: Diagnosis not present

## 2018-04-07 DIAGNOSIS — J449 Chronic obstructive pulmonary disease, unspecified: Secondary | ICD-10-CM | POA: Diagnosis not present

## 2018-04-07 DIAGNOSIS — I10 Essential (primary) hypertension: Secondary | ICD-10-CM | POA: Diagnosis not present

## 2018-04-23 DIAGNOSIS — I509 Heart failure, unspecified: Secondary | ICD-10-CM | POA: Diagnosis not present

## 2018-04-23 DIAGNOSIS — J449 Chronic obstructive pulmonary disease, unspecified: Secondary | ICD-10-CM | POA: Diagnosis not present

## 2018-04-27 DIAGNOSIS — M12812 Other specific arthropathies, not elsewhere classified, left shoulder: Secondary | ICD-10-CM | POA: Diagnosis not present

## 2018-04-27 DIAGNOSIS — M12811 Other specific arthropathies, not elsewhere classified, right shoulder: Secondary | ICD-10-CM | POA: Diagnosis not present

## 2018-05-05 DIAGNOSIS — I2510 Atherosclerotic heart disease of native coronary artery without angina pectoris: Secondary | ICD-10-CM | POA: Diagnosis not present

## 2018-05-05 DIAGNOSIS — I10 Essential (primary) hypertension: Secondary | ICD-10-CM | POA: Diagnosis not present

## 2018-05-05 DIAGNOSIS — J449 Chronic obstructive pulmonary disease, unspecified: Secondary | ICD-10-CM | POA: Diagnosis not present

## 2018-05-05 DIAGNOSIS — I509 Heart failure, unspecified: Secondary | ICD-10-CM | POA: Diagnosis not present

## 2018-05-22 DIAGNOSIS — Z299 Encounter for prophylactic measures, unspecified: Secondary | ICD-10-CM | POA: Diagnosis not present

## 2018-05-22 DIAGNOSIS — J449 Chronic obstructive pulmonary disease, unspecified: Secondary | ICD-10-CM | POA: Diagnosis not present

## 2018-05-22 DIAGNOSIS — Z681 Body mass index (BMI) 19 or less, adult: Secondary | ICD-10-CM | POA: Diagnosis not present

## 2018-05-22 DIAGNOSIS — R42 Dizziness and giddiness: Secondary | ICD-10-CM | POA: Diagnosis not present

## 2018-05-22 DIAGNOSIS — Z23 Encounter for immunization: Secondary | ICD-10-CM | POA: Diagnosis not present

## 2018-05-22 DIAGNOSIS — R51 Headache: Secondary | ICD-10-CM | POA: Diagnosis not present

## 2018-05-22 DIAGNOSIS — I10 Essential (primary) hypertension: Secondary | ICD-10-CM | POA: Diagnosis not present

## 2018-05-23 DIAGNOSIS — I509 Heart failure, unspecified: Secondary | ICD-10-CM | POA: Diagnosis not present

## 2018-05-23 DIAGNOSIS — J449 Chronic obstructive pulmonary disease, unspecified: Secondary | ICD-10-CM | POA: Diagnosis not present

## 2018-05-31 DIAGNOSIS — J449 Chronic obstructive pulmonary disease, unspecified: Secondary | ICD-10-CM | POA: Diagnosis not present

## 2018-05-31 DIAGNOSIS — I10 Essential (primary) hypertension: Secondary | ICD-10-CM | POA: Diagnosis not present

## 2018-05-31 DIAGNOSIS — I509 Heart failure, unspecified: Secondary | ICD-10-CM | POA: Diagnosis not present

## 2018-05-31 DIAGNOSIS — I2510 Atherosclerotic heart disease of native coronary artery without angina pectoris: Secondary | ICD-10-CM | POA: Diagnosis not present

## 2018-06-06 DIAGNOSIS — I7389 Other specified peripheral vascular diseases: Secondary | ICD-10-CM | POA: Diagnosis not present

## 2018-06-06 DIAGNOSIS — H81393 Other peripheral vertigo, bilateral: Secondary | ICD-10-CM | POA: Diagnosis not present

## 2018-06-06 DIAGNOSIS — G9009 Other idiopathic peripheral autonomic neuropathy: Secondary | ICD-10-CM | POA: Diagnosis not present

## 2018-06-15 DIAGNOSIS — J449 Chronic obstructive pulmonary disease, unspecified: Secondary | ICD-10-CM | POA: Diagnosis not present

## 2018-06-15 DIAGNOSIS — I739 Peripheral vascular disease, unspecified: Secondary | ICD-10-CM | POA: Diagnosis not present

## 2018-06-15 DIAGNOSIS — Z299 Encounter for prophylactic measures, unspecified: Secondary | ICD-10-CM | POA: Diagnosis not present

## 2018-06-15 DIAGNOSIS — Z681 Body mass index (BMI) 19 or less, adult: Secondary | ICD-10-CM | POA: Diagnosis not present

## 2018-06-15 DIAGNOSIS — I509 Heart failure, unspecified: Secondary | ICD-10-CM | POA: Diagnosis not present

## 2018-06-15 DIAGNOSIS — R42 Dizziness and giddiness: Secondary | ICD-10-CM | POA: Diagnosis not present

## 2018-06-15 DIAGNOSIS — I10 Essential (primary) hypertension: Secondary | ICD-10-CM | POA: Diagnosis not present

## 2018-06-19 DIAGNOSIS — I70211 Atherosclerosis of native arteries of extremities with intermittent claudication, right leg: Secondary | ICD-10-CM | POA: Diagnosis not present

## 2018-06-19 DIAGNOSIS — I70209 Unspecified atherosclerosis of native arteries of extremities, unspecified extremity: Secondary | ICD-10-CM | POA: Diagnosis not present

## 2018-06-23 DIAGNOSIS — J449 Chronic obstructive pulmonary disease, unspecified: Secondary | ICD-10-CM | POA: Diagnosis not present

## 2018-06-23 DIAGNOSIS — I509 Heart failure, unspecified: Secondary | ICD-10-CM | POA: Diagnosis not present

## 2018-07-07 DIAGNOSIS — J449 Chronic obstructive pulmonary disease, unspecified: Secondary | ICD-10-CM | POA: Diagnosis not present

## 2018-07-07 DIAGNOSIS — I509 Heart failure, unspecified: Secondary | ICD-10-CM | POA: Diagnosis not present

## 2018-07-07 DIAGNOSIS — I10 Essential (primary) hypertension: Secondary | ICD-10-CM | POA: Diagnosis not present

## 2018-07-07 DIAGNOSIS — I2510 Atherosclerotic heart disease of native coronary artery without angina pectoris: Secondary | ICD-10-CM | POA: Diagnosis not present

## 2018-07-12 DIAGNOSIS — R600 Localized edema: Secondary | ICD-10-CM | POA: Diagnosis not present

## 2018-07-12 DIAGNOSIS — I10 Essential (primary) hypertension: Secondary | ICD-10-CM | POA: Diagnosis not present

## 2018-07-12 DIAGNOSIS — J069 Acute upper respiratory infection, unspecified: Secondary | ICD-10-CM | POA: Diagnosis not present

## 2018-07-12 DIAGNOSIS — Z681 Body mass index (BMI) 19 or less, adult: Secondary | ICD-10-CM | POA: Diagnosis not present

## 2018-07-12 DIAGNOSIS — G309 Alzheimer's disease, unspecified: Secondary | ICD-10-CM | POA: Diagnosis not present

## 2018-07-12 DIAGNOSIS — F0280 Dementia in other diseases classified elsewhere without behavioral disturbance: Secondary | ICD-10-CM | POA: Diagnosis not present

## 2018-07-12 DIAGNOSIS — Z299 Encounter for prophylactic measures, unspecified: Secondary | ICD-10-CM | POA: Diagnosis not present

## 2018-07-12 DIAGNOSIS — Z87891 Personal history of nicotine dependence: Secondary | ICD-10-CM | POA: Diagnosis not present

## 2018-07-23 DIAGNOSIS — J449 Chronic obstructive pulmonary disease, unspecified: Secondary | ICD-10-CM | POA: Diagnosis not present

## 2018-07-23 DIAGNOSIS — I509 Heart failure, unspecified: Secondary | ICD-10-CM | POA: Diagnosis not present

## 2018-07-31 DIAGNOSIS — M12811 Other specific arthropathies, not elsewhere classified, right shoulder: Secondary | ICD-10-CM | POA: Diagnosis not present

## 2018-07-31 DIAGNOSIS — M12812 Other specific arthropathies, not elsewhere classified, left shoulder: Secondary | ICD-10-CM | POA: Diagnosis not present

## 2018-08-03 DIAGNOSIS — I2510 Atherosclerotic heart disease of native coronary artery without angina pectoris: Secondary | ICD-10-CM | POA: Diagnosis not present

## 2018-08-03 DIAGNOSIS — J449 Chronic obstructive pulmonary disease, unspecified: Secondary | ICD-10-CM | POA: Diagnosis not present

## 2018-08-03 DIAGNOSIS — I509 Heart failure, unspecified: Secondary | ICD-10-CM | POA: Diagnosis not present

## 2018-08-03 DIAGNOSIS — I10 Essential (primary) hypertension: Secondary | ICD-10-CM | POA: Diagnosis not present

## 2018-08-23 DIAGNOSIS — I509 Heart failure, unspecified: Secondary | ICD-10-CM | POA: Diagnosis not present

## 2018-08-23 DIAGNOSIS — J449 Chronic obstructive pulmonary disease, unspecified: Secondary | ICD-10-CM | POA: Diagnosis not present

## 2018-08-24 DIAGNOSIS — R5383 Other fatigue: Secondary | ICD-10-CM | POA: Diagnosis not present

## 2018-08-24 DIAGNOSIS — Z0000 Encounter for general adult medical examination without abnormal findings: Secondary | ICD-10-CM | POA: Diagnosis not present

## 2018-08-24 DIAGNOSIS — Z299 Encounter for prophylactic measures, unspecified: Secondary | ICD-10-CM | POA: Diagnosis not present

## 2018-08-24 DIAGNOSIS — Z7189 Other specified counseling: Secondary | ICD-10-CM | POA: Diagnosis not present

## 2018-08-24 DIAGNOSIS — Z1331 Encounter for screening for depression: Secondary | ICD-10-CM | POA: Diagnosis not present

## 2018-08-24 DIAGNOSIS — I739 Peripheral vascular disease, unspecified: Secondary | ICD-10-CM | POA: Diagnosis not present

## 2018-08-24 DIAGNOSIS — Z1211 Encounter for screening for malignant neoplasm of colon: Secondary | ICD-10-CM | POA: Diagnosis not present

## 2018-08-24 DIAGNOSIS — E7800 Pure hypercholesterolemia, unspecified: Secondary | ICD-10-CM | POA: Diagnosis not present

## 2018-08-24 DIAGNOSIS — I10 Essential (primary) hypertension: Secondary | ICD-10-CM | POA: Diagnosis not present

## 2018-08-24 DIAGNOSIS — Z79899 Other long term (current) drug therapy: Secondary | ICD-10-CM | POA: Diagnosis not present

## 2018-08-24 DIAGNOSIS — E559 Vitamin D deficiency, unspecified: Secondary | ICD-10-CM | POA: Diagnosis not present

## 2018-08-24 DIAGNOSIS — Z1339 Encounter for screening examination for other mental health and behavioral disorders: Secondary | ICD-10-CM | POA: Diagnosis not present

## 2018-08-24 DIAGNOSIS — Z681 Body mass index (BMI) 19 or less, adult: Secondary | ICD-10-CM | POA: Diagnosis not present

## 2018-08-24 DIAGNOSIS — K623 Rectal prolapse: Secondary | ICD-10-CM | POA: Diagnosis not present

## 2018-08-31 DIAGNOSIS — J449 Chronic obstructive pulmonary disease, unspecified: Secondary | ICD-10-CM | POA: Diagnosis not present

## 2018-08-31 DIAGNOSIS — I10 Essential (primary) hypertension: Secondary | ICD-10-CM | POA: Diagnosis not present

## 2018-08-31 DIAGNOSIS — I509 Heart failure, unspecified: Secondary | ICD-10-CM | POA: Diagnosis not present

## 2018-08-31 DIAGNOSIS — I2510 Atherosclerotic heart disease of native coronary artery without angina pectoris: Secondary | ICD-10-CM | POA: Diagnosis not present

## 2018-09-11 DIAGNOSIS — K623 Rectal prolapse: Secondary | ICD-10-CM | POA: Diagnosis not present

## 2018-09-12 DIAGNOSIS — K623 Rectal prolapse: Secondary | ICD-10-CM | POA: Insufficient documentation

## 2018-09-23 DIAGNOSIS — J449 Chronic obstructive pulmonary disease, unspecified: Secondary | ICD-10-CM | POA: Diagnosis not present

## 2018-09-23 DIAGNOSIS — I509 Heart failure, unspecified: Secondary | ICD-10-CM | POA: Diagnosis not present

## 2018-09-28 DIAGNOSIS — I509 Heart failure, unspecified: Secondary | ICD-10-CM | POA: Diagnosis not present

## 2018-09-28 DIAGNOSIS — E2839 Other primary ovarian failure: Secondary | ICD-10-CM | POA: Diagnosis not present

## 2018-09-28 DIAGNOSIS — I10 Essential (primary) hypertension: Secondary | ICD-10-CM | POA: Diagnosis not present

## 2018-09-28 DIAGNOSIS — I2510 Atherosclerotic heart disease of native coronary artery without angina pectoris: Secondary | ICD-10-CM | POA: Diagnosis not present

## 2018-09-28 DIAGNOSIS — J449 Chronic obstructive pulmonary disease, unspecified: Secondary | ICD-10-CM | POA: Diagnosis not present

## 2018-10-09 DIAGNOSIS — E871 Hypo-osmolality and hyponatremia: Secondary | ICD-10-CM | POA: Diagnosis not present

## 2018-10-09 DIAGNOSIS — F329 Major depressive disorder, single episode, unspecified: Secondary | ICD-10-CM | POA: Diagnosis not present

## 2018-10-09 DIAGNOSIS — K623 Rectal prolapse: Secondary | ICD-10-CM | POA: Diagnosis not present

## 2018-10-09 DIAGNOSIS — J449 Chronic obstructive pulmonary disease, unspecified: Secondary | ICD-10-CM | POA: Diagnosis not present

## 2018-10-09 DIAGNOSIS — I2510 Atherosclerotic heart disease of native coronary artery without angina pectoris: Secondary | ICD-10-CM | POA: Diagnosis not present

## 2018-10-09 DIAGNOSIS — Z01818 Encounter for other preprocedural examination: Secondary | ICD-10-CM | POA: Diagnosis not present

## 2018-10-13 DIAGNOSIS — J449 Chronic obstructive pulmonary disease, unspecified: Secondary | ICD-10-CM | POA: Diagnosis not present

## 2018-10-13 DIAGNOSIS — I509 Heart failure, unspecified: Secondary | ICD-10-CM | POA: Diagnosis not present

## 2018-10-13 DIAGNOSIS — Z79899 Other long term (current) drug therapy: Secondary | ICD-10-CM | POA: Diagnosis not present

## 2018-10-13 DIAGNOSIS — Z01812 Encounter for preprocedural laboratory examination: Secondary | ICD-10-CM | POA: Diagnosis not present

## 2018-10-13 DIAGNOSIS — K623 Rectal prolapse: Secondary | ICD-10-CM | POA: Diagnosis not present

## 2018-10-19 DIAGNOSIS — Z7982 Long term (current) use of aspirin: Secondary | ICD-10-CM | POA: Diagnosis not present

## 2018-10-19 DIAGNOSIS — Z87891 Personal history of nicotine dependence: Secondary | ICD-10-CM | POA: Diagnosis not present

## 2018-10-19 DIAGNOSIS — Z9981 Dependence on supplemental oxygen: Secondary | ICD-10-CM | POA: Diagnosis not present

## 2018-10-19 DIAGNOSIS — Z9049 Acquired absence of other specified parts of digestive tract: Secondary | ICD-10-CM | POA: Diagnosis not present

## 2018-10-19 DIAGNOSIS — R159 Full incontinence of feces: Secondary | ICD-10-CM | POA: Diagnosis not present

## 2018-10-19 DIAGNOSIS — I509 Heart failure, unspecified: Secondary | ICD-10-CM | POA: Diagnosis not present

## 2018-10-19 DIAGNOSIS — I2510 Atherosclerotic heart disease of native coronary artery without angina pectoris: Secondary | ICD-10-CM | POA: Diagnosis not present

## 2018-10-19 DIAGNOSIS — J449 Chronic obstructive pulmonary disease, unspecified: Secondary | ICD-10-CM | POA: Diagnosis not present

## 2018-10-19 DIAGNOSIS — Q438 Other specified congenital malformations of intestine: Secondary | ICD-10-CM | POA: Diagnosis not present

## 2018-10-19 DIAGNOSIS — Z79899 Other long term (current) drug therapy: Secondary | ICD-10-CM | POA: Diagnosis not present

## 2018-10-19 DIAGNOSIS — K623 Rectal prolapse: Secondary | ICD-10-CM | POA: Diagnosis not present

## 2018-10-19 DIAGNOSIS — Z951 Presence of aortocoronary bypass graft: Secondary | ICD-10-CM | POA: Diagnosis not present

## 2018-10-25 ENCOUNTER — Other Ambulatory Visit: Payer: Self-pay

## 2018-10-25 DIAGNOSIS — M12812 Other specific arthropathies, not elsewhere classified, left shoulder: Secondary | ICD-10-CM | POA: Diagnosis not present

## 2018-10-25 DIAGNOSIS — M12811 Other specific arthropathies, not elsewhere classified, right shoulder: Secondary | ICD-10-CM | POA: Diagnosis not present

## 2018-10-25 NOTE — Patient Outreach (Signed)
[facility]) Care Management  10/25/2018  [name] 1935-05-16 [id number]     Transition of Care Referral  Referral Date: 10/25/2018 Referral Source: [facility] Discharge Report Date of Admission: unknown Diagnosis: "rectal prolapse" Date of Discharge: 10/23/2018 Facility: [facility] Insurance: [facility]    Outreach attempt # 1 to patient. Spoke with patient. She stets she is doing and feeling better now. She voices that she had a rough night last night. She reports she was experiencing some "burning" and pain to surgical area. She states that she took two Tylenol tabs and it relieved symptoms. Patient is not on any new meds post discharge. She does not have prescription med due to fear of SEs and her age. Patient voices that pain is being managed at this time. She was advised to notify MD for any worsening and/or unresolved pain or other symptoms. She has supportive family in the home staying with her at present. Family voices that per discharge paperwork MD office to call patient to schedule follow up appt. Family will be assisting patient with transportation to appts. No further RN CM needs or concerns identified at this time.    Plan: RN CM will close case at this time.   Enzo Montgomery, RN,BSN,CCM [place] [facility] Telephonic Care Management Coordinator Direct Phone: [phone] Toll Free: [phone] Fax: [phone]

## 2018-11-22 DIAGNOSIS — J449 Chronic obstructive pulmonary disease, unspecified: Secondary | ICD-10-CM | POA: Diagnosis not present

## 2018-11-22 DIAGNOSIS — I509 Heart failure, unspecified: Secondary | ICD-10-CM | POA: Diagnosis not present

## 2018-11-29 DIAGNOSIS — I10 Essential (primary) hypertension: Secondary | ICD-10-CM | POA: Diagnosis not present

## 2018-11-29 DIAGNOSIS — I509 Heart failure, unspecified: Secondary | ICD-10-CM | POA: Diagnosis not present

## 2018-11-29 DIAGNOSIS — I2510 Atherosclerotic heart disease of native coronary artery without angina pectoris: Secondary | ICD-10-CM | POA: Diagnosis not present

## 2018-11-29 DIAGNOSIS — J449 Chronic obstructive pulmonary disease, unspecified: Secondary | ICD-10-CM | POA: Diagnosis not present

## 2018-11-30 DIAGNOSIS — J449 Chronic obstructive pulmonary disease, unspecified: Secondary | ICD-10-CM | POA: Diagnosis not present

## 2018-11-30 DIAGNOSIS — F419 Anxiety disorder, unspecified: Secondary | ICD-10-CM | POA: Diagnosis not present

## 2018-11-30 DIAGNOSIS — Z681 Body mass index (BMI) 19 or less, adult: Secondary | ICD-10-CM | POA: Diagnosis not present

## 2018-11-30 DIAGNOSIS — Z299 Encounter for prophylactic measures, unspecified: Secondary | ICD-10-CM | POA: Diagnosis not present

## 2018-11-30 DIAGNOSIS — K219 Gastro-esophageal reflux disease without esophagitis: Secondary | ICD-10-CM | POA: Diagnosis not present

## 2018-12-22 DIAGNOSIS — J449 Chronic obstructive pulmonary disease, unspecified: Secondary | ICD-10-CM | POA: Diagnosis not present

## 2018-12-22 DIAGNOSIS — I509 Heart failure, unspecified: Secondary | ICD-10-CM | POA: Diagnosis not present

## 2018-12-25 DIAGNOSIS — K623 Rectal prolapse: Secondary | ICD-10-CM | POA: Diagnosis not present

## 2018-12-27 DIAGNOSIS — I509 Heart failure, unspecified: Secondary | ICD-10-CM | POA: Diagnosis not present

## 2018-12-27 DIAGNOSIS — J449 Chronic obstructive pulmonary disease, unspecified: Secondary | ICD-10-CM | POA: Diagnosis not present

## 2018-12-27 DIAGNOSIS — I2510 Atherosclerotic heart disease of native coronary artery without angina pectoris: Secondary | ICD-10-CM | POA: Diagnosis not present

## 2018-12-27 DIAGNOSIS — I10 Essential (primary) hypertension: Secondary | ICD-10-CM | POA: Diagnosis not present

## 2019-01-15 DIAGNOSIS — M66241 Spontaneous rupture of extensor tendons, right hand: Secondary | ICD-10-CM | POA: Diagnosis not present

## 2019-01-15 DIAGNOSIS — M12812 Other specific arthropathies, not elsewhere classified, left shoulder: Secondary | ICD-10-CM | POA: Diagnosis not present

## 2019-01-15 DIAGNOSIS — M12811 Other specific arthropathies, not elsewhere classified, right shoulder: Secondary | ICD-10-CM | POA: Diagnosis not present

## 2019-01-16 DIAGNOSIS — M12811 Other specific arthropathies, not elsewhere classified, right shoulder: Secondary | ICD-10-CM | POA: Diagnosis not present

## 2019-01-16 DIAGNOSIS — M12812 Other specific arthropathies, not elsewhere classified, left shoulder: Secondary | ICD-10-CM | POA: Diagnosis not present

## 2019-01-22 DIAGNOSIS — I509 Heart failure, unspecified: Secondary | ICD-10-CM | POA: Diagnosis not present

## 2019-01-22 DIAGNOSIS — S66811A Strain of other specified muscles, fascia and tendons at wrist and hand level, right hand, initial encounter: Secondary | ICD-10-CM | POA: Diagnosis not present

## 2019-01-22 DIAGNOSIS — M79641 Pain in right hand: Secondary | ICD-10-CM | POA: Diagnosis not present

## 2019-01-22 DIAGNOSIS — J449 Chronic obstructive pulmonary disease, unspecified: Secondary | ICD-10-CM | POA: Diagnosis not present

## 2019-01-24 ENCOUNTER — Other Ambulatory Visit: Payer: Self-pay | Admitting: Orthopedic Surgery

## 2019-02-01 DIAGNOSIS — M12812 Other specific arthropathies, not elsewhere classified, left shoulder: Secondary | ICD-10-CM | POA: Diagnosis not present

## 2019-02-01 DIAGNOSIS — M792 Neuralgia and neuritis, unspecified: Secondary | ICD-10-CM | POA: Insufficient documentation

## 2019-02-01 DIAGNOSIS — M12811 Other specific arthropathies, not elsewhere classified, right shoulder: Secondary | ICD-10-CM | POA: Diagnosis not present

## 2019-02-05 DIAGNOSIS — Z299 Encounter for prophylactic measures, unspecified: Secondary | ICD-10-CM | POA: Diagnosis not present

## 2019-02-05 DIAGNOSIS — R609 Edema, unspecified: Secondary | ICD-10-CM | POA: Diagnosis not present

## 2019-02-05 DIAGNOSIS — I10 Essential (primary) hypertension: Secondary | ICD-10-CM | POA: Diagnosis not present

## 2019-02-05 DIAGNOSIS — H6120 Impacted cerumen, unspecified ear: Secondary | ICD-10-CM | POA: Diagnosis not present

## 2019-02-05 DIAGNOSIS — I509 Heart failure, unspecified: Secondary | ICD-10-CM | POA: Diagnosis not present

## 2019-02-05 DIAGNOSIS — Z681 Body mass index (BMI) 19 or less, adult: Secondary | ICD-10-CM | POA: Diagnosis not present

## 2019-02-06 ENCOUNTER — Ambulatory Visit (HOSPITAL_BASED_OUTPATIENT_CLINIC_OR_DEPARTMENT_OTHER): Admit: 2019-02-06 | Payer: Medicare HMO | Admitting: Orthopedic Surgery

## 2019-02-06 ENCOUNTER — Encounter (HOSPITAL_BASED_OUTPATIENT_CLINIC_OR_DEPARTMENT_OTHER): Payer: Self-pay

## 2019-02-06 DIAGNOSIS — K623 Rectal prolapse: Secondary | ICD-10-CM | POA: Diagnosis not present

## 2019-02-06 SURGERY — REPAIR, TENDON, EXTENSOR
Anesthesia: Regional | Laterality: Right

## 2019-02-07 DIAGNOSIS — J449 Chronic obstructive pulmonary disease, unspecified: Secondary | ICD-10-CM | POA: Diagnosis not present

## 2019-02-07 DIAGNOSIS — I2510 Atherosclerotic heart disease of native coronary artery without angina pectoris: Secondary | ICD-10-CM | POA: Diagnosis not present

## 2019-02-07 DIAGNOSIS — I509 Heart failure, unspecified: Secondary | ICD-10-CM | POA: Diagnosis not present

## 2019-02-07 DIAGNOSIS — I10 Essential (primary) hypertension: Secondary | ICD-10-CM | POA: Diagnosis not present

## 2019-02-21 DIAGNOSIS — J449 Chronic obstructive pulmonary disease, unspecified: Secondary | ICD-10-CM | POA: Diagnosis not present

## 2019-02-21 DIAGNOSIS — I509 Heart failure, unspecified: Secondary | ICD-10-CM | POA: Diagnosis not present

## 2019-03-23 DIAGNOSIS — I2510 Atherosclerotic heart disease of native coronary artery without angina pectoris: Secondary | ICD-10-CM | POA: Diagnosis not present

## 2019-03-23 DIAGNOSIS — I10 Essential (primary) hypertension: Secondary | ICD-10-CM | POA: Diagnosis not present

## 2019-03-23 DIAGNOSIS — I509 Heart failure, unspecified: Secondary | ICD-10-CM | POA: Diagnosis not present

## 2019-03-23 DIAGNOSIS — J449 Chronic obstructive pulmonary disease, unspecified: Secondary | ICD-10-CM | POA: Diagnosis not present

## 2019-03-24 DIAGNOSIS — I509 Heart failure, unspecified: Secondary | ICD-10-CM | POA: Diagnosis not present

## 2019-03-24 DIAGNOSIS — J449 Chronic obstructive pulmonary disease, unspecified: Secondary | ICD-10-CM | POA: Diagnosis not present

## 2019-04-09 DIAGNOSIS — I509 Heart failure, unspecified: Secondary | ICD-10-CM | POA: Diagnosis not present

## 2019-04-09 DIAGNOSIS — I10 Essential (primary) hypertension: Secondary | ICD-10-CM | POA: Diagnosis not present

## 2019-04-09 DIAGNOSIS — I739 Peripheral vascular disease, unspecified: Secondary | ICD-10-CM | POA: Diagnosis not present

## 2019-04-09 DIAGNOSIS — M940 Chondrocostal junction syndrome [Tietze]: Secondary | ICD-10-CM | POA: Diagnosis not present

## 2019-04-09 DIAGNOSIS — Z299 Encounter for prophylactic measures, unspecified: Secondary | ICD-10-CM | POA: Diagnosis not present

## 2019-04-09 DIAGNOSIS — J449 Chronic obstructive pulmonary disease, unspecified: Secondary | ICD-10-CM | POA: Diagnosis not present

## 2019-04-09 DIAGNOSIS — J329 Chronic sinusitis, unspecified: Secondary | ICD-10-CM | POA: Diagnosis not present

## 2019-04-09 DIAGNOSIS — D589 Hereditary hemolytic anemia, unspecified: Secondary | ICD-10-CM | POA: Diagnosis not present

## 2019-04-09 DIAGNOSIS — M109 Gout, unspecified: Secondary | ICD-10-CM | POA: Diagnosis not present

## 2019-04-09 DIAGNOSIS — Z681 Body mass index (BMI) 19 or less, adult: Secondary | ICD-10-CM | POA: Diagnosis not present

## 2019-04-09 DIAGNOSIS — H6120 Impacted cerumen, unspecified ear: Secondary | ICD-10-CM | POA: Diagnosis not present

## 2019-04-17 DIAGNOSIS — M12811 Other specific arthropathies, not elsewhere classified, right shoulder: Secondary | ICD-10-CM | POA: Diagnosis not present

## 2019-04-17 DIAGNOSIS — M12812 Other specific arthropathies, not elsewhere classified, left shoulder: Secondary | ICD-10-CM | POA: Diagnosis not present

## 2019-04-19 DIAGNOSIS — I509 Heart failure, unspecified: Secondary | ICD-10-CM | POA: Diagnosis not present

## 2019-04-19 DIAGNOSIS — I10 Essential (primary) hypertension: Secondary | ICD-10-CM | POA: Diagnosis not present

## 2019-04-19 DIAGNOSIS — I2510 Atherosclerotic heart disease of native coronary artery without angina pectoris: Secondary | ICD-10-CM | POA: Diagnosis not present

## 2019-04-19 DIAGNOSIS — J449 Chronic obstructive pulmonary disease, unspecified: Secondary | ICD-10-CM | POA: Diagnosis not present

## 2019-04-24 DIAGNOSIS — J449 Chronic obstructive pulmonary disease, unspecified: Secondary | ICD-10-CM | POA: Diagnosis not present

## 2019-04-24 DIAGNOSIS — I509 Heart failure, unspecified: Secondary | ICD-10-CM | POA: Diagnosis not present

## 2019-05-03 DIAGNOSIS — D589 Hereditary hemolytic anemia, unspecified: Secondary | ICD-10-CM | POA: Diagnosis not present

## 2019-05-03 DIAGNOSIS — M2550 Pain in unspecified joint: Secondary | ICD-10-CM | POA: Diagnosis not present

## 2019-05-03 DIAGNOSIS — M109 Gout, unspecified: Secondary | ICD-10-CM | POA: Diagnosis not present

## 2019-05-03 DIAGNOSIS — R5383 Other fatigue: Secondary | ICD-10-CM | POA: Diagnosis not present

## 2019-05-11 DIAGNOSIS — M109 Gout, unspecified: Secondary | ICD-10-CM | POA: Diagnosis not present

## 2019-05-11 DIAGNOSIS — Z299 Encounter for prophylactic measures, unspecified: Secondary | ICD-10-CM | POA: Diagnosis not present

## 2019-05-11 DIAGNOSIS — F0280 Dementia in other diseases classified elsewhere without behavioral disturbance: Secondary | ICD-10-CM | POA: Diagnosis not present

## 2019-05-11 DIAGNOSIS — Z681 Body mass index (BMI) 19 or less, adult: Secondary | ICD-10-CM | POA: Diagnosis not present

## 2019-05-11 DIAGNOSIS — Z23 Encounter for immunization: Secondary | ICD-10-CM | POA: Diagnosis not present

## 2019-05-11 DIAGNOSIS — G309 Alzheimer's disease, unspecified: Secondary | ICD-10-CM | POA: Diagnosis not present

## 2019-05-11 DIAGNOSIS — I10 Essential (primary) hypertension: Secondary | ICD-10-CM | POA: Diagnosis not present

## 2019-05-21 DIAGNOSIS — I2510 Atherosclerotic heart disease of native coronary artery without angina pectoris: Secondary | ICD-10-CM | POA: Diagnosis not present

## 2019-05-21 DIAGNOSIS — I509 Heart failure, unspecified: Secondary | ICD-10-CM | POA: Diagnosis not present

## 2019-05-21 DIAGNOSIS — J449 Chronic obstructive pulmonary disease, unspecified: Secondary | ICD-10-CM | POA: Diagnosis not present

## 2019-05-21 DIAGNOSIS — I10 Essential (primary) hypertension: Secondary | ICD-10-CM | POA: Diagnosis not present

## 2019-05-24 DIAGNOSIS — I509 Heart failure, unspecified: Secondary | ICD-10-CM | POA: Diagnosis not present

## 2019-05-24 DIAGNOSIS — J449 Chronic obstructive pulmonary disease, unspecified: Secondary | ICD-10-CM | POA: Diagnosis not present

## 2019-06-13 DIAGNOSIS — Z299 Encounter for prophylactic measures, unspecified: Secondary | ICD-10-CM | POA: Diagnosis not present

## 2019-06-13 DIAGNOSIS — B029 Zoster without complications: Secondary | ICD-10-CM | POA: Diagnosis not present

## 2019-06-13 DIAGNOSIS — Z713 Dietary counseling and surveillance: Secondary | ICD-10-CM | POA: Diagnosis not present

## 2019-06-13 DIAGNOSIS — Z681 Body mass index (BMI) 19 or less, adult: Secondary | ICD-10-CM | POA: Diagnosis not present

## 2019-06-13 DIAGNOSIS — I10 Essential (primary) hypertension: Secondary | ICD-10-CM | POA: Diagnosis not present

## 2019-06-24 DIAGNOSIS — J449 Chronic obstructive pulmonary disease, unspecified: Secondary | ICD-10-CM | POA: Diagnosis not present

## 2019-06-24 DIAGNOSIS — I509 Heart failure, unspecified: Secondary | ICD-10-CM | POA: Diagnosis not present

## 2019-06-28 DIAGNOSIS — I509 Heart failure, unspecified: Secondary | ICD-10-CM | POA: Diagnosis not present

## 2019-06-28 DIAGNOSIS — J449 Chronic obstructive pulmonary disease, unspecified: Secondary | ICD-10-CM | POA: Diagnosis not present

## 2019-06-28 DIAGNOSIS — I2510 Atherosclerotic heart disease of native coronary artery without angina pectoris: Secondary | ICD-10-CM | POA: Diagnosis not present

## 2019-06-28 DIAGNOSIS — I10 Essential (primary) hypertension: Secondary | ICD-10-CM | POA: Diagnosis not present

## 2019-07-24 DIAGNOSIS — I509 Heart failure, unspecified: Secondary | ICD-10-CM | POA: Diagnosis not present

## 2019-07-24 DIAGNOSIS — J449 Chronic obstructive pulmonary disease, unspecified: Secondary | ICD-10-CM | POA: Diagnosis not present

## 2019-07-25 DIAGNOSIS — M12812 Other specific arthropathies, not elsewhere classified, left shoulder: Secondary | ICD-10-CM | POA: Diagnosis not present

## 2019-07-25 DIAGNOSIS — M12811 Other specific arthropathies, not elsewhere classified, right shoulder: Secondary | ICD-10-CM | POA: Diagnosis not present

## 2019-08-01 DIAGNOSIS — Z299 Encounter for prophylactic measures, unspecified: Secondary | ICD-10-CM | POA: Diagnosis not present

## 2019-08-01 DIAGNOSIS — D589 Hereditary hemolytic anemia, unspecified: Secondary | ICD-10-CM | POA: Diagnosis not present

## 2019-08-01 DIAGNOSIS — I509 Heart failure, unspecified: Secondary | ICD-10-CM | POA: Diagnosis not present

## 2019-08-01 DIAGNOSIS — R519 Headache, unspecified: Secondary | ICD-10-CM | POA: Diagnosis not present

## 2019-08-01 DIAGNOSIS — J019 Acute sinusitis, unspecified: Secondary | ICD-10-CM | POA: Diagnosis not present

## 2019-08-01 DIAGNOSIS — Z681 Body mass index (BMI) 19 or less, adult: Secondary | ICD-10-CM | POA: Diagnosis not present

## 2019-08-24 DIAGNOSIS — J449 Chronic obstructive pulmonary disease, unspecified: Secondary | ICD-10-CM | POA: Diagnosis not present

## 2019-08-24 DIAGNOSIS — I509 Heart failure, unspecified: Secondary | ICD-10-CM | POA: Diagnosis not present

## 2019-08-27 DIAGNOSIS — I10 Essential (primary) hypertension: Secondary | ICD-10-CM | POA: Diagnosis not present

## 2019-08-27 DIAGNOSIS — J449 Chronic obstructive pulmonary disease, unspecified: Secondary | ICD-10-CM | POA: Diagnosis not present

## 2019-08-27 DIAGNOSIS — I2510 Atherosclerotic heart disease of native coronary artery without angina pectoris: Secondary | ICD-10-CM | POA: Diagnosis not present

## 2019-08-27 DIAGNOSIS — I509 Heart failure, unspecified: Secondary | ICD-10-CM | POA: Diagnosis not present

## 2019-08-28 DIAGNOSIS — J019 Acute sinusitis, unspecified: Secondary | ICD-10-CM | POA: Diagnosis not present

## 2019-08-28 DIAGNOSIS — F0280 Dementia in other diseases classified elsewhere without behavioral disturbance: Secondary | ICD-10-CM | POA: Diagnosis not present

## 2019-08-28 DIAGNOSIS — R5383 Other fatigue: Secondary | ICD-10-CM | POA: Diagnosis not present

## 2019-08-28 DIAGNOSIS — Z0000 Encounter for general adult medical examination without abnormal findings: Secondary | ICD-10-CM | POA: Diagnosis not present

## 2019-08-28 DIAGNOSIS — E559 Vitamin D deficiency, unspecified: Secondary | ICD-10-CM | POA: Diagnosis not present

## 2019-08-28 DIAGNOSIS — Z1331 Encounter for screening for depression: Secondary | ICD-10-CM | POA: Diagnosis not present

## 2019-08-28 DIAGNOSIS — M1990 Unspecified osteoarthritis, unspecified site: Secondary | ICD-10-CM | POA: Diagnosis not present

## 2019-08-28 DIAGNOSIS — Z79899 Other long term (current) drug therapy: Secondary | ICD-10-CM | POA: Diagnosis not present

## 2019-08-28 DIAGNOSIS — G309 Alzheimer's disease, unspecified: Secondary | ICD-10-CM | POA: Diagnosis not present

## 2019-08-28 DIAGNOSIS — J449 Chronic obstructive pulmonary disease, unspecified: Secondary | ICD-10-CM | POA: Diagnosis not present

## 2019-08-28 DIAGNOSIS — Z7189 Other specified counseling: Secondary | ICD-10-CM | POA: Diagnosis not present

## 2019-08-28 DIAGNOSIS — I509 Heart failure, unspecified: Secondary | ICD-10-CM | POA: Diagnosis not present

## 2019-08-28 DIAGNOSIS — Z1211 Encounter for screening for malignant neoplasm of colon: Secondary | ICD-10-CM | POA: Diagnosis not present

## 2019-08-28 DIAGNOSIS — E7800 Pure hypercholesterolemia, unspecified: Secondary | ICD-10-CM | POA: Diagnosis not present

## 2019-08-28 DIAGNOSIS — I739 Peripheral vascular disease, unspecified: Secondary | ICD-10-CM | POA: Diagnosis not present

## 2019-08-28 DIAGNOSIS — Z1339 Encounter for screening examination for other mental health and behavioral disorders: Secondary | ICD-10-CM | POA: Diagnosis not present

## 2019-09-24 DIAGNOSIS — J449 Chronic obstructive pulmonary disease, unspecified: Secondary | ICD-10-CM | POA: Diagnosis not present

## 2019-09-24 DIAGNOSIS — I509 Heart failure, unspecified: Secondary | ICD-10-CM | POA: Diagnosis not present

## 2019-09-27 DIAGNOSIS — J449 Chronic obstructive pulmonary disease, unspecified: Secondary | ICD-10-CM | POA: Diagnosis not present

## 2019-09-27 DIAGNOSIS — I10 Essential (primary) hypertension: Secondary | ICD-10-CM | POA: Diagnosis not present

## 2019-09-27 DIAGNOSIS — I509 Heart failure, unspecified: Secondary | ICD-10-CM | POA: Diagnosis not present

## 2019-09-27 DIAGNOSIS — I2510 Atherosclerotic heart disease of native coronary artery without angina pectoris: Secondary | ICD-10-CM | POA: Diagnosis not present

## 2019-09-28 DIAGNOSIS — Z299 Encounter for prophylactic measures, unspecified: Secondary | ICD-10-CM | POA: Diagnosis not present

## 2019-09-28 DIAGNOSIS — I10 Essential (primary) hypertension: Secondary | ICD-10-CM | POA: Diagnosis not present

## 2019-09-28 DIAGNOSIS — G309 Alzheimer's disease, unspecified: Secondary | ICD-10-CM | POA: Diagnosis not present

## 2019-09-28 DIAGNOSIS — I739 Peripheral vascular disease, unspecified: Secondary | ICD-10-CM | POA: Diagnosis not present

## 2019-09-28 DIAGNOSIS — Z87891 Personal history of nicotine dependence: Secondary | ICD-10-CM | POA: Diagnosis not present

## 2019-09-28 DIAGNOSIS — Z681 Body mass index (BMI) 19 or less, adult: Secondary | ICD-10-CM | POA: Diagnosis not present

## 2019-09-28 DIAGNOSIS — F0280 Dementia in other diseases classified elsewhere without behavioral disturbance: Secondary | ICD-10-CM | POA: Diagnosis not present

## 2019-10-22 DIAGNOSIS — I509 Heart failure, unspecified: Secondary | ICD-10-CM | POA: Diagnosis not present

## 2019-10-22 DIAGNOSIS — J449 Chronic obstructive pulmonary disease, unspecified: Secondary | ICD-10-CM | POA: Diagnosis not present

## 2019-10-24 DIAGNOSIS — M12812 Other specific arthropathies, not elsewhere classified, left shoulder: Secondary | ICD-10-CM | POA: Diagnosis not present

## 2019-10-24 DIAGNOSIS — M12811 Other specific arthropathies, not elsewhere classified, right shoulder: Secondary | ICD-10-CM | POA: Diagnosis not present

## 2019-10-26 DIAGNOSIS — I10 Essential (primary) hypertension: Secondary | ICD-10-CM | POA: Diagnosis not present

## 2019-10-26 DIAGNOSIS — Z299 Encounter for prophylactic measures, unspecified: Secondary | ICD-10-CM | POA: Diagnosis not present

## 2019-10-26 DIAGNOSIS — M19019 Primary osteoarthritis, unspecified shoulder: Secondary | ICD-10-CM | POA: Diagnosis not present

## 2019-10-26 DIAGNOSIS — M109 Gout, unspecified: Secondary | ICD-10-CM | POA: Diagnosis not present

## 2019-10-26 DIAGNOSIS — R609 Edema, unspecified: Secondary | ICD-10-CM | POA: Diagnosis not present

## 2019-11-05 DIAGNOSIS — I2510 Atherosclerotic heart disease of native coronary artery without angina pectoris: Secondary | ICD-10-CM | POA: Diagnosis not present

## 2019-11-05 DIAGNOSIS — J449 Chronic obstructive pulmonary disease, unspecified: Secondary | ICD-10-CM | POA: Diagnosis not present

## 2019-11-05 DIAGNOSIS — I10 Essential (primary) hypertension: Secondary | ICD-10-CM | POA: Diagnosis not present

## 2019-11-05 DIAGNOSIS — I509 Heart failure, unspecified: Secondary | ICD-10-CM | POA: Diagnosis not present

## 2019-11-20 DIAGNOSIS — M12811 Other specific arthropathies, not elsewhere classified, right shoulder: Secondary | ICD-10-CM | POA: Diagnosis not present

## 2019-11-20 DIAGNOSIS — M25512 Pain in left shoulder: Secondary | ICD-10-CM | POA: Diagnosis not present

## 2019-11-20 DIAGNOSIS — M12812 Other specific arthropathies, not elsewhere classified, left shoulder: Secondary | ICD-10-CM | POA: Diagnosis not present

## 2019-11-22 DIAGNOSIS — I509 Heart failure, unspecified: Secondary | ICD-10-CM | POA: Diagnosis not present

## 2019-11-22 DIAGNOSIS — J449 Chronic obstructive pulmonary disease, unspecified: Secondary | ICD-10-CM | POA: Diagnosis not present

## 2019-12-03 DIAGNOSIS — I509 Heart failure, unspecified: Secondary | ICD-10-CM | POA: Diagnosis not present

## 2019-12-03 DIAGNOSIS — J449 Chronic obstructive pulmonary disease, unspecified: Secondary | ICD-10-CM | POA: Diagnosis not present

## 2019-12-03 DIAGNOSIS — I2510 Atherosclerotic heart disease of native coronary artery without angina pectoris: Secondary | ICD-10-CM | POA: Diagnosis not present

## 2019-12-03 DIAGNOSIS — I10 Essential (primary) hypertension: Secondary | ICD-10-CM | POA: Diagnosis not present

## 2019-12-10 DIAGNOSIS — G309 Alzheimer's disease, unspecified: Secondary | ICD-10-CM | POA: Diagnosis not present

## 2019-12-10 DIAGNOSIS — R079 Chest pain, unspecified: Secondary | ICD-10-CM | POA: Diagnosis not present

## 2019-12-10 DIAGNOSIS — I10 Essential (primary) hypertension: Secondary | ICD-10-CM | POA: Diagnosis not present

## 2019-12-10 DIAGNOSIS — F0280 Dementia in other diseases classified elsewhere without behavioral disturbance: Secondary | ICD-10-CM | POA: Diagnosis not present

## 2019-12-10 DIAGNOSIS — Z299 Encounter for prophylactic measures, unspecified: Secondary | ICD-10-CM | POA: Diagnosis not present

## 2019-12-22 DIAGNOSIS — I509 Heart failure, unspecified: Secondary | ICD-10-CM | POA: Diagnosis not present

## 2019-12-22 DIAGNOSIS — J449 Chronic obstructive pulmonary disease, unspecified: Secondary | ICD-10-CM | POA: Diagnosis not present

## 2019-12-28 DIAGNOSIS — M791 Myalgia, unspecified site: Secondary | ICD-10-CM | POA: Diagnosis not present

## 2019-12-28 DIAGNOSIS — I10 Essential (primary) hypertension: Secondary | ICD-10-CM | POA: Diagnosis not present

## 2019-12-28 DIAGNOSIS — J449 Chronic obstructive pulmonary disease, unspecified: Secondary | ICD-10-CM | POA: Diagnosis not present

## 2019-12-28 DIAGNOSIS — R42 Dizziness and giddiness: Secondary | ICD-10-CM | POA: Diagnosis not present

## 2019-12-28 DIAGNOSIS — J45909 Unspecified asthma, uncomplicated: Secondary | ICD-10-CM | POA: Diagnosis not present

## 2019-12-28 DIAGNOSIS — Z299 Encounter for prophylactic measures, unspecified: Secondary | ICD-10-CM | POA: Diagnosis not present

## 2020-01-13 DIAGNOSIS — I10 Essential (primary) hypertension: Secondary | ICD-10-CM | POA: Diagnosis not present

## 2020-01-13 DIAGNOSIS — J449 Chronic obstructive pulmonary disease, unspecified: Secondary | ICD-10-CM | POA: Diagnosis not present

## 2020-01-13 DIAGNOSIS — I509 Heart failure, unspecified: Secondary | ICD-10-CM | POA: Diagnosis not present

## 2020-01-13 DIAGNOSIS — I2510 Atherosclerotic heart disease of native coronary artery without angina pectoris: Secondary | ICD-10-CM | POA: Diagnosis not present

## 2020-01-16 DIAGNOSIS — Z951 Presence of aortocoronary bypass graft: Secondary | ICD-10-CM | POA: Diagnosis not present

## 2020-01-16 DIAGNOSIS — I255 Ischemic cardiomyopathy: Secondary | ICD-10-CM | POA: Diagnosis not present

## 2020-01-16 DIAGNOSIS — I509 Heart failure, unspecified: Secondary | ICD-10-CM | POA: Diagnosis not present

## 2020-01-16 DIAGNOSIS — I340 Nonrheumatic mitral (valve) insufficiency: Secondary | ICD-10-CM | POA: Diagnosis not present

## 2020-01-16 DIAGNOSIS — I2510 Atherosclerotic heart disease of native coronary artery without angina pectoris: Secondary | ICD-10-CM | POA: Diagnosis not present

## 2020-01-22 DIAGNOSIS — J449 Chronic obstructive pulmonary disease, unspecified: Secondary | ICD-10-CM | POA: Diagnosis not present

## 2020-01-22 DIAGNOSIS — I509 Heart failure, unspecified: Secondary | ICD-10-CM | POA: Diagnosis not present

## 2020-01-28 DIAGNOSIS — M12812 Other specific arthropathies, not elsewhere classified, left shoulder: Secondary | ICD-10-CM | POA: Diagnosis not present

## 2020-01-28 DIAGNOSIS — M12811 Other specific arthropathies, not elsewhere classified, right shoulder: Secondary | ICD-10-CM | POA: Diagnosis not present

## 2020-02-04 DIAGNOSIS — R42 Dizziness and giddiness: Secondary | ICD-10-CM | POA: Diagnosis not present

## 2020-02-04 DIAGNOSIS — R600 Localized edema: Secondary | ICD-10-CM | POA: Diagnosis not present

## 2020-02-04 DIAGNOSIS — I509 Heart failure, unspecified: Secondary | ICD-10-CM | POA: Diagnosis not present

## 2020-02-13 DIAGNOSIS — I509 Heart failure, unspecified: Secondary | ICD-10-CM | POA: Diagnosis not present

## 2020-02-13 DIAGNOSIS — J449 Chronic obstructive pulmonary disease, unspecified: Secondary | ICD-10-CM | POA: Diagnosis not present

## 2020-02-13 DIAGNOSIS — I2510 Atherosclerotic heart disease of native coronary artery without angina pectoris: Secondary | ICD-10-CM | POA: Diagnosis not present

## 2020-02-13 DIAGNOSIS — I10 Essential (primary) hypertension: Secondary | ICD-10-CM | POA: Diagnosis not present

## 2020-02-21 DIAGNOSIS — Z299 Encounter for prophylactic measures, unspecified: Secondary | ICD-10-CM | POA: Diagnosis not present

## 2020-02-21 DIAGNOSIS — Z681 Body mass index (BMI) 19 or less, adult: Secondary | ICD-10-CM | POA: Diagnosis not present

## 2020-02-21 DIAGNOSIS — I509 Heart failure, unspecified: Secondary | ICD-10-CM | POA: Diagnosis not present

## 2020-02-21 DIAGNOSIS — N184 Chronic kidney disease, stage 4 (severe): Secondary | ICD-10-CM | POA: Diagnosis not present

## 2020-02-21 DIAGNOSIS — J449 Chronic obstructive pulmonary disease, unspecified: Secondary | ICD-10-CM | POA: Diagnosis not present

## 2020-02-21 DIAGNOSIS — B0229 Other postherpetic nervous system involvement: Secondary | ICD-10-CM | POA: Diagnosis not present

## 2020-02-21 DIAGNOSIS — I10 Essential (primary) hypertension: Secondary | ICD-10-CM | POA: Diagnosis not present

## 2020-03-14 DIAGNOSIS — J449 Chronic obstructive pulmonary disease, unspecified: Secondary | ICD-10-CM | POA: Diagnosis not present

## 2020-03-14 DIAGNOSIS — I10 Essential (primary) hypertension: Secondary | ICD-10-CM | POA: Diagnosis not present

## 2020-03-14 DIAGNOSIS — I2510 Atherosclerotic heart disease of native coronary artery without angina pectoris: Secondary | ICD-10-CM | POA: Diagnosis not present

## 2020-03-14 DIAGNOSIS — I509 Heart failure, unspecified: Secondary | ICD-10-CM | POA: Diagnosis not present

## 2020-03-16 DIAGNOSIS — C7931 Secondary malignant neoplasm of brain: Secondary | ICD-10-CM | POA: Diagnosis not present

## 2020-03-16 DIAGNOSIS — F0280 Dementia in other diseases classified elsewhere without behavioral disturbance: Secondary | ICD-10-CM | POA: Diagnosis not present

## 2020-03-16 DIAGNOSIS — Y999 Unspecified external cause status: Secondary | ICD-10-CM | POA: Diagnosis not present

## 2020-03-16 DIAGNOSIS — F419 Anxiety disorder, unspecified: Secondary | ICD-10-CM | POA: Diagnosis not present

## 2020-03-16 DIAGNOSIS — S52502A Unspecified fracture of the lower end of left radius, initial encounter for closed fracture: Secondary | ICD-10-CM | POA: Diagnosis not present

## 2020-03-16 DIAGNOSIS — M6281 Muscle weakness (generalized): Secondary | ICD-10-CM | POA: Diagnosis not present

## 2020-03-16 DIAGNOSIS — S52602D Unspecified fracture of lower end of left ulna, subsequent encounter for closed fracture with routine healing: Secondary | ICD-10-CM | POA: Diagnosis not present

## 2020-03-16 DIAGNOSIS — M109 Gout, unspecified: Secondary | ICD-10-CM | POA: Diagnosis not present

## 2020-03-16 DIAGNOSIS — H9190 Unspecified hearing loss, unspecified ear: Secondary | ICD-10-CM | POA: Diagnosis not present

## 2020-03-16 DIAGNOSIS — S6992XA Unspecified injury of left wrist, hand and finger(s), initial encounter: Secondary | ICD-10-CM | POA: Diagnosis not present

## 2020-03-16 DIAGNOSIS — S52322A Displaced transverse fracture of shaft of left radius, initial encounter for closed fracture: Secondary | ICD-10-CM | POA: Diagnosis not present

## 2020-03-16 DIAGNOSIS — S22059A Unspecified fracture of T5-T6 vertebra, initial encounter for closed fracture: Secondary | ICD-10-CM | POA: Diagnosis not present

## 2020-03-16 DIAGNOSIS — I629 Nontraumatic intracranial hemorrhage, unspecified: Secondary | ICD-10-CM | POA: Diagnosis not present

## 2020-03-16 DIAGNOSIS — S01412A Laceration without foreign body of left cheek and temporomandibular area, initial encounter: Secondary | ICD-10-CM | POA: Diagnosis not present

## 2020-03-16 DIAGNOSIS — I619 Nontraumatic intracerebral hemorrhage, unspecified: Secondary | ICD-10-CM | POA: Diagnosis not present

## 2020-03-16 DIAGNOSIS — I2510 Atherosclerotic heart disease of native coronary artery without angina pectoris: Secondary | ICD-10-CM | POA: Diagnosis not present

## 2020-03-16 DIAGNOSIS — R2681 Unsteadiness on feet: Secondary | ICD-10-CM | POA: Diagnosis not present

## 2020-03-16 DIAGNOSIS — Z951 Presence of aortocoronary bypass graft: Secondary | ICD-10-CM | POA: Diagnosis not present

## 2020-03-16 DIAGNOSIS — S32501A Unspecified fracture of right pubis, initial encounter for closed fracture: Secondary | ICD-10-CM | POA: Diagnosis not present

## 2020-03-16 DIAGNOSIS — S8992XA Unspecified injury of left lower leg, initial encounter: Secondary | ICD-10-CM | POA: Diagnosis not present

## 2020-03-16 DIAGNOSIS — R0789 Other chest pain: Secondary | ICD-10-CM | POA: Diagnosis not present

## 2020-03-16 DIAGNOSIS — C801 Malignant (primary) neoplasm, unspecified: Secondary | ICD-10-CM | POA: Diagnosis not present

## 2020-03-16 DIAGNOSIS — F329 Major depressive disorder, single episode, unspecified: Secondary | ICD-10-CM | POA: Diagnosis not present

## 2020-03-16 DIAGNOSIS — S52602A Unspecified fracture of lower end of left ulna, initial encounter for closed fracture: Secondary | ICD-10-CM | POA: Insufficient documentation

## 2020-03-16 DIAGNOSIS — J3489 Other specified disorders of nose and nasal sinuses: Secondary | ICD-10-CM | POA: Diagnosis not present

## 2020-03-16 DIAGNOSIS — S199XXA Unspecified injury of neck, initial encounter: Secondary | ICD-10-CM | POA: Diagnosis not present

## 2020-03-16 DIAGNOSIS — S52592A Other fractures of lower end of left radius, initial encounter for closed fracture: Secondary | ICD-10-CM | POA: Diagnosis not present

## 2020-03-16 DIAGNOSIS — D638 Anemia in other chronic diseases classified elsewhere: Secondary | ICD-10-CM | POA: Diagnosis not present

## 2020-03-16 DIAGNOSIS — M47812 Spondylosis without myelopathy or radiculopathy, cervical region: Secondary | ICD-10-CM | POA: Diagnosis not present

## 2020-03-16 DIAGNOSIS — J069 Acute upper respiratory infection, unspecified: Secondary | ICD-10-CM | POA: Diagnosis not present

## 2020-03-16 DIAGNOSIS — S299XXA Unspecified injury of thorax, initial encounter: Secondary | ICD-10-CM | POA: Diagnosis not present

## 2020-03-16 DIAGNOSIS — Z7982 Long term (current) use of aspirin: Secondary | ICD-10-CM | POA: Diagnosis not present

## 2020-03-16 DIAGNOSIS — I509 Heart failure, unspecified: Secondary | ICD-10-CM | POA: Diagnosis not present

## 2020-03-16 DIAGNOSIS — R0902 Hypoxemia: Secondary | ICD-10-CM | POA: Diagnosis not present

## 2020-03-16 DIAGNOSIS — R0689 Other abnormalities of breathing: Secondary | ICD-10-CM | POA: Diagnosis not present

## 2020-03-16 DIAGNOSIS — W19XXXA Unspecified fall, initial encounter: Secondary | ICD-10-CM | POA: Diagnosis not present

## 2020-03-16 DIAGNOSIS — Z515 Encounter for palliative care: Secondary | ICD-10-CM | POA: Diagnosis not present

## 2020-03-16 DIAGNOSIS — G309 Alzheimer's disease, unspecified: Secondary | ICD-10-CM | POA: Diagnosis not present

## 2020-03-16 DIAGNOSIS — I959 Hypotension, unspecified: Secondary | ICD-10-CM | POA: Diagnosis not present

## 2020-03-16 DIAGNOSIS — S32592A Other specified fracture of left pubis, initial encounter for closed fracture: Secondary | ICD-10-CM | POA: Insufficient documentation

## 2020-03-16 DIAGNOSIS — I255 Ischemic cardiomyopathy: Secondary | ICD-10-CM | POA: Diagnosis not present

## 2020-03-16 DIAGNOSIS — M79605 Pain in left leg: Secondary | ICD-10-CM | POA: Diagnosis not present

## 2020-03-16 DIAGNOSIS — I130 Hypertensive heart and chronic kidney disease with heart failure and stage 1 through stage 4 chronic kidney disease, or unspecified chronic kidney disease: Secondary | ICD-10-CM | POA: Diagnosis not present

## 2020-03-16 DIAGNOSIS — S0993XA Unspecified injury of face, initial encounter: Secondary | ICD-10-CM | POA: Diagnosis not present

## 2020-03-16 DIAGNOSIS — Z20822 Contact with and (suspected) exposure to covid-19: Secondary | ICD-10-CM | POA: Diagnosis not present

## 2020-03-16 DIAGNOSIS — Z7952 Long term (current) use of systemic steroids: Secondary | ICD-10-CM | POA: Diagnosis not present

## 2020-03-16 DIAGNOSIS — Z885 Allergy status to narcotic agent status: Secondary | ICD-10-CM | POA: Diagnosis not present

## 2020-03-16 DIAGNOSIS — Z85528 Personal history of other malignant neoplasm of kidney: Secondary | ICD-10-CM | POA: Diagnosis not present

## 2020-03-16 DIAGNOSIS — E7800 Pure hypercholesterolemia, unspecified: Secondary | ICD-10-CM | POA: Diagnosis not present

## 2020-03-16 DIAGNOSIS — R52 Pain, unspecified: Secondary | ICD-10-CM | POA: Diagnosis not present

## 2020-03-16 DIAGNOSIS — R000 Tachycardia, unspecified: Secondary | ICD-10-CM | POA: Diagnosis not present

## 2020-03-16 DIAGNOSIS — S32592D Other specified fracture of left pubis, subsequent encounter for fracture with routine healing: Secondary | ICD-10-CM | POA: Diagnosis not present

## 2020-03-16 DIAGNOSIS — S06300A Unspecified focal traumatic brain injury without loss of consciousness, initial encounter: Secondary | ICD-10-CM | POA: Diagnosis not present

## 2020-03-16 DIAGNOSIS — E785 Hyperlipidemia, unspecified: Secondary | ICD-10-CM | POA: Diagnosis not present

## 2020-03-16 DIAGNOSIS — I10 Essential (primary) hypertension: Secondary | ICD-10-CM | POA: Diagnosis not present

## 2020-03-16 DIAGNOSIS — S22058A Other fracture of T5-T6 vertebra, initial encounter for closed fracture: Secondary | ICD-10-CM | POA: Diagnosis not present

## 2020-03-16 DIAGNOSIS — I616 Nontraumatic intracerebral hemorrhage, multiple localized: Secondary | ICD-10-CM | POA: Diagnosis not present

## 2020-03-16 DIAGNOSIS — S06340A Traumatic hemorrhage of right cerebrum without loss of consciousness, initial encounter: Secondary | ICD-10-CM | POA: Diagnosis not present

## 2020-03-16 DIAGNOSIS — J449 Chronic obstructive pulmonary disease, unspecified: Secondary | ICD-10-CM | POA: Diagnosis not present

## 2020-03-16 DIAGNOSIS — M19012 Primary osteoarthritis, left shoulder: Secondary | ICD-10-CM | POA: Diagnosis not present

## 2020-03-16 DIAGNOSIS — Z9181 History of falling: Secondary | ICD-10-CM | POA: Diagnosis not present

## 2020-03-16 DIAGNOSIS — S06360A Traumatic hemorrhage of cerebrum, unspecified, without loss of consciousness, initial encounter: Secondary | ICD-10-CM | POA: Diagnosis not present

## 2020-03-16 DIAGNOSIS — E559 Vitamin D deficiency, unspecified: Secondary | ICD-10-CM | POA: Diagnosis not present

## 2020-03-16 DIAGNOSIS — S3993XA Unspecified injury of pelvis, initial encounter: Secondary | ICD-10-CM | POA: Diagnosis not present

## 2020-03-16 DIAGNOSIS — K219 Gastro-esophageal reflux disease without esophagitis: Secondary | ICD-10-CM | POA: Diagnosis not present

## 2020-03-16 DIAGNOSIS — M84459A Pathological fracture, hip, unspecified, initial encounter for fracture: Secondary | ICD-10-CM | POA: Diagnosis not present

## 2020-03-16 DIAGNOSIS — I517 Cardiomegaly: Secondary | ICD-10-CM | POA: Diagnosis not present

## 2020-03-16 DIAGNOSIS — S4992XA Unspecified injury of left shoulder and upper arm, initial encounter: Secondary | ICD-10-CM | POA: Diagnosis not present

## 2020-03-16 DIAGNOSIS — G44309 Post-traumatic headache, unspecified, not intractable: Secondary | ICD-10-CM | POA: Diagnosis not present

## 2020-03-16 DIAGNOSIS — S32502A Unspecified fracture of left pubis, initial encounter for closed fracture: Secondary | ICD-10-CM | POA: Diagnosis not present

## 2020-03-16 DIAGNOSIS — M542 Cervicalgia: Secondary | ICD-10-CM | POA: Diagnosis not present

## 2020-03-16 DIAGNOSIS — I491 Atrial premature depolarization: Secondary | ICD-10-CM | POA: Diagnosis not present

## 2020-03-16 DIAGNOSIS — J323 Chronic sphenoidal sinusitis: Secondary | ICD-10-CM | POA: Diagnosis not present

## 2020-03-16 DIAGNOSIS — R930 Abnormal findings on diagnostic imaging of skull and head, not elsewhere classified: Secondary | ICD-10-CM | POA: Diagnosis not present

## 2020-03-16 DIAGNOSIS — R2689 Other abnormalities of gait and mobility: Secondary | ICD-10-CM | POA: Diagnosis not present

## 2020-03-16 DIAGNOSIS — G9389 Other specified disorders of brain: Secondary | ICD-10-CM | POA: Diagnosis not present

## 2020-03-16 DIAGNOSIS — M7989 Other specified soft tissue disorders: Secondary | ICD-10-CM | POA: Diagnosis not present

## 2020-03-16 DIAGNOSIS — N1832 Chronic kidney disease, stage 3b: Secondary | ICD-10-CM | POA: Diagnosis not present

## 2020-03-16 DIAGNOSIS — M25512 Pain in left shoulder: Secondary | ICD-10-CM | POA: Diagnosis not present

## 2020-03-16 DIAGNOSIS — R41841 Cognitive communication deficit: Secondary | ICD-10-CM | POA: Diagnosis not present

## 2020-03-16 DIAGNOSIS — N189 Chronic kidney disease, unspecified: Secondary | ICD-10-CM | POA: Diagnosis not present

## 2020-03-16 DIAGNOSIS — I129 Hypertensive chronic kidney disease with stage 1 through stage 4 chronic kidney disease, or unspecified chronic kidney disease: Secondary | ICD-10-CM | POA: Diagnosis not present

## 2020-03-16 DIAGNOSIS — Z3202 Encounter for pregnancy test, result negative: Secondary | ICD-10-CM | POA: Diagnosis not present

## 2020-03-16 DIAGNOSIS — N184 Chronic kidney disease, stage 4 (severe): Secondary | ICD-10-CM | POA: Diagnosis not present

## 2020-03-16 DIAGNOSIS — S069X9A Unspecified intracranial injury with loss of consciousness of unspecified duration, initial encounter: Secondary | ICD-10-CM | POA: Diagnosis not present

## 2020-03-16 DIAGNOSIS — W1839XA Other fall on same level, initial encounter: Secondary | ICD-10-CM | POA: Diagnosis not present

## 2020-03-16 DIAGNOSIS — R0602 Shortness of breath: Secondary | ICD-10-CM | POA: Diagnosis not present

## 2020-03-16 DIAGNOSIS — S06320A Contusion and laceration of left cerebrum without loss of consciousness, initial encounter: Secondary | ICD-10-CM | POA: Diagnosis not present

## 2020-03-16 DIAGNOSIS — R918 Other nonspecific abnormal finding of lung field: Secondary | ICD-10-CM | POA: Diagnosis not present

## 2020-03-16 DIAGNOSIS — S0083XA Contusion of other part of head, initial encounter: Secondary | ICD-10-CM | POA: Diagnosis not present

## 2020-03-16 DIAGNOSIS — C649 Malignant neoplasm of unspecified kidney, except renal pelvis: Secondary | ICD-10-CM | POA: Diagnosis not present

## 2020-03-16 DIAGNOSIS — Z9981 Dependence on supplemental oxygen: Secondary | ICD-10-CM | POA: Diagnosis not present

## 2020-03-16 DIAGNOSIS — S2231XA Fracture of one rib, right side, initial encounter for closed fracture: Secondary | ICD-10-CM | POA: Diagnosis not present

## 2020-03-16 DIAGNOSIS — I69828 Other speech and language deficits following other cerebrovascular disease: Secondary | ICD-10-CM | POA: Diagnosis not present

## 2020-03-20 LAB — BASIC METABOLIC PANEL
BUN: 26 — AB (ref 4–21)
CO2: 24 — AB (ref 13–22)
Chloride: 104 (ref 99–108)
Creatinine: 1.2 — AB (ref 0.5–1.1)
Glucose: 156
Potassium: 4.5 (ref 3.4–5.3)
Sodium: 138 (ref 137–147)

## 2020-03-20 LAB — CBC AND DIFFERENTIAL
HCT: 30 — AB (ref 36–46)
Hemoglobin: 9.4 — AB (ref 12.0–16.0)
Platelets: 337 (ref 150–399)
WBC: 7.7

## 2020-03-20 LAB — COMPREHENSIVE METABOLIC PANEL: GFR calc non Af Amer: 41

## 2020-03-21 DIAGNOSIS — R41841 Cognitive communication deficit: Secondary | ICD-10-CM | POA: Diagnosis not present

## 2020-03-21 DIAGNOSIS — J449 Chronic obstructive pulmonary disease, unspecified: Secondary | ICD-10-CM | POA: Diagnosis not present

## 2020-03-21 DIAGNOSIS — Z9181 History of falling: Secondary | ICD-10-CM | POA: Diagnosis not present

## 2020-03-21 DIAGNOSIS — I2510 Atherosclerotic heart disease of native coronary artery without angina pectoris: Secondary | ICD-10-CM | POA: Diagnosis not present

## 2020-03-21 DIAGNOSIS — S52602D Unspecified fracture of lower end of left ulna, subsequent encounter for closed fracture with routine healing: Secondary | ICD-10-CM | POA: Diagnosis not present

## 2020-03-21 DIAGNOSIS — K219 Gastro-esophageal reflux disease without esophagitis: Secondary | ICD-10-CM | POA: Diagnosis not present

## 2020-03-21 DIAGNOSIS — W19XXXA Unspecified fall, initial encounter: Secondary | ICD-10-CM | POA: Diagnosis not present

## 2020-03-21 DIAGNOSIS — Z7189 Other specified counseling: Secondary | ICD-10-CM | POA: Diagnosis not present

## 2020-03-21 DIAGNOSIS — I629 Nontraumatic intracranial hemorrhage, unspecified: Secondary | ICD-10-CM | POA: Diagnosis not present

## 2020-03-21 DIAGNOSIS — R2681 Unsteadiness on feet: Secondary | ICD-10-CM | POA: Diagnosis not present

## 2020-03-21 DIAGNOSIS — S22050S Wedge compression fracture of T5-T6 vertebra, sequela: Secondary | ICD-10-CM | POA: Diagnosis not present

## 2020-03-21 DIAGNOSIS — I129 Hypertensive chronic kidney disease with stage 1 through stage 4 chronic kidney disease, or unspecified chronic kidney disease: Secondary | ICD-10-CM | POA: Diagnosis not present

## 2020-03-21 DIAGNOSIS — S52602A Unspecified fracture of lower end of left ulna, initial encounter for closed fracture: Secondary | ICD-10-CM | POA: Diagnosis not present

## 2020-03-21 DIAGNOSIS — S52502D Unspecified fracture of the lower end of left radius, subsequent encounter for closed fracture with routine healing: Secondary | ICD-10-CM | POA: Diagnosis not present

## 2020-03-21 DIAGNOSIS — S52502A Unspecified fracture of the lower end of left radius, initial encounter for closed fracture: Secondary | ICD-10-CM | POA: Diagnosis not present

## 2020-03-21 DIAGNOSIS — I69828 Other speech and language deficits following other cerebrovascular disease: Secondary | ICD-10-CM | POA: Diagnosis not present

## 2020-03-21 DIAGNOSIS — R634 Abnormal weight loss: Secondary | ICD-10-CM | POA: Diagnosis not present

## 2020-03-21 DIAGNOSIS — M84459A Pathological fracture, hip, unspecified, initial encounter for fracture: Secondary | ICD-10-CM | POA: Diagnosis not present

## 2020-03-21 DIAGNOSIS — G301 Alzheimer's disease with late onset: Secondary | ICD-10-CM | POA: Diagnosis not present

## 2020-03-21 DIAGNOSIS — I509 Heart failure, unspecified: Secondary | ICD-10-CM | POA: Diagnosis not present

## 2020-03-21 DIAGNOSIS — Z951 Presence of aortocoronary bypass graft: Secondary | ICD-10-CM | POA: Diagnosis not present

## 2020-03-21 DIAGNOSIS — I10 Essential (primary) hypertension: Secondary | ICD-10-CM | POA: Diagnosis not present

## 2020-03-21 DIAGNOSIS — M6281 Muscle weakness (generalized): Secondary | ICD-10-CM | POA: Diagnosis not present

## 2020-03-21 DIAGNOSIS — J069 Acute upper respiratory infection, unspecified: Secondary | ICD-10-CM | POA: Diagnosis not present

## 2020-03-21 DIAGNOSIS — F0280 Dementia in other diseases classified elsewhere without behavioral disturbance: Secondary | ICD-10-CM | POA: Diagnosis not present

## 2020-03-21 DIAGNOSIS — Z515 Encounter for palliative care: Secondary | ICD-10-CM | POA: Diagnosis not present

## 2020-03-21 DIAGNOSIS — N2889 Other specified disorders of kidney and ureter: Secondary | ICD-10-CM | POA: Diagnosis not present

## 2020-03-21 DIAGNOSIS — S32592D Other specified fracture of left pubis, subsequent encounter for fracture with routine healing: Secondary | ICD-10-CM | POA: Diagnosis not present

## 2020-03-21 DIAGNOSIS — R2689 Other abnormalities of gait and mobility: Secondary | ICD-10-CM | POA: Diagnosis not present

## 2020-03-21 DIAGNOSIS — E785 Hyperlipidemia, unspecified: Secondary | ICD-10-CM | POA: Diagnosis not present

## 2020-03-21 DIAGNOSIS — C7800 Secondary malignant neoplasm of unspecified lung: Secondary | ICD-10-CM | POA: Diagnosis not present

## 2020-03-21 DIAGNOSIS — I619 Nontraumatic intracerebral hemorrhage, unspecified: Secondary | ICD-10-CM | POA: Diagnosis not present

## 2020-03-21 DIAGNOSIS — D649 Anemia, unspecified: Secondary | ICD-10-CM | POA: Diagnosis not present

## 2020-03-21 DIAGNOSIS — S2231XA Fracture of one rib, right side, initial encounter for closed fracture: Secondary | ICD-10-CM | POA: Diagnosis not present

## 2020-03-21 DIAGNOSIS — N189 Chronic kidney disease, unspecified: Secondary | ICD-10-CM | POA: Diagnosis not present

## 2020-03-21 DIAGNOSIS — C7931 Secondary malignant neoplasm of brain: Secondary | ICD-10-CM | POA: Diagnosis not present

## 2020-03-21 DIAGNOSIS — C801 Malignant (primary) neoplasm, unspecified: Secondary | ICD-10-CM | POA: Diagnosis not present

## 2020-03-25 ENCOUNTER — Encounter: Payer: Self-pay | Admitting: Internal Medicine

## 2020-03-25 ENCOUNTER — Non-Acute Institutional Stay (SKILLED_NURSING_FACILITY): Payer: PPO | Admitting: Internal Medicine

## 2020-03-25 DIAGNOSIS — S2231XA Fracture of one rib, right side, initial encounter for closed fracture: Secondary | ICD-10-CM | POA: Diagnosis not present

## 2020-03-25 DIAGNOSIS — Z7189 Other specified counseling: Secondary | ICD-10-CM

## 2020-03-25 DIAGNOSIS — N2889 Other specified disorders of kidney and ureter: Secondary | ICD-10-CM

## 2020-03-25 DIAGNOSIS — S22050S Wedge compression fracture of T5-T6 vertebra, sequela: Secondary | ICD-10-CM | POA: Diagnosis not present

## 2020-03-25 DIAGNOSIS — C7931 Secondary malignant neoplasm of brain: Secondary | ICD-10-CM | POA: Diagnosis not present

## 2020-03-25 DIAGNOSIS — S52602A Unspecified fracture of lower end of left ulna, initial encounter for closed fracture: Secondary | ICD-10-CM

## 2020-03-25 DIAGNOSIS — G301 Alzheimer's disease with late onset: Secondary | ICD-10-CM

## 2020-03-25 DIAGNOSIS — I619 Nontraumatic intracerebral hemorrhage, unspecified: Secondary | ICD-10-CM | POA: Diagnosis not present

## 2020-03-25 DIAGNOSIS — F0280 Dementia in other diseases classified elsewhere without behavioral disturbance: Secondary | ICD-10-CM

## 2020-03-25 DIAGNOSIS — C7800 Secondary malignant neoplasm of unspecified lung: Secondary | ICD-10-CM

## 2020-03-25 DIAGNOSIS — S52502A Unspecified fracture of the lower end of left radius, initial encounter for closed fracture: Secondary | ICD-10-CM | POA: Diagnosis not present

## 2020-03-25 DIAGNOSIS — J449 Chronic obstructive pulmonary disease, unspecified: Secondary | ICD-10-CM

## 2020-03-25 DIAGNOSIS — C801 Malignant (primary) neoplasm, unspecified: Secondary | ICD-10-CM

## 2020-03-25 NOTE — Progress Notes (Signed)
Provider:  Rexene Edison. Mariea Clonts, D.O., C.M.D. Location:  [facility] Room Number: [place] of Service:  SNF [phone])  PCP: Monico Blitz, MD Patient Care Team: Monico Blitz, MD as PCP - General (Internal Medicine) Carlena Bjornstad, MD as Consulting Physician (Cardiology) Raynelle Bring, MD as Consulting Physician (Urology)  Extended Emergency Contact Information Primary Emergency Contact: Fenton Malling [place] Phone: [phone] Relation: Son Secondary Emergency Contact: Laren Boom [place] of [place] Phone: [phone] Relation: Daughter  Code Status: DNR Goals of Care: Advanced Directive information Advanced Directives 03/25/2020  Does Patient Have a Medical Advance Directive? Yes  Type of Advance Directive Out of facility DNR (pink MOST or yellow form)  Does patient want to make changes to medical advance directive? No - Patient declined  Copy of [place] in Chart? -  Would patient like information on creating a medical advance directive? -  Pre-existing out of facility DNR order (yellow form or pink MOST form) Pink MOST/Yellow Form most recent copy in chart - Physician notified to receive inpatient order    Chief Complaint  Patient presents with  . New Admit To SNF    New Admit to SNF    HPI: Patient is a 85 y.o. female seen today for admission to [facility] status post hospitalization August 1 to August 6 at [facility].  She has a history of Alzheimer's disease, congestive heart failure, chronic kidney disease, hypertension, hyperlipidemia, renal cell carcinoma, COPD and coronary artery disease.  She had presented to [facility] status post fall at home when walking to the bathroom.  She was evaluated at an outside hospital prior to transfer to [facility].  She was found to have the small intraparenchymal hematoma and left distal radius fracture which was splinted at the outside  hospital.  She was then transported there to [place] for continued care and management.  She complained of left arm and left leg pain.  In the [facility] ED she was found to have patchy left hilar opacities which could be aspiration versus contusion left humeral head was projecting over the left acromion suggestive of dislocation.  She also had an acute minimally displaced fracture of her left pubic body.  Osteopenia was noted.  She had an age-indeterminate nondisplaced anterior right sixth rib fracture.  She had an age-indeterminate compression deformity at T6 favored to be remote.  Multilevel degenerative changes of the thoracolumbar spine and levoscoliosis of the lumbar spine L3-4 calcified a left radius fracture.  She was admitted to the trauma floor Dr. Naida Sleight.  She was placed in spine precautions with elevation of the bed less than 30, c-collar and upright when able.  Intraparenchymal hemorrhage she was treated with Keppra every 4 hours neurochecks and CT the head was repeated later in the day.  Ortho was consulted regarding her radial fracture.  Had been reduced in the emergency department.  Unfortunately during her extensive trauma imaging she was found to have diffuse metastatic disease.  Imaging findings included multiple enhancing supratentorial lesions of the brain, numerous bilateral solid pulmonary masses and nodules, numerous bilateral cystic renal lesions too small to characterize and a high density lesion in the interpolar left kidney with associated thin internal septations, complex heterogeneous hepatic lesion noted previously on an MRI in 2017 to be a cavernous hemangioma.  Patient did not wish to pursue biopsy or treatment if her lesions are in fact cancer.  She had  her moderna Covid vaccines January 23 and February 22. Past Medical History:  Diagnosis Date  . Carotid artery disease ([place])    Doppler, January, 2010, mild plaque, no major stenoses  . CKD (chronic kidney disease)     Creatinine 1.4, August, 2012  . Congenital prolapsed rectum   . COPD (chronic obstructive pulmonary disease) ([place])    Home O2, Severe pneumonia 2008  . COPD (chronic obstructive pulmonary disease) ([place])   . Coronary artery disease    Catheterization November, 2009, 2 of 4 grafts patent,  . Coronary artery disease   . 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 Drug therapy    Prednisone intermittently for anemia  . Ejection fraction    EF normal, cath, 2009 /  EF 55-60%, echo, July, 2012, mild septal bounce  . GERD (gastroesophageal reflux disease)   . Gout   . Hemolytic anemia (HCC)    Darovsky, Probable Coombs negative hemolytic anemia, prednisone therapy intermittent  . History of prolonged Q-T interval on ECG    Intermittent  . Hx of CABG    2002  . Mitral regurgitation    Mild  . Osteoporosis   . PAD (peripheral artery disease) (HCC)    Mild, legs  . PVC's (premature ventricular contractions)   . Renal carcinoma ([place])    Treated by freezing by urology, 2012  . Shoulder pain    Rotator cuff  . Syncope    January, 2010, probable orthostatic and volume  /  he vent recorder may, 2011, normal sinus rhythm   Past Surgical History:  Procedure Laterality Date  . ABDOMINAL HYSTERECTOMY    . CARDIAC CATHETERIZATION  2009   2/4 occluded grafts most concerning area was the ostieal stenosis of the LM . Both bypass grafts to the anterioro cirulation are occluded. The mida and distal lLAD gets some filling from the RCA.Stenosis of the LM could potentially sompromise flow to the promixmal and midportion of in the LAD. Intra coronary [place] could be considered should she continue to be symptomatic.  Tx medically for now.  Marland Kitchen COPD    . CORONARY ARTERY BYPASS GRAFT    . coronary artery disease     . GALLBLADDER SURGERY    . KIDNEY SURGERY    . RECTAL BIOPSY    . SHOULDER SURGERY      Social History   Socioeconomic History  . Marital status: Married    Spouse name: Not  on file  . Number of children: Not on file  . Years of education: Not on file  . Highest education level: Not on file  Occupational History  . Not on file  Tobacco Use  . Smoking status: Former Smoker    Packs/day: 2.00    Years: 40.00    Pack years: 80.00    Types: Cigarettes    Start date: 08/27/1949    Quit date: 08/16/1997    Years since quitting: 22.6  . Smokeless tobacco: Never Used  Vaping Use  . Vaping Use: Never used  Substance and Sexual Activity  . Alcohol use: Never  . Drug use: No  . Sexual activity: Not Currently  Other Topics Concern  . Not on file  Social History Narrative  . Not on file   Social Determinants of Health   Financial Resource Strain:   . Difficulty of Paying Living Expenses:   Food Insecurity:   . Worried About Charity fundraiser in the Last  Year:   . Ran Out of Food in the Last Year:   Transportation Needs:   . Film/video editor (Medical):   Marland Kitchen Lack of Transportation (Non-Medical):   Physical Activity:   . Days of Exercise per Week:   . Minutes of Exercise per Session:   Stress:   . Feeling of Stress :   Social Connections:   . Frequency of Communication with Friends and Family:   . Frequency of Social Gatherings with Friends and Family:   . Attends Religious Services:   . Active Member of Clubs or Organizations:   . Attends Archivist Meetings:   Marland Kitchen Marital Status:     reports that she quit smoking about 22 years ago. Her smoking use included cigarettes. She started smoking about 68 years ago. She has a 80.00 pack-year smoking history. She has never used smokeless tobacco. She reports that she does not drink alcohol and does not use drugs.  Functional Status Survey:    Family History  Problem Relation Age of Onset  . Stroke Father   . Heart attack Brother   . Heart attack Mother     Health Maintenance  Topic Date Due  . TETANUS/TDAP  Never done  . DEXA SCAN  Never done  . PNA vac Low Risk Adult (1 of 2 -  PCV13) Never done  . INFLUENZA VACCINE  03/16/2020  . COVID-19 Vaccine  Completed    Allergies  Allergen Reactions  . Codeine     REACTION: hallucinate    Outpatient Encounter Medications as of 03/25/2020  Medication Sig  . acetaminophen (TYLENOL) 325 MG tablet Take 650 mg by mouth every 6 (six) hours as needed for mild pain or moderate pain.   Marland Kitchen allopurinol (ZYLOPRIM) 300 MG tablet Take 300 mg by mouth daily.  Marland Kitchen aspirin EC 81 MG tablet Take 81 mg by mouth daily.  . celecoxib (CELEBREX) 200 MG capsule Take 1 capsule by mouth daily.  . colchicine 0.6 MG tablet Take 0.6 mg by mouth daily.  Marland Kitchen donepezil (ARICEPT) 10 MG tablet Take 10 mg by mouth daily.  Marland Kitchen gabapentin (NEURONTIN) 100 MG capsule Take 100 mg by mouth 3 (three) times daily.  Marland Kitchen levETIRAcetam (KEPPRA) 500 MG tablet Take 500 mg by mouth 2 (two) times daily.  Marland Kitchen LORazepam (ATIVAN) 0.5 MG tablet Take 0.5 mg by mouth 2 (two) times daily as needed for anxiety.  . metoprolol succinate (TOPROL-XL) 25 MG 24 hr tablet Take 25 mg by mouth every morning.   . nitroGLYCERIN (NITROSTAT) 0.4 MG SL tablet Place 1 tablet (0.4 mg total) under the tongue every 5 (five) minutes as needed.  . OXYGEN Inhale 2 L into the lungs at bedtime.  . predniSONE (DELTASONE) 5 MG tablet Take 1 tablet by mouth daily.  . sertraline (ZOLOFT) 50 MG tablet Take 50 mg by mouth every morning.   . [DISCONTINUED] allopurinol (ZYLOPRIM) 100 MG tablet Take 100 mg by mouth every morning.   . [DISCONTINUED] ondansetron (ZOFRAN ODT) 4 MG disintegrating tablet 4mg  ODT q4 hours prn nausea/vomit  . [DISCONTINUED] pantoprazole (PROTONIX) 40 MG tablet Take 40 mg by mouth daily.  . [DISCONTINUED] Probiotic Product (TRUBIOTICS) CAPS Take 1 capsule by mouth daily.   No facility-administered encounter medications on file as of 03/25/2020.    Review of Systems  Constitutional: Positive for malaise/fatigue and weight loss. Negative for chills and fever.       Reports losing 5-10 lbs  over the past few years  HENT: Negative for congestion and sore throat.   Eyes: Negative for blurred vision.  Respiratory: Negative for cough and shortness of breath.        COPD, CHF  Cardiovascular: Negative for chest pain, palpitations and leg swelling.  Gastrointestinal: Negative for abdominal pain, blood in stool, constipation, diarrhea and melena.  Genitourinary: Negative for dysuria.  Musculoskeletal: Positive for falls.       Denying pain at rest  Skin: Negative for itching and rash.  Neurological: Negative for dizziness and loss of consciousness.  Endo/Heme/Allergies: Bruises/bleeds easily.  Psychiatric/Behavioral: Positive for memory loss. Negative for depression. The patient is not nervous/anxious.     Vitals:   03/25/20 0922  BP: 124/65  Pulse: 77  Temp: 98 F (36.7 C)  SpO2: 98%  Weight: 107 lb (48.5 kg)  Height: 5\' 3"  (1.6 m)   Body mass index is 18.95 kg/m. Physical Exam Vitals reviewed.  Constitutional:      General: She is not in acute distress.    Appearance: Normal appearance. She is not toxic-appearing.     Comments: Thin female  HENT:     Head: Normocephalic.     Right Ear: External ear normal.     Left Ear: External ear normal.     Nose: Nose normal.     Mouth/Throat:     Pharynx: Oropharynx is clear.  Eyes:     Extraocular Movements: Extraocular movements intact.     Conjunctiva/sclera: Conjunctivae normal.     Pupils: Pupils are equal, round, and reactive to light.  Cardiovascular:     Rate and Rhythm: Normal rate and regular rhythm.     Heart sounds: Murmur heard.   Pulmonary:     Effort: Pulmonary effort is normal.     Breath sounds: Wheezing present.  Abdominal:     General: Bowel sounds are normal.     Palpations: Abdomen is soft.     Tenderness: There is no abdominal tenderness. There is no guarding or rebound.  Musculoskeletal:        General: Normal range of motion.     Cervical back: Neck supple.     Right lower leg: No edema.      Left lower leg: No edema.     Comments: Left arm in sling, left groin tenderness  Skin:    General: Skin is warm and dry.     Findings: Bruising present.  Neurological:     General: No focal deficit present.     Mental Status: She is alert.     Motor: Weakness present.     Gait: Gait abnormal.     Comments: Oriented to person and place, pleasant; seated in wheelchair  Psychiatric:        Mood and Affect: Mood normal.        Behavior: Behavior normal.    Labs reviewed: Basic Metabolic Panel: Recent Labs    03/20/20 0000  NA 138  K 4.5  CL 104  CO2 24*  BUN 26*  CREATININE 1.2*   Liver Function Tests: No results for input(s): AST, ALT, ALKPHOS, BILITOT, PROT, ALBUMIN in the last 8760 hours. No results for input(s): LIPASE, AMYLASE in the last 8760 hours. No results for input(s): AMMONIA in the last 8760 hours. CBC: Recent Labs    03/20/20 0000  WBC 7.7  HGB 9.4*  HCT 30*  PLT 337   Cardiac Enzymes: No results for input(s): CKTOTAL, CKMB, CKMBINDEX, TROPONINI in the last 8760 hours. BNP: Invalid input(s): POCBNP  Lab Results  Component Value Date   HGBA1C  06/29/2008    5.2 (NOTE)   The ADA recommends the following therapeutic goal for glycemic   control related to Hgb A1C measurement:   Goal of Therapy:   < 7.0% Hgb A1C   Reference: American Diabetes Association: Clinical Practice   Recommendations 2008, Diabetes Care,  2008, 31:(Suppl 1).   Lab Results  Component Value Date   TSH 1.31 01/31/2015   No results found for: VITAMINB12 No results found for: FOLATE Lab Results  Component Value Date   IRON 74 06/30/2008   TIBC 281 06/30/2008    Imaging and Procedures obtained prior to SNF admission: DG Chest 2 View  Result Date: 03/23/2018 CLINICAL DATA:  Chest pain. EXAM: CHEST - 2 VIEW COMPARISON:  Chest x-ray dated April 14, 2016. FINDINGS: The heart size and mediastinal contours are within normal limits. Prior CABG. Normal pulmonary vascularity.  Atherosclerotic calcification of the thoracic aorta. The lungs remain hyperinflated with emphysematous changes. No focal consolidation, pleural effusion, or pneumothorax. No acute osseous abnormality. IMPRESSION: COPD.  No active cardiopulmonary disease. Electronically Signed   By: Titus Dubin M.D.   On: 03/23/2018 13:07    Assessment/Plan 1. Intraparenchymal hemorrhage of brain ([place]) -very small and seems it did not have a focal neuro deficit resulting -cont keppra for seizure prophylaxis given this and brain mets  2. Closed fracture of left distal radius and ulna, initial encounter -PFWB LUE with splint in place, PT, OT here F/u with ortho 2 wks  Cont pain mgt with celebrex and tylenol  3. Closed fracture of one rib of right side, initial encounter -continue with IS here, oxygen therapy at 3L via [place] due to baseline COPD and now suspected lung mets, on baseline prednisone for her COPD also  4. Closed wedge compression fracture of T6 vertebra, sequela -cont tylenol, celebrex, felt to be old  5. Metastatic carcinoma involving lung with unknown primary site, unspecified laterality ([place]) -suspected renal cell primary -pt declines biopsy and further investigations, requests comfort care -seems amazingly comfortable now in view of multiple fxs and her metastatic disease  6. 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 Left renal mass -h/o renal cell in the past, has mass now on left kidney and several areas of mets in lungs and brain  8. ACP (advance care planning) -discussed with patient herself for 17 mins about findings and her desires -agrees with DNR status that's on file -also does NOT want investigation of metastatic disease -requests comfort care and seems comfortable now -will ask palliative care to help get MOST done for her and discuss further with her family  9.  Copd -cont home O2 at 3L via [place], cont  prednisone, not on nebs or HFAs  10.  Alzheimer's--on aricept at baseline, family supportive  Family/ staff Communication: discussed with snf nurse  Labs/tests ordered: cbc, bmp, palliative care   Orton Capell L. Filomena Pokorney, D.O. [facility] [address], [place] [address] Cell Phone (Mon-Fri 8am-5pm):  [phone] On Call:  [phone] & follow prompts after 5pm & weekends Office Phone:  [phone] Office Fax:  [phone]

## 2020-03-27 ENCOUNTER — Non-Acute Institutional Stay: Payer: Self-pay

## 2020-03-27 VITALS — BP 136/80 | HR 82 | Temp 96.3°F

## 2020-03-27 DIAGNOSIS — Z515 Encounter for palliative care: Secondary | ICD-10-CM

## 2020-03-27 DIAGNOSIS — D649 Anemia, unspecified: Secondary | ICD-10-CM | POA: Diagnosis not present

## 2020-03-27 DIAGNOSIS — M6281 Muscle weakness (generalized): Secondary | ICD-10-CM | POA: Diagnosis not present

## 2020-03-27 LAB — BASIC METABOLIC PANEL
BUN: 17 (ref 4–21)
CO2: 21 (ref 13–22)
Chloride: 99 (ref 99–108)
Creatinine: 0.8 (ref 0.5–1.1)
Glucose: 83
Potassium: 4.7 (ref 3.4–5.3)
Sodium: 133 — AB (ref 137–147)

## 2020-03-27 LAB — CBC AND DIFFERENTIAL
HCT: 27 — AB (ref 36–46)
Hemoglobin: 8.5 — AB (ref 12.0–16.0)
Platelets: 404 — AB (ref 150–399)
WBC: 6.2

## 2020-03-27 LAB — COMPREHENSIVE METABOLIC PANEL
Calcium: 8.6 — AB (ref 8.7–10.7)
GFR calc Af Amer: 77.77
GFR calc non Af Amer: 67.1

## 2020-03-27 LAB — CBC: RBC: 3.21 — AB (ref 3.87–5.11)

## 2020-03-28 DIAGNOSIS — J069 Acute upper respiratory infection, unspecified: Secondary | ICD-10-CM | POA: Diagnosis not present

## 2020-03-29 DIAGNOSIS — C7800 Secondary malignant neoplasm of unspecified lung: Secondary | ICD-10-CM | POA: Insufficient documentation

## 2020-03-29 DIAGNOSIS — S2231XA Fracture of one rib, right side, initial encounter for closed fracture: Secondary | ICD-10-CM | POA: Insufficient documentation

## 2020-03-29 DIAGNOSIS — C801 Malignant (primary) neoplasm, unspecified: Secondary | ICD-10-CM | POA: Insufficient documentation

## 2020-03-29 DIAGNOSIS — C7931 Secondary malignant neoplasm of brain: Secondary | ICD-10-CM | POA: Insufficient documentation

## 2020-03-29 DIAGNOSIS — S22050A Wedge compression fracture of T5-T6 vertebra, initial encounter for closed fracture: Secondary | ICD-10-CM | POA: Insufficient documentation

## 2020-03-29 DIAGNOSIS — N2889 Other specified disorders of kidney and ureter: Secondary | ICD-10-CM | POA: Insufficient documentation

## 2020-03-29 DIAGNOSIS — I619 Nontraumatic intracerebral hemorrhage, unspecified: Secondary | ICD-10-CM | POA: Insufficient documentation

## 2020-03-31 ENCOUNTER — Non-Acute Institutional Stay: Payer: Medicare HMO | Admitting: Internal Medicine

## 2020-03-31 DIAGNOSIS — C7800 Secondary malignant neoplasm of unspecified lung: Secondary | ICD-10-CM | POA: Diagnosis not present

## 2020-03-31 DIAGNOSIS — D649 Anemia, unspecified: Secondary | ICD-10-CM | POA: Diagnosis not present

## 2020-03-31 DIAGNOSIS — C801 Malignant (primary) neoplasm, unspecified: Secondary | ICD-10-CM | POA: Diagnosis not present

## 2020-03-31 DIAGNOSIS — I10 Essential (primary) hypertension: Secondary | ICD-10-CM | POA: Diagnosis not present

## 2020-03-31 DIAGNOSIS — Z515 Encounter for palliative care: Secondary | ICD-10-CM

## 2020-03-31 LAB — BASIC METABOLIC PANEL
BUN: 15 (ref 4–21)
CO2: 20 (ref 13–22)
Chloride: 98 — AB (ref 99–108)
Creatinine: 0.9 (ref 0.5–1.1)
Glucose: 81
Potassium: 5.4 — AB (ref 3.4–5.3)
Sodium: 131 — AB (ref 137–147)

## 2020-03-31 LAB — CBC AND DIFFERENTIAL
HCT: 28 — AB (ref 36–46)
Hemoglobin: 8.9 — AB (ref 12.0–16.0)
Platelets: 457 — AB (ref 150–399)
WBC: 6.4

## 2020-03-31 LAB — CBC: RBC: 3.39 — AB (ref 3.87–5.11)

## 2020-03-31 LAB — COMPREHENSIVE METABOLIC PANEL
Calcium: 8.6 — AB (ref 8.7–10.7)
GFR calc Af Amer: 71.33
GFR calc non Af Amer: 61.54

## 2020-03-31 NOTE — Progress Notes (Signed)
[facility] Consult Note Telephone: [phone]  Fax: [phone]  PATIENT NAME: [name] DOB: [birth year] MRN: [id number]  PRIMARY CARE PROVIDER:   Monico Blitz, MD  REFERRING PROVIDER:  Monico Blitz, MD [address] [place],  [place] [address]  RESPONSIBLE PARTY:   Self and  Sakura, Denis [phone]      RECOMMENDATIONS and PLAN:  Palliative care encounter  Z51.5   1. Advance care planning:  Explanation of palliative care services.  Reviewed pt's goals which include re-cooperation and moving to a new residence at an assisted living facility.  She does not plan on having any biopsies performed for multiple nodular lung nodules or brain lesions.  Plan on additional discussion with son to review health concerns, advanced directives and future planning.  DNR designation previously determined.  DNR form uploaded to Epic. Palliative care will follow-up with patient.  2.  Weakness:  Multifactorial(fall, brain and pulmonary lesions).  Supportive care as needed. Balanced meals and appropriate hydration.  3.  Shortness of breath:  New pulmonary nodules and emphysema.  Continue supplemental oxygen use.  Respiratory meds as prescribed.  Consider transition to Hospice care when indicated.   I spent 45 minutes providing this consultation,  from 1600 to 1645. More than 50% of the time in this consultation was spent coordinating communication with patient and clinical staff.   HISTORY OF PRESENT ILLNESS:  [name] is a 85 y.o. year old female with multiple medical problems including COPD with dependence on supplemental O2, CAD post CABG, hx of renal cancer.  She was admitted to the hospital after a fall and sustaining fractures of the LUE wrist, pelvis brain hemorrhage and findings of multiple brain lesions and pulmonary nodules which are thought to be cancerous. No plans for biopsies per pt's desires. She requires assistance with ADLs and   ambulation Palliative Care was asked to help address goals of care.   CODE STATUS: DNAR  PPS: 40% weak HOSPICE ELIGIBILITY/DIAGNOSIS: TBD  PAST MEDICAL HISTORY:  Past Medical History:  Diagnosis Date  . Carotid artery disease ([place])    Doppler, January, 2010, mild plaque, no major stenoses  . CKD (chronic kidney disease)    Creatinine 1.4, August, 2012  . Congenital prolapsed rectum   . COPD (chronic obstructive pulmonary disease) ([place])    Home O2, Severe pneumonia 2008  . COPD (chronic obstructive pulmonary disease) ([place])   . Coronary artery disease    Catheterization November, 2009, 2 of 4 grafts patent,  . Coronary artery disease   . 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 Drug therapy    Prednisone intermittently for anemia  . Ejection fraction    EF normal, cath, 2009 /  EF 55-60%, echo, July, 2012, mild septal bounce  . GERD (gastroesophageal reflux disease)   . Gout   . Hemolytic anemia (HCC)    Darovsky, Probable Coombs negative hemolytic anemia, prednisone therapy intermittent  . History of prolonged Q-T interval on ECG    Intermittent  . Hx of CABG    2002  . Mitral regurgitation    Mild  . Osteoporosis   . PAD (peripheral artery disease) (HCC)    Mild, legs  . PVC's (premature ventricular contractions)   . Renal carcinoma ([place])    Treated by freezing by urology, 2012  . Shoulder pain    Rotator cuff  . Syncope    January, 2010, probable orthostatic and  volume  /  he vent recorder may, 2011, normal sinus rhythm     PERTINENT MEDICATIONS:  Outpatient Encounter Medications as of 03/31/2020  Medication Sig  . acetaminophen (TYLENOL) 325 MG tablet Take 650 mg by mouth every 6 (six) hours as needed for mild pain or moderate pain.   Marland Kitchen allopurinol (ZYLOPRIM) 300 MG tablet Take 300 mg by mouth daily.  Marland Kitchen aspirin EC 81 MG tablet Take 81 mg by mouth daily.  . celecoxib (CELEBREX) 200 MG capsule Take 1 capsule by mouth daily.  . colchicine 0.6 MG  tablet Take 0.6 mg by mouth daily.  Marland Kitchen donepezil (ARICEPT) 10 MG tablet Take 10 mg by mouth daily.  Marland Kitchen gabapentin (NEURONTIN) 100 MG capsule Take 100 mg by mouth 3 (three) times daily.  Marland Kitchen levETIRAcetam (KEPPRA) 500 MG tablet Take 500 mg by mouth 2 (two) times daily.  Marland Kitchen LORazepam (ATIVAN) 0.5 MG tablet Take 0.5 mg by mouth 2 (two) times daily as needed for anxiety.  . metoprolol succinate (TOPROL-XL) 25 MG 24 hr tablet Take 25 mg by mouth every morning.   . nitroGLYCERIN (NITROSTAT) 0.4 MG SL tablet Place 1 tablet (0.4 mg total) under the tongue every 5 (five) minutes as needed.  . OXYGEN Inhale 2 L into the lungs at bedtime.  . predniSONE (DELTASONE) 5 MG tablet Take 1 tablet by mouth daily.  . sertraline (ZOLOFT) 50 MG tablet Take 50 mg by mouth every morning.    No facility-administered encounter medications on file as of 03/31/2020.    PHYSICAL EXAM:   General: NAD, frail appearing, thin elderly female sitting in recliner Cardiovascular: regular rate and rhythm Pulmonary: clear ant fields.  O2 via [place] in use Abdomen: soft, nontender, + bowel sounds Extremities: no edema Skin: multiple patches of ecchymosis of anterior chest, RUE and BLE, LUE in cast Neurological: A&O x3, weakness Psych:  Pleasant mood, cooperative  Gonzella Lex, NP-C

## 2020-04-01 ENCOUNTER — Other Ambulatory Visit: Payer: Self-pay

## 2020-04-02 DIAGNOSIS — I10 Essential (primary) hypertension: Secondary | ICD-10-CM | POA: Diagnosis not present

## 2020-04-02 DIAGNOSIS — D649 Anemia, unspecified: Secondary | ICD-10-CM | POA: Diagnosis not present

## 2020-04-02 LAB — BASIC METABOLIC PANEL
BUN: 20 (ref 4–21)
CO2: 20 (ref 13–22)
Chloride: 96 — AB (ref 99–108)
Creatinine: 1 (ref 0.5–1.1)
Glucose: 77
Potassium: 5.1 (ref 3.4–5.3)
Sodium: 128 — AB (ref 137–147)

## 2020-04-02 LAB — COMPREHENSIVE METABOLIC PANEL
Calcium: 9.1 (ref 8.7–10.7)
GFR calc Af Amer: 61.76
GFR calc non Af Amer: 53.29

## 2020-04-02 LAB — CBC: RBC: 3.38 — AB (ref 3.87–5.11)

## 2020-04-02 LAB — CBC AND DIFFERENTIAL
HCT: 28 — AB (ref 36–46)
Hemoglobin: 8.9 — AB (ref 12.0–16.0)
Platelets: 460 — AB (ref 150–399)
WBC: 7.5

## 2020-04-03 ENCOUNTER — Non-Acute Institutional Stay (SKILLED_NURSING_FACILITY): Payer: PPO | Admitting: Family

## 2020-04-03 ENCOUNTER — Encounter: Payer: Self-pay | Admitting: Family

## 2020-04-03 DIAGNOSIS — S52502A Unspecified fracture of the lower end of left radius, initial encounter for closed fracture: Secondary | ICD-10-CM | POA: Diagnosis not present

## 2020-04-03 DIAGNOSIS — R634 Abnormal weight loss: Secondary | ICD-10-CM | POA: Diagnosis not present

## 2020-04-03 DIAGNOSIS — S52602A Unspecified fracture of lower end of left ulna, initial encounter for closed fracture: Secondary | ICD-10-CM

## 2020-04-03 NOTE — Progress Notes (Signed)
PATIENT NAME: [name] DOB: Oct 28, 1934 MRN: [id number]  PRIMARY CARE PROVIDER: Monico Blitz, MD  RESPONSIBLE PARTY:  Acct ID - Guarantor Home Phone Work Phone Relationship Acct Type  [id number] - [name]  Self P/F     [place], [place], [place] [address]    PLAN OF CARE and INTERVENTIONS:               1.  GOALS OF CARE/ ADVANCE CARE PLANNING:  Goals include increasing strength and returning home               2.  PATIENT/CAREGIVER EDUCATION:  Introduced Palliative care, reinforced safety and fall prevention                           3.  DISEASE STATUS: RN visit, met with patient in her room. Patient is awake and aware. Engages in conversation. Patient is at [facility] S/P hospitalization related to a fall with injury of Left wrist. Patient shared that she usually lives with her son in [place] and the plan is to return there.    HISTORY OF PRESENT ILLNESS:  This is a 85 year old female. Palliative care will follow.   CODE STATUS: DNR ADVANCED DIRECTIVES: Y MOST FORM: N PPS: 40%   PHYSICAL EXAM:   VITALS: Today's Vitals   03/27/20 1206  BP: 136/80  Pulse: 82  Temp: (!) 96.3 F (35.7 C)  SpO2: 97%  PainSc: 4   PainLoc: Shoulder    LUNGS: Clear to Auscultation CARDIAC: Regular rate and rhythm EXTREMITIES: No edema noted NEURO: A & O x 3. Positive for weakness       Cornelius Moras, RN

## 2020-04-03 NOTE — Progress Notes (Signed)
Location:    [place].   Nursing Home Room Number: 578-I Place of Service:  SNF (31) Provider:  Marlowe Sax, NP    Patient Care Team: Monico Blitz, MD as PCP - General (Internal Medicine) Carlena Bjornstad, MD as Consulting Physician (Cardiology) Raynelle Bring, MD as Consulting Physician (Urology)  Extended Emergency Contact Information Primary Emergency Contact: Fenton Malling [place] of [place] Phone: [phone] Relation: Son Secondary Emergency Contact: Laren Boom [place] of [place] Phone: [phone] Relation: Daughter  Code Status:  DNR Goals of care: Advanced Directive information Advanced Directives 04/03/2020  Does Patient Have a Medical Advance Directive? Yes  Type of Advance Directive Out of facility DNR (pink MOST or yellow form)  Does patient want to make changes to medical advance directive? No - Patient declined  Copy of [place] in Chart? -  Would patient like information on creating a medical advance directive? -  Pre-existing out of facility DNR order (yellow form or pink MOST form) Pink Most/Yellow Form available - Physician notified to receive inpatient order     Chief Complaint  Patient presents with  . Acute Visit    Weight Loss.    HPI:  Pt is a 85 y.o. female seen today for an acute visit for evaluation of weight loss.Facility staff reports weight loss of 3.4 lbs over 7 days.she eats 50% of all her meals.she is seen up on recliner today.she denies any acute issues during visit.follows up with facility Nutritionist plans to add protein supplement and liberalize diet.she status post hospital admission at [facility] 03/16/2020 - 03/21/2020 for fall at home.she was found to have the small intraparenchymal hematoma and left distal radius fracture which splint was applied applied at another outside hospital prior to admission.during her imaging she was found to have diffuse metastatic disease lesions of  the brain,numerous bilateral solid pulmonary masses,nodules,numerous bilateral cystic renal lesions.she did not wish to pursue biopsy or treatment if her lesions are in fact cancer. She denies any acute issues during visit states cannot wait to have her left arm cast out.   Past Medical History:  Diagnosis Date  . Carotid artery disease ([place])    Doppler, January, 2010, mild plaque, no major stenoses  . CKD (chronic kidney disease)    Creatinine 1.4, August, 2012  . Congenital prolapsed rectum   . COPD (chronic obstructive pulmonary disease) ([place])    Home O2, Severe pneumonia 2008  . COPD (chronic obstructive pulmonary disease) ([place])   . Coronary artery disease    Catheterization November, 2009, 2 of 4 grafts patent,  . Coronary artery disease   . 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 Drug therapy    Prednisone intermittently for anemia  . Ejection fraction    EF normal, cath, 2009 /  EF 55-60%, echo, July, 2012, mild septal bounce  . GERD (gastroesophageal reflux disease)   . Gout   . Hemolytic anemia (HCC)    Darovsky, Probable Coombs negative hemolytic anemia, prednisone therapy intermittent  . History of prolonged Q-T interval on ECG    Intermittent  . Hx of CABG    2002  . Mitral regurgitation    Mild  . Osteoporosis   . PAD (peripheral artery disease) (HCC)    Mild, legs  . PVC's (premature ventricular contractions)   . Renal carcinoma ([place])    Treated by freezing by urology, 2012  . Shoulder pain  Rotator cuff  . Syncope    January, 2010, probable orthostatic and volume  /  he vent recorder may, 2011, normal sinus rhythm   Past Surgical History:  Procedure Laterality Date  . ABDOMINAL HYSTERECTOMY    . CARDIAC CATHETERIZATION  2009   2/4 occluded grafts most concerning area was the ostieal stenosis of the LM . Both bypass grafts to the anterioro cirulation are occluded. The mida and distal lLAD gets some filling from the RCA.Stenosis of the  LM could potentially sompromise flow to the promixmal and midportion of in the LAD. Intra coronary [place] could be considered should she continue to be symptomatic.  Tx medically for now.  Marland Kitchen COPD    . CORONARY ARTERY BYPASS GRAFT    . coronary artery disease     . GALLBLADDER SURGERY    . KIDNEY SURGERY    . RECTAL BIOPSY    . SHOULDER SURGERY      Allergies  Allergen Reactions  . Codeine     REACTION: hallucinate    Allergies as of 04/03/2020      Reactions   Codeine    REACTION: hallucinate      Medication List       Accurate as of April 03, 2020 11:06 AM. If you have any questions, ask your nurse or doctor.        STOP taking these medications   OXYGEN Stopped by: Sandrea Hughs, NP     TAKE these medications   acetaminophen 325 MG tablet Commonly known as: TYLENOL Take 650 mg by mouth every 6 (six) hours as needed for mild pain or moderate pain.   Albuterol Sulfate 2.5 MG/0.5ML Nebu Inhale 2.5 mg into the lungs every 6 (six) hours as needed.   allopurinol 300 MG tablet Commonly known as: ZYLOPRIM Take 300 mg by mouth daily.   amLODipine 5 MG tablet Commonly known as: NORVASC Take 5 mg by mouth daily.   Anoro Ellipta 62.5-25 MCG/INH Aepb Generic drug: umeclidinium-vilanterol Inhale 1 puff into the lungs daily. Rinse mouth with water and spit after usage.   aspirin EC 81 MG tablet Take 81 mg by mouth daily.   celecoxib 200 MG capsule Commonly known as: CELEBREX Take 1 capsule by mouth daily.   colchicine 0.6 MG tablet Take 0.6 mg by mouth 2 (two) times daily as needed.   donepezil 10 MG tablet Commonly known as: ARICEPT Take 10 mg by mouth daily.   gabapentin 100 MG capsule Commonly known as: NEURONTIN Take 100 mg by mouth 3 (three) times daily.   LACTOBACILLUS PO Take 1 tablet by mouth daily.   levETIRAcetam 500 MG tablet Commonly known as: KEPPRA Take 500 mg by mouth 2 (two) times daily.   LORazepam 0.5 MG tablet Commonly known as:  ATIVAN Take 0.5 mg by mouth at bedtime.   melatonin 3 MG Tabs tablet Take 3 mg by mouth at bedtime.   metoprolol succinate 25 MG 24 hr tablet Commonly known as: TOPROL-XL Take 25 mg by mouth every morning.   MULTIVITAMIN & MINERAL PO Take 1 tablet by mouth daily.   NEOSPORIN EX Apply 1 application topically as needed.   nitroGLYCERIN 0.4 MG SL tablet Commonly known as: NITROSTAT Place 1 tablet (0.4 mg total) under the tongue every 5 (five) minutes as needed.   omeprazole 20 MG capsule Commonly known as: PRILOSEC Take 20 mg by mouth daily.   polyethylene glycol 17 g packet Commonly known as: MIRALAX / GLYCOLAX Take 17  g by mouth daily.   predniSONE 5 MG tablet Commonly known as: DELTASONE Take 1 tablet by mouth daily.   SENNA-DOCUSATE SODIUM PO Take 1 tablet by mouth 2 (two) times daily. For constipation.   sertraline 50 MG tablet Commonly known as: ZOLOFT Take 50 mg by mouth every morning.   sodium chloride 0.65 % Soln nasal spray Commonly known as: OCEAN Place 1 spray into both nostrils as needed for congestion.   VITAMIN D3 PO Take 1,000 Units by mouth daily.       Review of Systems  Constitutional: Positive for unexpected weight change. Negative for appetite change, chills, fatigue and fever.  HENT: Negative for congestion, postnasal drip, rhinorrhea, sinus pressure, sinus pain, sneezing and sore throat.   Respiratory: Negative for cough, chest tightness, shortness of breath and wheezing.   Cardiovascular: Negative for chest pain, palpitations and leg swelling.  Gastrointestinal: Negative for abdominal distention, abdominal pain, constipation, diarrhea, nausea and vomiting.  Genitourinary: Negative for difficulty urinating, dysuria, flank pain and urgency.  Musculoskeletal: Positive for arthralgias and gait problem. Negative for joint swelling and myalgias.  Skin: Negative for color change, pallor and rash.  Neurological: Negative for dizziness, speech  difficulty, weakness, light-headedness and headaches.  Hematological: Does not bruise/bleed easily.  Psychiatric/Behavioral: Negative for agitation, behavioral problems and sleep disturbance. The patient is not nervous/anxious.     Immunization History  Administered Date(s) Administered  . Influenza Split 05/16/2014  . Moderna SARS-COVID-2 Vaccination 09/08/2019, 10/08/2019   Pertinent  Health Maintenance Due  Topic Date Due  . DEXA SCAN  Never done  . PNA vac Low Risk Adult (1 of 2 - PCV13) Never done  . INFLUENZA VACCINE  03/16/2020   No flowsheet data found.  Vitals:   04/03/20 1001  BP: 117/68  Pulse: 73  Resp: 18  Temp: (!) 97.3 F (36.3 C)  SpO2: 96%  Weight: 107 lb (48.5 kg)  Height: 5\' 3"  (1.6 m)   Body mass index is 18.95 kg/m. Physical Exam Vitals and nursing note reviewed.  Constitutional:      General: She is not in acute distress.    Appearance: She is underweight. She is not ill-appearing.     Interventions: Nasal cannula in place.  HENT:     Head: Normocephalic.     Mouth/Throat:     Mouth: Mucous membranes are moist.     Pharynx: Oropharynx is clear. No oropharyngeal exudate or posterior oropharyngeal erythema.  Eyes:     General: No scleral icterus.       Right eye: No discharge.        Left eye: No discharge.     Conjunctiva/sclera: Conjunctivae normal.     Pupils: Pupils are equal, round, and reactive to light.  Cardiovascular:     Rate and Rhythm: Normal rate and regular rhythm.     Pulses: Normal pulses.     Heart sounds: Murmur heard.  No friction rub. No gallop.   Pulmonary:     Effort: Pulmonary effort is normal. No respiratory distress.     Breath sounds: Normal breath sounds. No wheezing, rhonchi or rales.     Comments: Oxygen 2 Liters via Nasal cannula in place  Chest:     Chest wall: No tenderness.  Abdominal:     General: Bowel sounds are normal. There is no distension.     Palpations: Abdomen is soft. There is no mass.      Tenderness: There is no abdominal tenderness. There is no right  CVA tenderness, left CVA tenderness, guarding or rebound.  Musculoskeletal:        General: No swelling or tenderness.     Cervical back: Normal range of motion. No rigidity or tenderness.     Right lower leg: No edema.     Left lower leg: No edema.     Comments: Unsteady gait walks with left arm hemi walker.Left arm splint in place fingers pink,warm and moves without any difficulties.   Lymphadenopathy:     Cervical: No cervical adenopathy.  Skin:    General: Skin is warm.     Coloration: Skin is not pale.     Findings: No erythema or rash.  Neurological:     Mental Status: She is alert and oriented to person, place, and time.     Cranial Nerves: No cranial nerve deficit.     Motor: No weakness.     Gait: Gait abnormal.  Psychiatric:        Mood and Affect: Mood normal.        Behavior: Behavior normal.        Thought Content: Thought content normal.        Judgment: Judgment normal.    Labs reviewed: Recent Labs    03/27/20 0000 03/31/20 0000 04/02/20 0000  NA 133* 131* 128*  K 4.7 5.4* 5.1  CL 99 98* 96*  CO2 21 20 20   BUN 17 15 20   CREATININE 0.8 0.9 1.0  CALCIUM 8.6* 8.6* 9.1    Recent Labs    03/27/20 0000 03/31/20 0000 04/02/20 0000  WBC 6.2 6.4 7.5  HGB 8.5* 8.9* 8.9*  HCT 27* 28* 28*  PLT 404* 457* 460*   Lab Results  Component Value Date   TSH 1.31 01/31/2015   Lab Results  Component Value Date   HGBA1C  06/29/2008    5.2 (NOTE)   The ADA recommends the following therapeutic goal for glycemic   control related to Hgb A1C measurement:   Goal of Therapy:   < 7.0% Hgb A1C   Reference: American Diabetes Association: Clinical Practice   Recommendations 2008, Diabetes Care,  2008, 31:(Suppl 1).   Lab Results  Component Value Date   CHOL  07/22/2008    114        ATP III CLASSIFICATION:  <200     mg/dL   Desirable  200-239  mg/dL   Borderline High  >=240    mg/dL   High   HDL 43  07/22/2008   LDLCALC  07/22/2008    58        Total Cholesterol/HDL:CHD Risk Coronary Heart Disease Risk Table                     Men   Women  1/2 Average Risk   3.4   3.3   TRIG 65 07/22/2008   CHOLHDL 2.7 07/22/2008    Significant Diagnostic Results in last 30 days:  No results found.  Assessment/Plan  1.Weight loss, abnormal Has had a 3.4 lbs weight loss over 7 days.metastatic lesion in the brain,lung nodules and lesion on recent imaging could be a contributory factor.  - Nutritionist continue to follow up and initiate protein supplement as planned. - continue weekly weight check  - will consider adding Remeron if weight loss persist  - CBC,BMP in 1 week   2. Closed fracture of left distal radius and ulna, - splint in place  - continue current pain regimen  - continue to  follow up with Ortho as directed  - continue with PT/OT   Family/ staff Communication: Review plan with patient and facility   Labs/tests ordered:   - CBC,BMP in 1 week

## 2020-04-07 DIAGNOSIS — S32592D Other specified fracture of left pubis, subsequent encounter for fracture with routine healing: Secondary | ICD-10-CM | POA: Diagnosis not present

## 2020-04-07 DIAGNOSIS — S52502D Unspecified fracture of the lower end of left radius, subsequent encounter for closed fracture with routine healing: Secondary | ICD-10-CM | POA: Diagnosis not present

## 2020-04-07 DIAGNOSIS — S52602D Unspecified fracture of lower end of left ulna, subsequent encounter for closed fracture with routine healing: Secondary | ICD-10-CM | POA: Diagnosis not present

## 2020-04-08 ENCOUNTER — Telehealth: Payer: Self-pay | Admitting: Internal Medicine

## 2020-04-08 NOTE — Telephone Encounter (Signed)
Per Lovena Le front desk at [facility]:  her son just called the office asking me to leave you a note asking you if you could keep a check on her hemeglobin getting low because when it does she gets very weak. He had just asked if there was any way you could keep an eye out for that because she doesnt have another doctor close to [employer]. I guess her other doctor is farther away so I think youre really her main doctor now is what he made it seem like. She lives at [place].  Please let her son know:  Hemoglobin was stable as of 8/18 but we will recheck again in the morning to ensure it has remained so.  It was 8.9 last check.

## 2020-04-10 NOTE — Telephone Encounter (Signed)
I did not get any report today why it's was cancelled but I'm out of the facility now.May check with Nurse tomorrow since I wont's be in the facility until Monday.

## 2020-04-10 NOTE — Telephone Encounter (Signed)
I left barry a voicemail for him to call about Ms Ameyah hemoglobin levels  Per Lovena Le front desk at [facility]:  her son just called the office asking me to leave you a note asking you if you could keep a check on her hemeglobin getting low because when it does she gets very weak. He had just asked if there was any way you could keep an eye out for that because she doesnt have another doctor close to [employer]. I guess her other doctor is farther away so I think youre really her main doctor now is what he made it seem like. She lives at [place].  Please let her son know:  Hemoglobin was stable as of 8/18 but we will recheck again in the morning to ensure it has remained so.  It was 8.9 last check.      Documentation

## 2020-04-10 NOTE — Telephone Encounter (Signed)
I talked to [name] about the results and he would like for you really pay special attention to her hemoglobin and watch it closely because she has a history of hemoglobin anemia 50 years ago and almost died. He also want to put an * beside please watch her numbers and feels like 8.9 is low it should be around 10. I checked in [facility] the last  CBC with diff was ordered for 04/10/2020 but was canceled please advise.   Part of previous note.  Please let her son know: Hemoglobin was stable as of 8/18 but we will recheck again in the morning to ensure it has remained so. It was 8.9 last check.

## 2020-04-11 LAB — CBC AND DIFFERENTIAL
HCT: 30 — AB (ref 36–46)
Hemoglobin: 9.2 — AB (ref 12.0–16.0)
Platelets: 325 (ref 150–399)
WBC: 7.2

## 2020-04-11 LAB — CBC: RBC: 3.62 — AB (ref 3.87–5.11)

## 2020-04-11 NOTE — Telephone Encounter (Signed)
Apparently, there was some confusion about this order with the phlebotomist.  [name] was called today to come get her CBC this afternoon.

## 2020-04-15 ENCOUNTER — Telehealth: Payer: Self-pay

## 2020-04-15 NOTE — Telephone Encounter (Signed)
Paients son Alvester Chou called to find out if his mother's lab results had come in and if so what her Hemoglobin  was I didn't see any results in the system so I told him when they came one of [place] here at the office would give him a call  with the results I went to [place] and found the results and abstracted them into the system his number is [phone]

## 2020-04-16 ENCOUNTER — Telehealth: Payer: Self-pay

## 2020-04-16 NOTE — Telephone Encounter (Signed)
I believe Dinah reviewed those results when she was there.  They should be in [facility] through [employer].  The nurses there should have notified the resident of the comments on the labs.  I didn't see them personally b/c of when they returned.

## 2020-04-16 NOTE — Telephone Encounter (Signed)
Patient called and unable to leave voicemail. If patient calls back I will cc Chrae and Charlene in message so that they're aware.

## 2020-04-16 NOTE — Telephone Encounter (Signed)
Patient husband "Roderica Cathell" called and states that his wife "[name]" had blood work taken on 04/11/2020 and wanted to know the results. He states that wife has history of anemia. Patient states that he will call back tomorrow if he doesn't get results back today. Please Advise. Forward message to Chrae, Randell Patient, or Clinical Pool  if response is after hours today. I will be out of office 04/17/2020. Thank you.

## 2020-04-17 ENCOUNTER — Non-Acute Institutional Stay (SKILLED_NURSING_FACILITY): Payer: PPO | Admitting: Family

## 2020-04-17 ENCOUNTER — Encounter: Payer: Self-pay | Admitting: Family

## 2020-04-17 DIAGNOSIS — K219 Gastro-esophageal reflux disease without esophagitis: Secondary | ICD-10-CM

## 2020-04-17 DIAGNOSIS — S52602D Unspecified fracture of lower end of left ulna, subsequent encounter for closed fracture with routine healing: Secondary | ICD-10-CM

## 2020-04-17 DIAGNOSIS — F0280 Dementia in other diseases classified elsewhere without behavioral disturbance: Secondary | ICD-10-CM

## 2020-04-17 DIAGNOSIS — I10 Essential (primary) hypertension: Secondary | ICD-10-CM

## 2020-04-17 DIAGNOSIS — S52502D Unspecified fracture of the lower end of left radius, subsequent encounter for closed fracture with routine healing: Secondary | ICD-10-CM | POA: Diagnosis not present

## 2020-04-17 DIAGNOSIS — J449 Chronic obstructive pulmonary disease, unspecified: Secondary | ICD-10-CM | POA: Diagnosis not present

## 2020-04-17 DIAGNOSIS — G301 Alzheimer's disease with late onset: Secondary | ICD-10-CM

## 2020-04-17 NOTE — Progress Notes (Signed)
Location:    [facility] Room Number: 101/P Place of Service:  SNF [id number]) Provider:  Marlowe Sax NP  Monico Blitz, MD  Patient Care Team: Monico Blitz, MD as PCP - General (Internal Medicine) Carlena Bjornstad, MD as Consulting Physician (Cardiology) Raynelle Bring, MD as Consulting Physician (Urology)  Extended Emergency Contact Information Primary Emergency Contact: Lievanos,Barry Address: [phone]          [place], [place] [address] Johnnette Litter of [place] Phone: [phone] Mobile Phone: [phone] Relation: Son Secondary Emergency Contact: Laren Boom [place] of [place] Phone: [phone] Mobile Phone: [phone] Relation: Daughter  Code Status:  DNR Goals of care: Advanced Directive information Advanced Directives 04/17/2020  Does Patient Have a Medical Advance Directive? Yes  Type of Advance Directive Out of facility DNR (pink MOST or yellow form)  Does patient want to make changes to medical advance directive? No - Patient declined  Copy of [place] in Chart? -  Would patient like information on creating a medical advance directive? -  Pre-existing out of facility DNR order (yellow form or pink MOST form) Yellow form placed in chart (order not valid for inpatient use)     Chief Complaint  Patient presents with  . Medical Management of Chronic Issues    Routine Visit of Medical Management    HPI:  Pt is a 85 y.o. female seen today for medical management of chronic diseases. She is has a medical history of Hypertension,COPD on oxygen via nasal cannula,CAD,Peripheral artery disease,metastic carcinoma involving lung with unknown primary site, GERD,Osteoporosis,CKD,Gout among other conditions.she continues with her rehab her at [facility] post hospital admission 03/16/2020 -03/21/2020 at [facility] after sustaining a fall at home walking to the Bathroom.she sustained left distal radius  fracture,acute minimally displaced fracture of left pubic body and age-indeterminate compression deformity at T6.she continue to follow up with Orthopedic.working well with PT/OT.she has left arm split.requires assistance with her ADL's.states pain is under control with tylenol.    Past Medical History:  Diagnosis Date  . Carotid artery disease ([place])    Doppler, January, 2010, mild plaque, no major stenoses  . CKD (chronic kidney disease)    Creatinine 1.4, August, 2012  . Congenital prolapsed rectum   . COPD (chronic obstructive pulmonary disease) ([place])    Home O2, Severe pneumonia 2008  . COPD (chronic obstructive pulmonary disease) ([place])   . Coronary artery disease    Catheterization November, 2009, 2 of 4 grafts patent,  . Coronary artery disease   . 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 Drug therapy    Prednisone intermittently for anemia  . Ejection fraction    EF normal, cath, 2009 /  EF 55-60%, echo, July, 2012, mild septal bounce  . GERD (gastroesophageal reflux disease)   . Gout   . Hemolytic anemia (HCC)    Darovsky, Probable Coombs negative hemolytic anemia, prednisone therapy intermittent  . History of prolonged Q-T interval on ECG    Intermittent  . Hx of CABG    2002  . Mitral regurgitation    Mild  . Osteoporosis   . PAD (peripheral artery disease) (HCC)    Mild, legs  . PVC's (premature ventricular contractions)   . Renal carcinoma ([place])    Treated by freezing by urology, 2012  . Shoulder pain    Rotator cuff  .  Syncope    January, 2010, probable orthostatic and volume  /  he vent recorder may, 2011, normal sinus rhythm   Past Surgical History:  Procedure Laterality Date  . ABDOMINAL HYSTERECTOMY    . CARDIAC CATHETERIZATION  2009   2/4 occluded grafts most concerning area was the ostieal stenosis of the LM . Both bypass grafts to the anterioro cirulation are occluded. The mida and distal lLAD gets some filling from the RCA.Stenosis  of the LM could potentially sompromise flow to the promixmal and midportion of in the LAD. Intra coronary [place] could be considered should she continue to be symptomatic.  Tx medically for now.  Marland Kitchen COPD    . CORONARY ARTERY BYPASS GRAFT    . coronary artery disease     . GALLBLADDER SURGERY    . KIDNEY SURGERY    . RECTAL BIOPSY    . SHOULDER SURGERY      Allergies  Allergen Reactions  . Codeine     REACTION: hallucinate    Allergies as of 04/17/2020      Reactions   Codeine    REACTION: hallucinate      Medication List       Accurate as of April 17, 2020  2:38 PM. If you have any questions, ask your nurse or doctor.        acetaminophen 325 MG tablet Commonly known as: TYLENOL Take 650 mg by mouth every 6 (six) hours as needed for mild pain or moderate pain.   Albuterol Sulfate 2.5 MG/0.5ML Nebu Inhale 2.5 mg into the lungs every 6 (six) hours as needed.   allopurinol 300 MG tablet Commonly known as: ZYLOPRIM Take 300 mg by mouth daily.   amLODipine 5 MG tablet Commonly known as: NORVASC Take 5 mg by mouth daily.   Anoro Ellipta 62.5-25 MCG/INH Aepb Generic drug: umeclidinium-vilanterol Inhale 1 puff into the lungs daily. Rinse mouth with water and spit after usage.   aspirin EC 81 MG tablet Take 81 mg by mouth 2 (two) times daily. Starting 05/02/2020 take 1 tablet by mouth daily for DVT Prophylaxis   celecoxib 200 MG capsule Commonly known as: CELEBREX Take 1 capsule by mouth daily.   colchicine 0.6 MG tablet Take 0.6 mg by mouth 2 (two) times daily as needed.   donepezil 10 MG tablet Commonly known as: ARICEPT Take 10 mg by mouth daily.   gabapentin 100 MG capsule Commonly known as: NEURONTIN Take 100 mg by mouth 3 (three) times daily.   LACTOBACILLUS PO Take 1 tablet by mouth daily.   levETIRAcetam 500 MG tablet Commonly known as: KEPPRA Take 500 mg by mouth 2 (two) times daily.   LORazepam 0.5 MG tablet Commonly known as: ATIVAN Take 0.5 mg  by mouth at bedtime.   melatonin 3 MG Tabs tablet Take 3 mg by mouth at bedtime.   metoprolol succinate 25 MG 24 hr tablet Commonly known as: TOPROL-XL Take 25 mg by mouth every morning.   MULTIVITAMIN & MINERAL PO Take 1 tablet by mouth daily.   neomycin-bacitracin-polymyxin [id number] ointment Apply 1 application topically 4 (four) times daily as needed (For Breaking Skin).   nitroGLYCERIN 0.4 MG SL tablet Commonly known as: NITROSTAT Place 1 tablet (0.4 mg total) under the tongue every 5 (five) minutes as needed.   NON FORMULARY Med pass bid d/t weight loss   omeprazole 20 MG capsule Commonly known as: PRILOSEC Take 20 mg by mouth daily.   polyethylene glycol 17 g packet Commonly  known as: MIRALAX / GLYCOLAX Take 17 g by mouth daily.   predniSONE 5 MG tablet Commonly known as: DELTASONE Take 1 tablet by mouth daily.   SENNA-DOCUSATE SODIUM PO Take 1 tablet by mouth 2 (two) times daily. For constipation.   sertraline 50 MG tablet Commonly known as: ZOLOFT Take 50 mg by mouth every morning. For Depression   sodium chloride 0.65 % Soln nasal spray Commonly known as: OCEAN Place 1 spray into both nostrils every 6 (six) hours as needed for congestion.   VITAMIN D3 PO Take 1,000 Units by mouth daily.       Review of Systems  Constitutional: Negative for appetite change, chills, fatigue and fever.  HENT: Positive for hearing loss. Negative for congestion, rhinorrhea, sinus pressure, sinus pain, sneezing and sore throat.   Eyes: Negative for discharge, redness and itching.  Respiratory: Negative for cough, chest tightness, shortness of breath and wheezing.        Oxygen via nasal cannula   Cardiovascular: Negative for chest pain, palpitations and leg swelling.  Gastrointestinal: Negative for abdominal distention, abdominal pain, constipation, diarrhea, nausea and vomiting.  Endocrine: Negative for cold intolerance, heat intolerance, polydipsia, polyphagia and  polyuria.  Genitourinary: Negative for difficulty urinating, flank pain, frequency and urgency.  Musculoskeletal: Positive for arthralgias and gait problem. Negative for joint swelling, myalgias and neck pain.  Skin: Negative for color change, pallor and rash.  Neurological: Negative for dizziness, speech difficulty, weakness, light-headedness and headaches.  Hematological: Does not bruise/bleed easily.  Psychiatric/Behavioral: Negative for agitation, behavioral problems, confusion and sleep disturbance. The patient is not nervous/anxious.     Immunization History  Administered Date(s) Administered  . Influenza Split 05/16/2014  . Moderna SARS-COVID-2 Vaccination 09/08/2019, 10/08/2019   Pertinent  Health Maintenance Due  Topic Date Due  . DEXA SCAN  Never done  . PNA vac Low Risk Adult (1 of 2 - PCV13) Never done  . INFLUENZA VACCINE  03/16/2020   No flowsheet data found.   Vitals:   04/17/20 1437  BP: 128/78  Pulse: 69  Resp: 17  Temp: 97.7 F (36.5 C)  TempSrc: Oral  SpO2: 94%  Weight: 97 lb (44 kg)  Height: 5\' 2"  (1.575 m)   Body mass index is 17.74 kg/m. Physical Exam Vitals and nursing note reviewed.  Constitutional:      General: She is not in acute distress.    Appearance: She is underweight. She is not ill-appearing.     Interventions: Nasal cannula in place.  HENT:     Head: Normocephalic.     Nose: Nose normal. No congestion or rhinorrhea.     Mouth/Throat:     Mouth: Mucous membranes are moist.     Pharynx: Oropharynx is clear. No oropharyngeal exudate or posterior oropharyngeal erythema.  Eyes:     General: No scleral icterus.       Right eye: No discharge.        Left eye: No discharge.     Conjunctiva/sclera: Conjunctivae normal.     Pupils: Pupils are equal, round, and reactive to light.  Cardiovascular:     Rate and Rhythm: Normal rate and regular rhythm.     Pulses: Normal pulses.     Heart sounds: Murmur heard.  No friction rub. No gallop.    Pulmonary:     Effort: Pulmonary effort is normal. No respiratory distress.     Breath sounds: Normal breath sounds. No wheezing, rhonchi or rales.  Chest:  Chest wall: No tenderness.  Abdominal:     General: Bowel sounds are normal. There is no distension.     Palpations: Abdomen is soft. There is no mass.     Tenderness: There is no abdominal tenderness. There is no right CVA tenderness, left CVA tenderness, guarding or rebound.  Musculoskeletal:        General: No swelling or tenderness.     Cervical back: Normal range of motion. No rigidity or tenderness.     Right lower leg: No edema.     Left lower leg: No edema.     Comments: Unsteady gait.left arm splint in place   Lymphadenopathy:     Cervical: No cervical adenopathy.  Skin:    General: Skin is warm and dry.     Coloration: Skin is not pale.     Findings: No bruising, erythema or rash.  Neurological:     Mental Status: She is alert. Mental status is at baseline.     Cranial Nerves: No cranial nerve deficit.     Sensory: No sensory deficit.     Motor: No weakness.     Coordination: Coordination normal.     Gait: Gait abnormal.  Psychiatric:        Mood and Affect: Mood normal.        Behavior: Behavior normal.        Thought Content: Thought content normal.        Judgment: Judgment normal.     Labs reviewed: Recent Labs    03/27/20 0000 03/31/20 0000 04/02/20 0000  NA 133* 131* 128*  K 4.7 5.4* 5.1  CL 99 98* 96*  CO2 21 20 20   BUN 17 15 20   CREATININE 0.8 0.9 1.0  CALCIUM 8.6* 8.6* 9.1   No results for input(s): AST, ALT, ALKPHOS, BILITOT, PROT, ALBUMIN in the last 8760 hours. Recent Labs    03/31/20 0000 04/02/20 0000 04/11/20 0000  WBC 6.4 7.5 7.2  HGB 8.9* 8.9* 9.2*  HCT 28* 28* 30*  PLT 457* 460* 325   Lab Results  Component Value Date   TSH 1.31 01/31/2015   Lab Results  Component Value Date   HGBA1C  06/29/2008    5.2 (NOTE)   The ADA recommends the following therapeutic goal  for glycemic   control related to Hgb A1C measurement:   Goal of Therapy:   < 7.0% Hgb A1C   Reference: American Diabetes Association: Clinical Practice   Recommendations 2008, Diabetes Care,  2008, 31:(Suppl 1).   Lab Results  Component Value Date   CHOL  07/22/2008    114        ATP III CLASSIFICATION:  <200     mg/dL   Desirable  200-239  mg/dL   Borderline High  >=240    mg/dL   High   HDL 43 07/22/2008   LDLCALC  07/22/2008    58        Total Cholesterol/HDL:CHD Risk Coronary Heart Disease Risk Table                     Men   Women  1/2 Average Risk   3.4   3.3   TRIG 65 07/22/2008   CHOLHDL 2.7 07/22/2008    Significant Diagnostic Results in last 30 days:  No results found.  Assessment/Plan 1. COPD II Breathing stable.on oxygen 2 liters via nasal cannula. - continue on Albuterol and Anoro Ellipta   2. Closed fracture of distal  ends of left radius and ulna with routine healing, subsequent encounter - continue with PT/OT  - pain under control - continue to follow up with Orthopedic as directed  3. Gastroesophageal reflux disease without esophagitis Symptoms controlled. - continue on Omeprazole.   4. Essential hypertension B/p well controlled. - continue on metoprolol and Amlodipine  On ASA EC   5. Late onset Alzheimer's disease without behavioral disturbance ([place]) No behavioral issues noted. - continue on donepezil ,Lorazepam and sertraline.  - continue with supportive care   Family/ staff Communication: Reviewed plan of care with patient and facility Nurse   Labs/tests ordered:  Has CBC orders

## 2020-04-17 NOTE — Telephone Encounter (Signed)
Patient called stating she was returning the call to someone at this office.  I advised patient that any inquires that she has will need to be directed to [facility]. Patient and/or family members need to call [facility] and speak with one of the nurses at that location. Patient verbalized understanding.  Side Note: Patient is not an established patient here at [facility], we should not be taken messages and addressing concerns.

## 2020-04-23 ENCOUNTER — Other Ambulatory Visit: Payer: Self-pay

## 2020-04-23 ENCOUNTER — Encounter (HOSPITAL_COMMUNITY): Payer: Self-pay | Admitting: Emergency Medicine

## 2020-04-23 ENCOUNTER — Non-Acute Institutional Stay (SKILLED_NURSING_FACILITY): Payer: PPO | Admitting: Family

## 2020-04-23 ENCOUNTER — Encounter: Payer: Self-pay | Admitting: Family

## 2020-04-23 ENCOUNTER — Other Ambulatory Visit: Payer: Self-pay | Admitting: Family

## 2020-04-23 ENCOUNTER — Emergency Department (HOSPITAL_COMMUNITY)
Admission: EM | Admit: 2020-04-23 | Discharge: 2020-04-24 | Disposition: A | Discharge status: Home / Self Care | Payer: PPO | Attending: Emergency Medicine | Admitting: Emergency Medicine

## 2020-04-23 DIAGNOSIS — J449 Chronic obstructive pulmonary disease, unspecified: Secondary | ICD-10-CM | POA: Insufficient documentation

## 2020-04-23 DIAGNOSIS — I959 Hypotension, unspecified: Secondary | ICD-10-CM | POA: Diagnosis not present

## 2020-04-23 DIAGNOSIS — Z7982 Long term (current) use of aspirin: Secondary | ICD-10-CM | POA: Insufficient documentation

## 2020-04-23 DIAGNOSIS — K922 Gastrointestinal hemorrhage, unspecified: Secondary | ICD-10-CM

## 2020-04-23 DIAGNOSIS — R600 Localized edema: Secondary | ICD-10-CM | POA: Diagnosis not present

## 2020-04-23 DIAGNOSIS — Z87891 Personal history of nicotine dependence: Secondary | ICD-10-CM | POA: Diagnosis not present

## 2020-04-23 DIAGNOSIS — I517 Cardiomegaly: Secondary | ICD-10-CM | POA: Diagnosis not present

## 2020-04-23 DIAGNOSIS — I509 Heart failure, unspecified: Secondary | ICD-10-CM | POA: Diagnosis not present

## 2020-04-23 DIAGNOSIS — D649 Anemia, unspecified: Secondary | ICD-10-CM | POA: Insufficient documentation

## 2020-04-23 DIAGNOSIS — Z85528 Personal history of other malignant neoplasm of kidney: Secondary | ICD-10-CM | POA: Insufficient documentation

## 2020-04-23 DIAGNOSIS — R9431 Abnormal electrocardiogram [ECG] [EKG]: Secondary | ICD-10-CM | POA: Diagnosis not present

## 2020-04-23 DIAGNOSIS — R2243 Localized swelling, mass and lump, lower limb, bilateral: Secondary | ICD-10-CM | POA: Diagnosis present

## 2020-04-23 DIAGNOSIS — R7889 Finding of other specified substances, not normally found in blood: Secondary | ICD-10-CM | POA: Diagnosis not present

## 2020-04-23 DIAGNOSIS — R69 Illness, unspecified: Secondary | ICD-10-CM | POA: Insufficient documentation

## 2020-04-23 DIAGNOSIS — Z79899 Other long term (current) drug therapy: Secondary | ICD-10-CM | POA: Diagnosis not present

## 2020-04-23 DIAGNOSIS — R42 Dizziness and giddiness: Secondary | ICD-10-CM | POA: Diagnosis not present

## 2020-04-23 DIAGNOSIS — J9811 Atelectasis: Secondary | ICD-10-CM | POA: Diagnosis not present

## 2020-04-23 DIAGNOSIS — N189 Chronic kidney disease, unspecified: Secondary | ICD-10-CM | POA: Insufficient documentation

## 2020-04-23 DIAGNOSIS — I2510 Atherosclerotic heart disease of native coronary artery without angina pectoris: Secondary | ICD-10-CM | POA: Insufficient documentation

## 2020-04-23 DIAGNOSIS — F418 Other specified anxiety disorders: Secondary | ICD-10-CM | POA: Diagnosis not present

## 2020-04-23 DIAGNOSIS — Z7951 Long term (current) use of inhaled steroids: Secondary | ICD-10-CM | POA: Diagnosis not present

## 2020-04-23 DIAGNOSIS — R52 Pain, unspecified: Secondary | ICD-10-CM | POA: Diagnosis not present

## 2020-04-23 DIAGNOSIS — I10 Essential (primary) hypertension: Secondary | ICD-10-CM | POA: Diagnosis not present

## 2020-04-23 LAB — COMPREHENSIVE METABOLIC PANEL
ALT: 10 U/L (ref 0–44)
AST: 18 U/L (ref 15–41)
Albumin: 3.2 g/dL — ABNORMAL LOW (ref 3.5–5.0)
Alkaline Phosphatase: 71 U/L (ref 38–126)
Anion gap: 9 (ref 5–15)
BUN: 25 mg/dL — ABNORMAL HIGH (ref 8–23)
CO2: 23 mmol/L (ref 22–32)
Calcium: 9.3 mg/dL (ref 8.9–10.3)
Chloride: 104 mmol/L (ref 98–111)
Creatinine, Ser: 1.14 mg/dL — ABNORMAL HIGH (ref 0.44–1.00)
GFR calc Af Amer: 51 mL/min — ABNORMAL LOW (ref >60)
GFR calc non Af Amer: 44 mL/min — ABNORMAL LOW (ref >60)
Glucose, Bld: 127 mg/dL — ABNORMAL HIGH (ref 70–99)
Potassium: 5.2 mmol/L — ABNORMAL HIGH (ref 3.5–5.1)
Sodium: 136 mmol/L (ref 135–145)
Total Bilirubin: 0.3 mg/dL (ref 0.3–1.2)
Total Protein: 5.6 g/dL — ABNORMAL LOW (ref 6.5–8.1)

## 2020-04-23 LAB — CBC AND DIFFERENTIAL
HCT: 27 — AB (ref 36–46)
Hemoglobin: 8.3 — AB (ref 12.0–16.0)
Platelets: 329 (ref 150–399)
WBC: 6.9

## 2020-04-23 LAB — CBC
HCT: 28 % — ABNORMAL LOW (ref 36.0–46.0)
Hemoglobin: 8.1 g/dL — ABNORMAL LOW (ref 12.0–15.0)
MCH: 25.6 pg — ABNORMAL LOW (ref 26.0–34.0)
MCHC: 28.9 g/dL — ABNORMAL LOW (ref 30.0–36.0)
MCV: 88.3 fL (ref 80.0–100.0)
Platelets: 318 10*3/uL (ref 150–400)
RBC: 3.17 MIL/uL — ABNORMAL LOW (ref 3.87–5.11)
RBC: 3.26 — AB (ref 3.87–5.11)
RDW: 15 % (ref 11.5–15.5)
WBC: 6.9 10*3/uL (ref 4.0–10.5)
nRBC: 0 % (ref 0.0–0.2)

## 2020-04-23 MED ORDER — LORAZEPAM 0.5 MG PO TABS
0.5000 mg | ORAL_TABLET | Freq: Every day | ORAL | 0 refills | Status: DC
Start: 2020-04-23 — End: 2020-05-12

## 2020-04-23 NOTE — Progress Notes (Signed)
Location:  [facility] Room Number: 101-P Place of Service:  SNF [phone]) Provider: Florencia Zaccaro FNP-C  Monico Blitz, MD  Patient Care Team: Monico Blitz, MD as PCP - General (Internal Medicine) Carlena Bjornstad, MD as Consulting Physician (Cardiology) Raynelle Bring, MD as Consulting Physician (Urology)  Extended Emergency Contact Information Primary Emergency Contact: Vandenberg,Barry Address: [phone]          [place], [place] [address] Johnnette Litter of [place] Phone: [phone] Mobile Phone: [phone] Relation: Son Secondary Emergency Contact: Laren Boom [place] of [place] Phone: [phone] Mobile Phone: [phone] Relation: Daughter  Code Status:  DNR Goals of care: Advanced Directive information Advanced Directives 04/23/2020  Does Patient Have a Medical Advance Directive? Yes  Type of Advance Directive Out of facility DNR (pink MOST or yellow form)  Does patient want to make changes to medical advance directive? No - Patient declined  Copy of [place] in Chart? -  Would patient like information on creating a medical advance directive? -  Pre-existing out of facility DNR order (yellow form or pink MOST form) Yellow form placed in chart (order not valid for inpatient use)     Chief Complaint  Patient presents with  . Acute Visit    Dizziness and Fatigue with Abnormal Labs.    HPI:  Pt is a 85 y.o. female seen today for an acute visit for evaluation of dizziness,fatigue and abnormal labs results.Her Hgb 8.3 previous 9.2 She denies any signs of bleeding or dark stool.she was just feeling tired all the time.   Past Medical History:  Diagnosis Date  . Carotid artery disease ([facility])    Doppler, January, 2010, mild plaque, no major stenoses  . CKD (chronic kidney disease)    Creatinine 1.4, August, 2012  . Congenital prolapsed rectum   . COPD (chronic obstructive pulmonary  disease) ([facility])    Home O2, Severe pneumonia 2008  . COPD (chronic obstructive pulmonary disease) ([facility])   . Coronary artery disease    Catheterization November, 2009, 2 of 4 grafts patent,  . Coronary artery disease   . 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 Drug therapy    Prednisone intermittently for anemia  . Ejection fraction    EF normal, cath, 2009 /  EF 55-60%, echo, July, 2012, mild septal bounce  . GERD (gastroesophageal reflux disease)   . Gout   . Hemolytic anemia (HCC)    Darovsky, Probable Coombs negative hemolytic anemia, prednisone therapy intermittent  . History of prolonged Q-T interval on ECG    Intermittent  . Hx of CABG    2002  . Mitral regurgitation    Mild  . Osteoporosis   . PAD (peripheral artery disease) (HCC)    Mild, legs  . PVC's (premature ventricular contractions)   . Renal carcinoma ([facility])    Treated by freezing by urology, 2012  . Shoulder pain    Rotator cuff  . Syncope    January, 2010, probable orthostatic and volume  /  he vent recorder may, 2011, normal sinus rhythm   Past Surgical History:  Procedure Laterality Date  . ABDOMINAL HYSTERECTOMY    . CARDIAC CATHETERIZATION  2009   2/4 occluded grafts most concerning area was the ostieal stenosis of the LM . Both bypass grafts to the anterioro cirulation are occluded. The mida and  distal lLAD gets some filling from the RCA.Stenosis of the LM could potentially sompromise flow to the promixmal and midportion of in the LAD. Intra coronary [place] could be considered should she continue to be symptomatic.  Tx medically for now.  Marland Kitchen COPD    . CORONARY ARTERY BYPASS GRAFT    . coronary artery disease     . GALLBLADDER SURGERY    . KIDNEY SURGERY    . RECTAL BIOPSY    . SHOULDER SURGERY      Allergies  Allergen Reactions  . Codeine     REACTION: hallucinate    Outpatient Encounter Medications as of 04/23/2020  Medication Sig  . acetaminophen (TYLENOL) 325 MG tablet Take 650  mg by mouth every 6 (six) hours as needed for mild pain or moderate pain.   . Albuterol Sulfate 2.5 MG/0.5ML NEBU Inhale 2.5 mg into the lungs every 6 (six) hours as needed.  Marland Kitchen allopurinol (ZYLOPRIM) 300 MG tablet Take 300 mg by mouth daily.  Marland Kitchen amLODipine (NORVASC) 5 MG tablet Take 5 mg by mouth daily.  Marland Kitchen aspirin EC 81 MG tablet Take 81 mg by mouth 2 (two) times daily. Starting 05/02/2020 take 1 tablet by mouth daily for DVT Prophylaxis  . celecoxib (CELEBREX) 200 MG capsule Take 1 capsule by mouth daily.  . Cholecalciferol (VITAMIN D3 PO) Take 1,000 Units by mouth daily.  . colchicine 0.6 MG tablet Take 0.6 mg by mouth 2 (two) times daily as needed.   . donepezil (ARICEPT) 10 MG tablet Take 10 mg by mouth daily.  Marland Kitchen gabapentin (NEURONTIN) 100 MG capsule Take 100 mg by mouth 3 (three) times daily.  Marland Kitchen levETIRAcetam (KEPPRA) 500 MG tablet Take 500 mg by mouth 2 (two) times daily.  Marland Kitchen LORazepam (ATIVAN) 0.5 MG tablet Take 1 tablet (0.5 mg total) by mouth at bedtime.  . melatonin 3 MG TABS tablet Take 3 mg by mouth at bedtime.  . Menthol, Topical Analgesic, (BIOFREEZE) 4 % GEL Apply topically 4 (four) times daily as needed.  . metoprolol succinate (TOPROL-XL) 25 MG 24 hr tablet Take 25 mg by mouth every morning.   . Multiple Vitamins-Minerals (MULTIVITAMIN & MINERAL PO) Take 1 tablet by mouth daily.  Marland Kitchen neomycin-bacitracin-polymyxin (NEOSPORIN) [phone] ointment Apply 1 application topically 4 (four) times daily as needed (For Breaking Skin).  . nitroGLYCERIN (NITROSTAT) 0.4 MG SL tablet Place 1 tablet (0.4 mg total) under the tongue every 5 (five) minutes as needed.  . NON FORMULARY Med pass bid d/t weight loss  . polyethylene glycol (MIRALAX / GLYCOLAX) 17 g packet Take 17 g by mouth daily.  . sertraline (ZOLOFT) 50 MG tablet Take 50 mg by mouth every morning. For Depression  . umeclidinium-vilanterol (ANORO ELLIPTA) 62.5-25 MCG/INH AEPB Inhale 1 puff into the lungs daily. Rinse mouth with water and  spit after usage.  . [DISCONTINUED] LACTOBACILLUS PO Take 1 tablet by mouth daily.  . [DISCONTINUED] omeprazole (PRILOSEC) 20 MG capsule Take 20 mg by mouth daily.  . [DISCONTINUED] predniSONE (DELTASONE) 5 MG tablet Take 1 tablet by mouth daily.  . [DISCONTINUED] Sennosides-Docusate Sodium (SENNA-DOCUSATE SODIUM PO) Take 1 tablet by mouth 2 (two) times daily. For constipation.  . [DISCONTINUED] sodium chloride (OCEAN) 0.65 % SOLN nasal spray Place 1 spray into both nostrils every 6 (six) hours as needed for congestion.    No facility-administered encounter medications on file as of 04/23/2020.    Review of Systems  Constitutional: Positive for fatigue. Negative for appetite change, chills and  fever.  HENT: Positive for hearing loss. Negative for congestion, nosebleeds, rhinorrhea, sinus pressure, sinus pain, sneezing and sore throat.   Respiratory: Negative for cough, chest tightness, shortness of breath and wheezing.        On oxygen via nasal canula   Cardiovascular: Negative for chest pain, palpitations and leg swelling.  Gastrointestinal: Negative for abdominal distention, abdominal pain, blood in stool, constipation, diarrhea, nausea and vomiting.  Musculoskeletal: Positive for gait problem. Negative for joint swelling and myalgias.  Skin: Positive for pallor. Negative for color change and rash.  Neurological: Positive for dizziness. Negative for speech difficulty, light-headedness and headaches.       Generalized weakness     Immunization History  Administered Date(s) Administered  . Influenza Split 05/16/2014  . Moderna SARS-COVID-2 Vaccination 09/08/2019, 10/08/2019   Pertinent  Health Maintenance Due  Topic Date Due  . DEXA SCAN  Never done  . PNA vac Low Risk Adult (1 of 2 - PCV13) Never done  . INFLUENZA VACCINE  03/16/2020   No flowsheet data found.   Vitals:   04/23/20 1543  BP: 131/65  Pulse: 82  Resp: 18  Temp: (!) 97.1 F (36.2 C)  SpO2: 95%  Weight: 97 lb  6.4 oz (44.2 kg)  Height: 5\' 2"  (1.575 m)   Body mass index is 17.81 kg/m. Physical Exam Vitals and nursing note reviewed.  Constitutional:      General: She is not in acute distress.    Appearance: She is underweight. She is not ill-appearing.     Interventions: Nasal cannula in place.  HENT:     Head: Normocephalic.     Comments: Right ear hearing aid in place.HOH     Nose: Nose normal. No congestion or rhinorrhea.     Mouth/Throat:     Mouth: Mucous membranes are moist.     Pharynx: Oropharynx is clear. No oropharyngeal exudate or posterior oropharyngeal erythema.  Eyes:     General: No scleral icterus.       Right eye: No discharge.        Left eye: No discharge.     Conjunctiva/sclera: Conjunctivae normal.     Pupils: Pupils are equal, round, and reactive to light.  Cardiovascular:     Rate and Rhythm: Normal rate and regular rhythm.     Pulses: Normal pulses.     Heart sounds: Murmur heard.  No friction rub. No gallop.   Pulmonary:     Effort: Pulmonary effort is normal. No respiratory distress.     Breath sounds: Normal breath sounds. No wheezing, rhonchi or rales.     Comments: Oxygen via nasal canula  Chest:     Chest wall: No tenderness.  Abdominal:     General: Bowel sounds are normal. There is no distension.     Palpations: Abdomen is soft. There is no mass.     Tenderness: There is no abdominal tenderness. There is no right CVA tenderness, left CVA tenderness, guarding or rebound.  Musculoskeletal:        General: No swelling or tenderness.     Comments: Unsteady gait.right leg chronic 1 - 2+ edema   Skin:    General: Skin is warm and dry.     Coloration: Skin is pale.     Findings: No erythema, lesion or rash.  Neurological:     Mental Status: She is alert and oriented to person, place, and time.     Cranial Nerves: No cranial nerve deficit.  Sensory: No sensory deficit.     Motor: No weakness.     Coordination: Coordination normal.     Gait: Gait  abnormal.  Psychiatric:        Mood and Affect: Mood normal.        Behavior: Behavior normal.        Judgment: Judgment normal.     Labs reviewed: Recent Labs    03/27/20 0000 03/31/20 0000 04/02/20 0000  NA 133* 131* 128*  K 4.7 5.4* 5.1  CL 99 98* 96*  CO2 21 20 20   BUN 17 15 20   CREATININE 0.8 0.9 1.0  CALCIUM 8.6* 8.6* 9.1    Recent Labs    04/02/20 0000 04/11/20 0000 04/23/20 0000  WBC 7.5 7.2 6.9  HGB 8.9* 9.2* 8.3*  HCT 28* 30* 27*  PLT 460* 325 329   Lab Results  Component Value Date   TSH 1.31 01/31/2015   Lab Results  Component Value Date   HGBA1C  06/29/2008    5.2 (NOTE)   The ADA recommends the following therapeutic goal for glycemic   control related to Hgb A1C measurement:   Goal of Therapy:   < 7.0% Hgb A1C   Reference: American Diabetes Association: Clinical Practice   Recommendations 2008, Diabetes Care,  2008, 31:(Suppl 1).   Lab Results  Component Value Date   CHOL  07/22/2008    114        ATP III CLASSIFICATION:  <200     mg/dL   Desirable  200-239  mg/dL   Borderline High  >=240    mg/dL   High   HDL 43 07/22/2008   LDLCALC  07/22/2008    58        Total Cholesterol/HDL:CHD Risk Coronary Heart Disease Risk Table                     Men   Women  1/2 Average Risk   3.4   3.3   TRIG 65 07/22/2008   CHOLHDL 2.7 07/22/2008    Significant Diagnostic Results in last 30 days:  No results found.  Assessment/Plan  Symptomatic anemia Reports dizziness and fatigue.Hgb has dropped from 9.2 to 8.3  No signs of bleeding. - send to ED for evaluation.  Family/ staff Communication: Reviewed plan of care with patient and facility Nurse  Labs/tests ordered: Send to ED   Next Appointment: Send to ED as above for evaluation of anemia.  Sandrea Hughs, NP

## 2020-04-23 NOTE — ED Triage Notes (Addendum)
Patient arrives to ED via EMS to triage with complaints of a hemoglobin of 8.3 at her facility. Per pt she has hx of anemia but recently has had some dark diarrhea. Pt states that she has also had some burning when she pees over the past week. Pt recent has recent pelvic and ulna fracture. Pt denies any pain.

## 2020-04-23 NOTE — Progress Notes (Signed)
Location:    [facility].   [facility] Room Number: 101-P Place of Service:  SNF (31) Provider:  Marlowe Sax, NP    Patient Care Team: Monico Blitz, MD as PCP - General (Internal Medicine) Carlena Bjornstad, MD as Consulting Physician (Cardiology) Raynelle Bring, MD as Consulting Physician (Urology)  Extended Emergency Contact Information Primary Emergency Contact: Baksh,Barry Address: [phone]          [place], [place] [address] Johnnette Litter of [place] Phone: [phone] Mobile Phone: [phone] Relation: Son Secondary Emergency Contact: Laren Boom [place] of [place] Phone: [phone] Mobile Phone: [phone] Relation: Daughter  Code Status:  DNR Goals of care: Advanced Directive information Advanced Directives 04/23/2020  Does Patient Have a Medical Advance Directive? Yes  Type of Advance Directive Out of facility DNR (pink MOST or yellow form)  Does patient want to make changes to medical advance directive? No - Patient declined  Copy of [place] in Chart? -  Would patient like information on creating a medical advance directive? -  Pre-existing out of facility DNR order (yellow form or pink MOST form) Yellow form placed in chart (order not valid for inpatient use)     Chief Complaint  Patient presents with   Acute Visit    Dizziness and Fatigue with Abnormal Labs.    HPI:  Pt is a 85 y.o. female seen today for an acute visit for    Past Medical History:  Diagnosis Date   Carotid artery disease ([facility])    Doppler, January, 2010, mild plaque, no major stenoses   CKD (chronic kidney disease)    Creatinine 1.4, August, 2012   Congenital prolapsed rectum    COPD (chronic obstructive pulmonary disease) ([facility])    Home O2, Severe pneumonia 2008   COPD (chronic obstructive pulmonary disease) ([facility])    Coronary artery disease    Catheterization November, 2009, 2 of 4 grafts  patent,   Coronary artery disease    Diarrhea    Recurrent C. difficile in the past,  [facility]od   Drug therapy    Prednisone intermittently for anemia   Ejection fraction    EF normal, cath, 2009 /  EF 55-60%, echo, July, 2012, mild septal bounce   GERD (gastroesophageal reflux disease)    Gout    Hemolytic anemia ([facility])    Darovsky, Probable Coombs negative hemolytic anemia, prednisone therapy intermittent   History of prolonged Q-T interval on ECG    Intermittent   Hx of CABG    2002   Mitral regurgitation    Mild   Osteoporosis    PAD (peripheral artery disease) ([facility])    Mild, legs   PVC's (premature ventricular contractions)    Renal carcinoma ([facility])    Treated by freezing by urology, 2012   Shoulder pain    Rotator cuff   Syncope    January, 2010, probable orthostatic and volume  /  he vent recorder may, 2011, normal sinus rhythm   Past Surgical History:  Procedure Laterality Date   ABDOMINAL HYSTERECTOMY     CARDIAC CATHETERIZATION  2009   2/4 occluded grafts most concerning area was the ostieal stenosis of the LM . Both bypass grafts to the anterioro cirulation are occluded. The mida and distal lLAD gets some filling from the RCA.Stenosis of the LM could potentially sompromise flow to the promixmal and midportion of in the  LAD. Intra coronary [place] could be considered should she continue to be symptomatic.  Tx medically for now.   COPD     CORONARY ARTERY BYPASS GRAFT     coronary artery disease      GALLBLADDER SURGERY     KIDNEY SURGERY     RECTAL BIOPSY     SHOULDER SURGERY      Allergies  Allergen Reactions   Codeine     REACTION: hallucinate    Allergies as of 04/23/2020      Reactions   Codeine    REACTION: hallucinate      Medication List       Accurate as of April 23, 2020  3:55 PM. If you have any questions, ask your nurse or doctor.        STOP taking these medications   LACTOBACILLUS PO Stopped by: Nelda Bucks Ngetich,  NP   omeprazole 20 MG capsule Commonly known as: PRILOSEC Stopped by: Sandrea Hughs, NP   predniSONE 5 MG tablet Commonly known as: DELTASONE Stopped by: Sandrea Hughs, NP   SENNA-DOCUSATE SODIUM PO Stopped by: Nelda Bucks Ngetich, NP   sodium chloride 0.65 % Soln nasal spray Commonly known as: OCEAN Stopped by: Sandrea Hughs, NP     TAKE these medications   acetaminophen 325 MG tablet Commonly known as: TYLENOL Take 650 mg by mouth every 6 (six) hours as needed for mild pain or moderate pain.   Albuterol Sulfate 2.5 MG/0.5ML Nebu Inhale 2.5 mg into the lungs every 6 (six) hours as needed.   allopurinol 300 MG tablet Commonly known as: ZYLOPRIM Take 300 mg by mouth daily.   amLODipine 5 MG tablet Commonly known as: NORVASC Take 5 mg by mouth daily.   Anoro Ellipta 62.5-25 MCG/INH Aepb Generic drug: umeclidinium-vilanterol Inhale 1 puff into the lungs daily. Rinse mouth with water and spit after usage.   aspirin EC 81 MG tablet Take 81 mg by mouth 2 (two) times daily. Starting 05/02/2020 take 1 tablet by mouth daily for DVT Prophylaxis   Biofreeze 4 % Gel Generic drug: Menthol (Topical Analgesic) Apply topically 4 (four) times daily as needed.   celecoxib 200 MG capsule Commonly known as: CELEBREX Take 1 capsule by mouth daily.   colchicine 0.6 MG tablet Take 0.6 mg by mouth 2 (two) times daily as needed.   donepezil 10 MG tablet Commonly known as: ARICEPT Take 10 mg by mouth daily.   gabapentin 100 MG capsule Commonly known as: NEURONTIN Take 100 mg by mouth 3 (three) times daily.   levETIRAcetam 500 MG tablet Commonly known as: KEPPRA Take 500 mg by mouth 2 (two) times daily.   LORazepam 0.5 MG tablet Commonly known as: ATIVAN Take 1 tablet (0.5 mg total) by mouth at bedtime.   melatonin 3 MG Tabs tablet Take 3 mg by mouth at bedtime.   metoprolol succinate 25 MG 24 hr tablet Commonly known as: TOPROL-XL Take 25 mg by mouth every morning.    MULTIVITAMIN & MINERAL PO Take 1 tablet by mouth daily.   neomycin-bacitracin-polymyxin [phone] ointment Apply 1 application topically 4 (four) times daily as needed (For Breaking Skin).   nitroGLYCERIN 0.4 MG SL tablet Commonly known as: NITROSTAT Place 1 tablet (0.4 mg total) under the tongue every 5 (five) minutes as needed.   NON FORMULARY Med pass bid d/t weight loss   polyethylene glycol 17 g packet Commonly known as: MIRALAX / GLYCOLAX Take 17 g by mouth daily.  sertraline 50 MG tablet Commonly known as: ZOLOFT Take 50 mg by mouth every morning. For Depression   VITAMIN D3 PO Take 1,000 Units by mouth daily.       Review of Systems  Immunization History  Administered Date(s) Administered   Influenza Split 05/16/2014   Moderna SARS-COVID-2 Vaccination 09/08/2019, 10/08/2019   Pertinent  Health Maintenance Due  Topic Date Due   DEXA SCAN  Never done   PNA vac Low Risk Adult (1 of 2 - PCV13) Never done   INFLUENZA VACCINE  03/16/2020   No flowsheet data found. Functional Status Survey:    Vitals:   04/23/20 1543  BP: 131/65  Pulse: 82  Resp: 18  Temp: (!) 97.1 F (36.2 C)  SpO2: 95%  Weight: 97 lb 6.4 oz (44.2 kg)  Height: 5\' 2"  (1.575 m)   Body mass index is 17.81 kg/m. Physical Exam  Labs reviewed: Recent Labs    03/27/20 0000 03/31/20 0000 04/02/20 0000  NA 133* 131* 128*  K 4.7 5.4* 5.1  CL 99 98* 96*  CO2 21 20 20   BUN 17 15 20   CREATININE 0.8 0.9 1.0  CALCIUM 8.6* 8.6* 9.1   No results for input(s): AST, ALT, ALKPHOS, BILITOT, PROT, ALBUMIN in the last 8760 hours. Recent Labs    04/02/20 0000 04/11/20 0000 04/23/20 0000  WBC 7.5 7.2 6.9  HGB 8.9* 9.2* 8.3*  HCT 28* 30* 27*  PLT 460* 325 329   Lab Results  Component Value Date   TSH 1.31 01/31/2015   Lab Results  Component Value Date   HGBA1C  06/29/2008    5.2 (NOTE)   The ADA recommends the following therapeutic goal for glycemic   control related to Hgb  A1C measurement:   Goal of Therapy:   < 7.0% Hgb A1C   Reference: American Diabetes Association: Clinical Practice   Recommendations 2008, Diabetes Care,  2008, 31:(Suppl 1).   Lab Results  Component Value Date   CHOL  07/22/2008    114        ATP III CLASSIFICATION:  <200     mg/dL   Desirable  200-239  mg/dL   Borderline High  >=240    mg/dL   High   HDL 43 07/22/2008   LDLCALC  07/22/2008    58        Total Cholesterol/HDL:CHD Risk Coronary Heart Disease Risk Table                     Men   Women  1/2 Average Risk   3.4   3.3   TRIG 65 07/22/2008   CHOLHDL 2.7 07/22/2008    Significant Diagnostic Results in last 30 days:  No results found.  Assessment/Plan There are no diagnoses linked to this encounter.   Family/ staff Communication:   Labs/tests ordered:

## 2020-04-24 ENCOUNTER — Emergency Department (HOSPITAL_COMMUNITY): Payer: PPO

## 2020-04-24 DIAGNOSIS — I2510 Atherosclerotic heart disease of native coronary artery without angina pectoris: Secondary | ICD-10-CM | POA: Diagnosis not present

## 2020-04-24 DIAGNOSIS — N189 Chronic kidney disease, unspecified: Secondary | ICD-10-CM | POA: Diagnosis not present

## 2020-04-24 DIAGNOSIS — M170 Bilateral primary osteoarthritis of knee: Secondary | ICD-10-CM | POA: Diagnosis not present

## 2020-04-24 DIAGNOSIS — C7931 Secondary malignant neoplasm of brain: Secondary | ICD-10-CM | POA: Diagnosis not present

## 2020-04-24 DIAGNOSIS — S52502A Unspecified fracture of the lower end of left radius, initial encounter for closed fracture: Secondary | ICD-10-CM | POA: Diagnosis not present

## 2020-04-24 DIAGNOSIS — M6281 Muscle weakness (generalized): Secondary | ICD-10-CM | POA: Diagnosis not present

## 2020-04-24 DIAGNOSIS — C7800 Secondary malignant neoplasm of unspecified lung: Secondary | ICD-10-CM | POA: Diagnosis not present

## 2020-04-24 DIAGNOSIS — Z951 Presence of aortocoronary bypass graft: Secondary | ICD-10-CM | POA: Diagnosis not present

## 2020-04-24 DIAGNOSIS — R2681 Unsteadiness on feet: Secondary | ICD-10-CM | POA: Diagnosis not present

## 2020-04-24 DIAGNOSIS — I509 Heart failure, unspecified: Secondary | ICD-10-CM | POA: Diagnosis not present

## 2020-04-24 DIAGNOSIS — E785 Hyperlipidemia, unspecified: Secondary | ICD-10-CM | POA: Diagnosis not present

## 2020-04-24 DIAGNOSIS — M8588 Other specified disorders of bone density and structure, other site: Secondary | ICD-10-CM | POA: Diagnosis not present

## 2020-04-24 DIAGNOSIS — D649 Anemia, unspecified: Secondary | ICD-10-CM | POA: Diagnosis not present

## 2020-04-24 DIAGNOSIS — I517 Cardiomegaly: Secondary | ICD-10-CM | POA: Diagnosis not present

## 2020-04-24 DIAGNOSIS — Z7189 Other specified counseling: Secondary | ICD-10-CM | POA: Diagnosis not present

## 2020-04-24 DIAGNOSIS — I999 Unspecified disorder of circulatory system: Secondary | ICD-10-CM | POA: Diagnosis not present

## 2020-04-24 DIAGNOSIS — M12812 Other specific arthropathies, not elsewhere classified, left shoulder: Secondary | ICD-10-CM | POA: Diagnosis not present

## 2020-04-24 DIAGNOSIS — M47898 Other spondylosis, sacral and sacrococcygeal region: Secondary | ICD-10-CM | POA: Diagnosis not present

## 2020-04-24 DIAGNOSIS — S52502D Unspecified fracture of the lower end of left radius, subsequent encounter for closed fracture with routine healing: Secondary | ICD-10-CM | POA: Diagnosis not present

## 2020-04-24 DIAGNOSIS — J449 Chronic obstructive pulmonary disease, unspecified: Secondary | ICD-10-CM | POA: Diagnosis not present

## 2020-04-24 DIAGNOSIS — S32512D Fracture of superior rim of left pubis, subsequent encounter for fracture with routine healing: Secondary | ICD-10-CM | POA: Diagnosis not present

## 2020-04-24 DIAGNOSIS — S52602A Unspecified fracture of lower end of left ulna, initial encounter for closed fracture: Secondary | ICD-10-CM | POA: Diagnosis not present

## 2020-04-24 DIAGNOSIS — S32592D Other specified fracture of left pubis, subsequent encounter for fracture with routine healing: Secondary | ICD-10-CM | POA: Diagnosis not present

## 2020-04-24 DIAGNOSIS — D599 Acquired hemolytic anemia, unspecified: Secondary | ICD-10-CM | POA: Diagnosis not present

## 2020-04-24 DIAGNOSIS — R600 Localized edema: Secondary | ICD-10-CM | POA: Diagnosis not present

## 2020-04-24 DIAGNOSIS — Z4789 Encounter for other orthopedic aftercare: Secondary | ICD-10-CM | POA: Diagnosis not present

## 2020-04-24 DIAGNOSIS — M12811 Other specific arthropathies, not elsewhere classified, right shoulder: Secondary | ICD-10-CM | POA: Diagnosis not present

## 2020-04-24 DIAGNOSIS — S52602D Unspecified fracture of lower end of left ulna, subsequent encounter for closed fracture with routine healing: Secondary | ICD-10-CM | POA: Diagnosis not present

## 2020-04-24 DIAGNOSIS — Z515 Encounter for palliative care: Secondary | ICD-10-CM | POA: Diagnosis not present

## 2020-04-24 DIAGNOSIS — C801 Malignant (primary) neoplasm, unspecified: Secondary | ICD-10-CM | POA: Diagnosis not present

## 2020-04-24 DIAGNOSIS — Z9181 History of falling: Secondary | ICD-10-CM | POA: Diagnosis not present

## 2020-04-24 DIAGNOSIS — J9811 Atelectasis: Secondary | ICD-10-CM | POA: Diagnosis not present

## 2020-04-24 DIAGNOSIS — I129 Hypertensive chronic kidney disease with stage 1 through stage 4 chronic kidney disease, or unspecified chronic kidney disease: Secondary | ICD-10-CM | POA: Diagnosis not present

## 2020-04-24 DIAGNOSIS — R2689 Other abnormalities of gait and mobility: Secondary | ICD-10-CM | POA: Diagnosis not present

## 2020-04-24 LAB — CBC
HCT: 28.8 % — ABNORMAL LOW (ref 36.0–46.0)
Hemoglobin: 8.3 g/dL — ABNORMAL LOW (ref 12.0–15.0)
MCH: 25.1 pg — ABNORMAL LOW (ref 26.0–34.0)
MCHC: 28.8 g/dL — ABNORMAL LOW (ref 30.0–36.0)
MCV: 87 fL (ref 80.0–100.0)
Platelets: 287 10*3/uL (ref 150–400)
RBC: 3.31 MIL/uL — ABNORMAL LOW (ref 3.87–5.11)
RDW: 15 % (ref 11.5–15.5)
WBC: 7 10*3/uL (ref 4.0–10.5)
nRBC: 0 % (ref 0.0–0.2)

## 2020-04-24 LAB — BRAIN NATRIURETIC PEPTIDE: B Natriuretic Peptide: 136.9 pg/mL — ABNORMAL HIGH (ref 0.0–100.0)

## 2020-04-24 LAB — TROPONIN I (HIGH SENSITIVITY)
Troponin I (High Sensitivity): 9 ng/L (ref <18)
Troponin I (High Sensitivity): 8 ng/L (ref <18)

## 2020-04-24 LAB — POC OCCULT BLOOD, ED: Fecal Occult Bld: POSITIVE — AB

## 2020-04-24 MED ORDER — FERROUS SULFATE 325 (65 FE) MG PO TABS: 325.0000 mg | ORAL_TABLET | Freq: Every day | ORAL | 1 refills | Status: DC

## 2020-04-24 MED ORDER — PANTOPRAZOLE SODIUM 40 MG IV SOLR
40.0000 mg | Freq: Once | INTRAVENOUS | Status: AC
  Administered 2020-04-24: 40 mg via INTRAVENOUS
  Filled 2020-04-24: qty 40

## 2020-04-24 MED ORDER — OMEPRAZOLE 40 MG PO CPDR: 40.0000 mg | DELAYED_RELEASE_CAPSULE | Freq: Every day | ORAL | 1 refills | Status: DC

## 2020-04-24 NOTE — Discharge Instructions (Signed)
1.  Start taking omeprazole 40 mg as prescribed.  This should be taking daily 30 minutes before you eat anything in the morning. 2.  Stop taking aspirin at this time.  You will need to talk to your treating provider about when it is safe to resume taking aspirin. 3.  Start taking iron supplement as prescribed. 4.  You will need a recheck by the facility physician within the next several days and a follow-up appointment with gastroenterology. 5.  Return if you get any worsening or changing symptoms, especially if you have increasing dark or bloody-looking bowel movement.  Your blood count today is a hemoglobin of 8.3.  This has been rechecked over the course of your stay in the emergency department.  This has not dropped.  Your hemoglobin has been from 8.5-8.1 for the last several weeks.  Typically, blood is not transfused for levels 8 and above if the level is stable and your heart rate and blood pressure are stable.  You are losing blood, likely through your intestines.  Your stool does test positive for blood.  You will need to discontinue your daily aspirin and start taking iron supplement.  You will need your hemoglobins carefully monitored for the next week.  You should have referral to a gastroenterologist, a specialist who deals with bleeding from the intestines.

## 2020-04-24 NOTE — ED Provider Notes (Signed)
[facility]ERGENCY DEPARTMENT Provider Note   CSN: [id number] Arrival date & time: 04/23/20  1645     History Chief Complaint  Patient presents with  . Abnormal Lab    [name] is a 85 y.o. female.  HPI Patient reports she is currently at rehabilitation at [facility].  She is there because she had a fall and broke her left wrist and her pelvis.  She reports that every time she gets up to do physical therapy she is feeling very "swimmy headed".  She reports over the last few days every time she gets up she feels like she might pass out.  She denies that she is having any increasing pain.  She reports she is doing all right with her physical therapy.  She is having some increased swelling in her legs.  She reports often the right swells more than the left.  However they are more swollen today than usual.  She reports she has been eating all right in her opinion.  She denies has been having any vomiting or diarrhea.  She denies abdominal pain or chest pain.  Patient has easy bruising.    Past Medical History:  Diagnosis Date  . Carotid artery disease ([place])    Doppler, January, 2010, mild plaque, no major stenoses  . CKD (chronic kidney disease)    Creatinine 1.4, August, 2012  . Congenital prolapsed rectum   . COPD (chronic obstructive pulmonary disease) ([place])    Home O2, Severe pneumonia 2008  . COPD (chronic obstructive pulmonary disease) ([place])   . Coronary artery disease    Catheterization November, 2009, 2 of 4 grafts patent,  . Coronary artery disease   . 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 Drug therapy    Prednisone intermittently for anemia  . Ejection fraction    EF normal, cath, 2009 /  EF 55-60%, echo, July, 2012, mild septal bounce  . GERD (gastroesophageal reflux disease)   . Gout   . Hemolytic anemia (HCC)    [place], Probable Coombs negative hemolytic anemia, prednisone therapy intermittent  . History of prolonged  Q-T interval on ECG    Intermittent  . Hx of CABG    2002  . Mitral regurgitation    Mild  . Osteoporosis   . PAD (peripheral artery disease) (HCC)    Mild, legs  . PVC's (premature ventricular contractions)   . Renal carcinoma ([place])    Treated by freezing by urology, 2012  . Shoulder pain    Rotator cuff  . Syncope    January, 2010, probable orthostatic and volume  /  he vent recorder may, 2011, normal sinus rhythm    Patient Active Problem List   Diagnosis Date Noted  . Intraparenchymal hemorrhage of brain ([place]) 03/29/2020  . Closed fracture of one rib of right side 03/29/2020  . Closed wedge compression fracture of T6 vertebra ([place]) 03/29/2020  . Metastatic carcinoma involving lung with unknown primary site ([place]) 03/29/2020  . Metastasis to brain of unknown origin ([place]) 03/29/2020  . Left renal mass 03/29/2020  . Closed fracture of ramus of left pubis ([place]) 03/16/2020  . Closed fracture of left distal radius and ulna, initial encounter 03/16/2020  . Radicular pain in left arm 02/01/2019  . Rupture of extensor tendons of right hand and wrist 01/22/2019  . Rectal prolapse 09/12/2018  . COPD II 06/15/2017  . PAD (peripheral artery disease) ([place]) 06/15/2017  .  Carotid artery disease ([place]) 06/15/2017  . CKD (chronic kidney disease) 06/15/2017  . Rotator cuff arthropathy of both shoulders 06/15/2017  . Primary localized osteoarthrosis of left shoulder region 06/15/2017  . Acute pain of left shoulder 06/15/2017  . Dyspnea 01/31/2015  . Upper airway cough syndrome/VCD 01/31/2015  . Renal carcinoma ([place])   . Coronary artery disease   . History of prolonged Q-T interval on ECG   . Hemolytic anemia ([place])   . Drug therapy   . PVC's (premature ventricular contractions)   . Mitral regurgitation   . Ejection fraction   . Hx of CABG   . Syncope   . GERD 01/06/2010    Past Surgical History:  Procedure Laterality Date  . ABDOMINAL HYSTERECTOMY    . CARDIAC CATHETERIZATION   2009   2/4 occluded grafts most concerning area was the ostieal stenosis of the LM . Both bypass grafts to the anterioro cirulation are occluded. The mida and distal lLAD gets some filling from the RCA.Stenosis of the LM could potentially sompromise flow to the promixmal and midportion of in the LAD. Intra coronary [place] could be considered should she continue to be symptomatic.  Tx medically for now.  Marland Kitchen COPD    . CORONARY ARTERY BYPASS GRAFT    . coronary artery disease     . GALLBLADDER SURGERY    . KIDNEY SURGERY    . RECTAL BIOPSY    . SHOULDER SURGERY       OB History   No obstetric history on file.     Family History  Problem Relation Age of Onset  . Stroke Father   . Heart attack Brother   . Heart attack Mother     Social History   Tobacco Use  . Smoking status: Former Smoker    Packs/day: 2.00    Years: 40.00    Pack years: 80.00    Types: Cigarettes    Start date: 08/27/1949    Quit date: 08/16/1997    Years since quitting: 22.7  . Smokeless tobacco: Never Used  Vaping Use  . Vaping Use: Never used  Substance Use Topics  . Alcohol use: Never  . Drug use: No    Home Medications Prior to Admission medications   Medication Sig Start Date End Date Taking? Authorizing Provider  acetaminophen (TYLENOL) 325 MG tablet Take 650 mg by mouth every 6 (six) hours as needed for mild pain or moderate pain.    Yes [provider]  Albuterol Sulfate 2.5 MG/0.5ML NEBU Inhale 2.5 mg into the lungs every 6 (six) hours as needed (wheezing).    Yes [provider]  allopurinol (ZYLOPRIM) 300 MG tablet Take 300 mg by mouth daily.   Yes [provider]  amLODipine (NORVASC) 5 MG tablet Take 5 mg by mouth daily.   Yes [provider]  aspirin EC 81 MG tablet Take 81 mg by mouth 2 (two) times daily. Starting 05/02/2020 take 1 tablet by mouth daily for DVT Prophylaxis 03/21/20 05/01/20 Yes [provider]  celecoxib (CELEBREX) 200 MG capsule Take 1  capsule by mouth daily. 03/10/18  Yes [provider]  Cholecalciferol (VITAMIN D3 PO) Take 1,000 Units by mouth daily.   Yes [provider]  colchicine 0.6 MG tablet Take 0.6 mg by mouth 2 (two) times daily as needed (for Gout).    Yes [provider]  donepezil (ARICEPT) 10 MG tablet Take 10 mg by mouth daily.   Yes [provider]  gabapentin (NEURONTIN) 100 MG capsule Take 100 mg by mouth 3 (three) times daily.   Yes [provider]  levETIRAcetam (KEPPRA) 500 MG tablet Take 500 mg by mouth 2 (two) times daily.   Yes [provider]  LORazepam (ATIVAN) 0.5 MG tablet Take 1 tablet (0.5 mg total) by mouth at bedtime. Patient taking differently: Take 1 mg by mouth at bedtime.  04/23/20  Yes Ngetich, Dinah C, NP  melatonin 3 MG TABS tablet Take 3 mg by mouth at bedtime.   Yes [provider]  Menthol, Topical Analgesic, (BIOFREEZE) 4 % GEL Apply 1 application topically in the morning, at noon, in the evening, and at bedtime. Left shoulder pain   Yes [provider]  metoprolol succinate (TOPROL-XL) 25 MG 24 hr tablet Take 25 mg by mouth every morning.  04/05/16  Yes [provider]  Multiple Vitamins-Minerals (MULTIVITAMIN & MINERAL PO) Take 1 tablet by mouth daily.   Yes [provider]  neomycin-bacitracin-polymyxin (NEOSPORIN) [phone] ointment Apply 1 application topically 4 (four) times daily as needed (For Breaking Skin). 03/21/20  Yes [provider]  nitroGLYCERIN (NITROSTAT) 0.4 MG SL tablet Place 1 tablet (0.4 mg total) under the tongue every 5 (five) minutes as needed. Patient taking differently: Place 0.4 mg under the tongue every 5 (five) minutes as needed for chest pain.  02/15/11  Yes Lendon Colonel, NP  NON FORMULARY Med pass bid d/t weight loss 04/02/20  Yes [provider]  polyethylene glycol (MIRALAX / GLYCOLAX) 17 g packet Take 17 g by mouth daily.   Yes [provider]  predniSONE (DELTASONE) 5 MG tablet Take 5 mg by mouth daily with breakfast.   Yes [provider]  Saccharomyces boulardii (PROBIOTIC) 250 MG CAPS Take 250 mg by mouth daily.   Yes [provider]  sennosides-docusate sodium (SENOKOT-S) 8.6-50 MG tablet Take 2 tablets by mouth in the morning and at bedtime.   Yes [provider]  sertraline (ZOLOFT) 50 MG tablet Take 50 mg by mouth every morning. For Depression   Yes [provider]  sodium chloride (OCEAN) 0.65 % SOLN nasal spray Place 1 spray into both nostrils every 6 (six) hours as needed for congestion.   Yes [provider]  umeclidinium-vilanterol (ANORO ELLIPTA) 62.5-25 MCG/INH AEPB Inhale 1 puff into the lungs daily. Rinse mouth with water and spit after usage.   Yes [provider]  ferrous sulfate 325 (65 FE) MG tablet Take 1 tablet (325 mg total) by mouth daily. 04/24/20   Charlesetta Shanks, MD  omeprazole (PRILOSEC) 40 MG capsule Take 1 capsule (40 mg total) by mouth daily. 04/24/20   Charlesetta Shanks, MD    Allergies    Codeine  Review of Systems   Review of Systems 10 systems reviewed and negative except as per HPI Physical Exam Updated Vital Signs BP 97/72 (BP Location: Right Arm)   Pulse 88   Temp 98.1 F (36.7 C) (Oral)   Resp 20   SpO2 100%   Physical Exam Constitutional:      Comments: Alert and appropriate.  Clear mental status.  No respiratory distress at rest.  HENT:     Head: Normocephalic and atraumatic.     Mouth/Throat:     Pharynx: Oropharynx is clear.  Eyes:     Extraocular Movements: Extraocular movements intact.  Cardiovascular:     Rate and Rhythm: Normal rate and regular rhythm.  Pulmonary:     Comments: No respiratory  distress.  Crackles bilateral bases.  Good airflow otherwise without wheeze or rhonchi. Abdominal:     General: There is no distension.     Palpations: Abdomen is soft.     Tenderness: There is no abdominal  tenderness.  Genitourinary:    Comments: No stool in the vault.  Trace brownish-black mucus. Musculoskeletal:     Cervical back: Neck supple.     Comments: Patient is wearing a cast on her left lower arm.  No significant edema or redness around the cast.  Bilateral lower extremities have 2+ pitting edema from the knees to the feet.  Extensive ecchymotic bruising scattered over the lower extremities and arms.  Hands and feet are warm and dry.  She can move all of her extremities at command.  Patient is able to sit herself up in the bed independently.  Skin:    General: Skin is warm and dry.     Coloration: Skin is pale.  Neurological:     General: No focal deficit present.     Mental Status: She is oriented to person, place, and time.     Coordination: Coordination normal.  Psychiatric:        Mood and Affect: Mood normal.     ED Results / Procedures / Treatments   Labs (all labs ordered are listed, but only abnormal results are displayed) Labs Reviewed  COMPREHENSIVE METABOLIC PANEL - Abnormal; Notable for the following components:      Result Value   Potassium 5.2 (*)    Glucose, Bld 127 (*)    BUN 25 (*)    Creatinine, Ser 1.14 (*)    Total Protein 5.6 (*)    Albumin 3.2 (*)    GFR calc non Af Amer 44 (*)    GFR calc Af Amer 51 (*)    All other components within normal limits  CBC - Abnormal; Notable for the following components:   RBC 3.17 (*)    Hemoglobin 8.1 (*)    HCT 28.0 (*)    MCH 25.6 (*)    MCHC 28.9 (*)    All other components within normal limits  CBC - Abnormal; Notable for the following components:   RBC 3.31 (*)    Hemoglobin 8.3 (*)    HCT 28.8 (*)    MCH 25.1 (*)    MCHC 28.8 (*)    All other components within normal limits  BRAIN NATRIURETIC PEPTIDE - Abnormal; Notable for the following components:   B Natriuretic Peptide 136.9 (*)    All other components within normal limits  POC OCCULT BLOOD, ED - Abnormal; Notable for the following components:     Fecal Occult Bld POSITIVE (*)    All other components within normal limits  URINALYSIS, ROUTINE W REFLEX MICROSCOPIC  TYPE AND SCREEN  TROPONIN I (HIGH SENSITIVITY)  TROPONIN I (HIGH SENSITIVITY)    EKG EKG Interpretation  Date/Time:  Thursday April 24 2020 10:13:57 EDT Ventricular Rate:  79 PR Interval:    QRS Duration: 94 QT Interval:  370 QTC Calculation: 425 R Axis:   93 Text Interpretation: Sinus rhythm Atrial premature complex Anteroseptal infarct, age indeterminate no change from previous Confirmed by Charlesetta Shanks [phone]) on 04/24/2020 1:26:50 PM   Radiology DG Chest Port 1 View  Result Date: 04/24/2020 CLINICAL DATA:  Peripheral edema. EXAM: PORTABLE CHEST 1 VIEW COMPARISON:  Chest x-ray 03/16/2020.  03/23/2018. FINDINGS: Mediastinum is stable. Prior CABG. Stable cardiomegaly. No pulmonary venous congestion. Prominent rounded pulmonary densities noted  in the left mid lung have diminished in size. Ill-defined left suprahilar density noted. This could represent a new infiltrate or post treatment changes. COPD. Mild atelectasis left lung base. No pleural effusion or pneumothorax. No acute bony abnormality. IMPRESSION: 1. Prior CABG. Stable cardiomegaly. No pulmonary venous congestion. 2. Prominent rounded pulmonary densities noted in the left mid lung on chest x-ray of 03/16/2020 have diminished in size ill-defined left suprahilar density noted. This could represent a new infiltrate or post treatment changes. 3.  COPD.  Mild atelectasis left lung base. Electronically Signed   By: Marcello Moores  Register   On: 04/24/2020 10:25    Procedures Procedures (including critical care time)  Medications Ordered in ED Medications  pantoprazole (PROTONIX) injection 40 mg (40 mg Intravenous Given 04/24/20 1100)    ED Course  I have reviewed the triage vital signs and the nursing notes.  Pertinent labs & imaging results that were available during my care of the patient were reviewed by me  and considered in my medical decision making (see chart for details).    MDM Rules/Calculators/A&P                          Patient is here for evaluation of anemia.  Hemoglobin has been stable in the low 8 mg/dL for the last 2 to 3 weeks.  Patient describes getting increased lightheadedness with activity.  She is undergoing physical therapy at this time after having a pelvic fracture and wrist fracture.  Blood pressures and heart rate are stable.  Patient has a positive stool specimen.  There is no gross melena in the vault.  She has trace brownish dark appearing stool present.  Patient has extensive bruising of extremities.  She reports she is taking a daily aspirin.  At this time, she does not meet criteria for transfusion.  Plan will be to hold aspirin, start iron supplement and daily omeprazole 40 mg.  Patient will need close follow-up and supervision with monitoring of CBC and referral to GI.  Return precautions reviewed.  Final Clinical Impression(s) / ED Diagnoses Final diagnoses:  Chronic anemia  Gastrointestinal hemorrhage, unspecified gastrointestinal hemorrhage type  Severe comorbid illness    Rx / DC Orders ED Discharge Orders         Ordered    omeprazole (PRILOSEC) 40 MG capsule  Daily        04/24/20 1325    ferrous sulfate 325 (65 FE) MG tablet  Daily        04/24/20 1325           Charlesetta Shanks, MD 04/24/20 1329

## 2020-04-24 NOTE — ED Notes (Signed)
Pt changed and put in paper scrubs. Her wet pants in bag and iven to the pt. Waiting on ride

## 2020-04-25 ENCOUNTER — Encounter: Payer: Self-pay | Admitting: Internal Medicine

## 2020-04-25 ENCOUNTER — Non-Acute Institutional Stay (SKILLED_NURSING_FACILITY): Payer: PPO | Admitting: Internal Medicine

## 2020-04-25 DIAGNOSIS — C801 Malignant (primary) neoplasm, unspecified: Secondary | ICD-10-CM

## 2020-04-25 DIAGNOSIS — C7931 Secondary malignant neoplasm of brain: Secondary | ICD-10-CM | POA: Diagnosis not present

## 2020-04-25 DIAGNOSIS — C7800 Secondary malignant neoplasm of unspecified lung: Secondary | ICD-10-CM

## 2020-04-25 DIAGNOSIS — Z7189 Other specified counseling: Secondary | ICD-10-CM | POA: Diagnosis not present

## 2020-04-25 DIAGNOSIS — J449 Chronic obstructive pulmonary disease, unspecified: Secondary | ICD-10-CM

## 2020-04-25 DIAGNOSIS — D599 Acquired hemolytic anemia, unspecified: Secondary | ICD-10-CM

## 2020-04-25 LAB — BPAM RBC
Blood Product Expiration Date: 202110082359
Blood Product Expiration Date: 202110082359
ISSUE DATE / TIME: 202109081851
ISSUE DATE / TIME: 202109081915
Unit Type and Rh: 5100
Unit Type and Rh: 5100

## 2020-04-25 LAB — TYPE AND SCREEN
ABO/RH(D): O POS
Antibody Screen: POSITIVE
Unit division: 0
Unit division: 0

## 2020-04-25 NOTE — Progress Notes (Signed)
Location:  [facility] Room Number: 101P Place of Service:  SNF [id number]) Provider:  Paije Goodhart L. Mariea Clonts, D.O., C.M.D.  Monico Blitz, MD  Patient Care Team: Monico Blitz, MD as PCP - General (Internal Medicine) Carlena Bjornstad, MD as Consulting Physician (Cardiology) Raynelle Bring, MD as Consulting Physician (Urology)  Extended Emergency Contact Information Primary Emergency Contact: Whetstone,Barry Address: [phone]          [place], [place] [address] Johnnette Litter of [place] Phone: [phone] Mobile Phone: [phone] Relation: Son Secondary Emergency Contact: Laren Boom [place] of [place] Phone: [phone] Mobile Phone: [phone] Relation: Daughter  Code Status:  DNR Goals of care: Advanced Directive information Advanced Directives 04/25/2020  Does Patient Have a Medical Advance Directive? Yes  Type of Advance Directive Out of facility DNR (pink MOST or yellow form)  Does patient want to make changes to medical advance directive? No - Patient declined  Copy of [place] in Chart? -  Would patient like information on creating a medical advance directive? -  Pre-existing out of facility DNR order (yellow form or pink MOST form) Pink MOST form placed in chart (order not valid for inpatient use)     Chief Complaint  Patient presents with  . Acute Visit    Advance Care Planning     HPI:  Pt is a 85 y.o. female here for rehab s/p hospitalization for pelvic and wrist fxs from a fall but incidentally found to have suspected metastatic renal cell carcinoma  seen today for an acute visit for advance care planning and to f/u after her ED visit for hgb of 8.3.  She's had some chronic bleeding long-term and has historically received some outpatient transfusions.  Her son reports that they had found a place in her colon a long time ago felt to be a source and opted not to biopsy it.  They'd rather  not know for sure about cancer and to manage her symptoms and keep her comfortable.    We discussed the MOST form.  It sounds like Mrs. Buda, her son and daughter are leaning toward comfort care and considering hospice, but Alvester Chou is not the first POA, his sister is, so he's got to discuss with her (his name and number are on the chart b/c he lives locally and she does not). He is still interested possibly in outpatient transfusions for her. He could not talk long so we could not delve into my thoughts that this may not really offer her long-term benefit now.   I did explain that she qualifies for hospice care and some of the benefits of that service and he seemed interested.  He would also like to avoid putting her through going out to the hospital if her care can be managed on site in SNF.  Goals are comfort-based.  There are plans for Mrs. Ryer to move to [place] when she finishes her rehab here after her fall and fractures.    Past Medical History:  Diagnosis Date  . Carotid artery disease ([place])    Doppler, January, 2010, mild plaque, no major stenoses  . CKD (chronic kidney disease)    Creatinine 1.4, August, 2012  . Congenital prolapsed rectum   . COPD (chronic obstructive pulmonary disease) ([place])    Home O2, Severe pneumonia 2008  . COPD (chronic obstructive pulmonary disease) ([place])   . Coronary  artery disease    Catheterization November, 2009, 2 of 4 grafts patent,  . Coronary artery disease   . 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 Drug therapy    Prednisone intermittently for anemia  . Ejection fraction    EF normal, cath, 2009 /  EF 55-60%, echo, July, 2012, mild septal bounce  . GERD (gastroesophageal reflux disease)   . Gout   . Hemolytic anemia (HCC)    Darovsky, Probable Coombs negative hemolytic anemia, prednisone therapy intermittent  . History of prolonged Q-T interval on ECG    Intermittent  . Hx of CABG    2002  . Mitral regurgitation      Mild  . Osteoporosis   . PAD (peripheral artery disease) (HCC)    Mild, legs  . PVC's (premature ventricular contractions)   . Renal carcinoma ([place])    Treated by freezing by urology, 2012  . Shoulder pain    Rotator cuff  . Syncope    January, 2010, probable orthostatic and volume  /  he vent recorder may, 2011, normal sinus rhythm   Past Surgical History:  Procedure Laterality Date  . ABDOMINAL HYSTERECTOMY    . CARDIAC CATHETERIZATION  2009   2/4 occluded grafts most concerning area was the ostieal stenosis of the LM . Both bypass grafts to the anterioro cirulation are occluded. The mida and distal lLAD gets some filling from the RCA.Stenosis of the LM could potentially sompromise flow to the promixmal and midportion of in the LAD. Intra coronary [place] could be considered should she continue to be symptomatic.  Tx medically for now.  Marland Kitchen COPD    . CORONARY ARTERY BYPASS GRAFT    . coronary artery disease     . GALLBLADDER SURGERY    . KIDNEY SURGERY    . RECTAL BIOPSY    . SHOULDER SURGERY      Allergies  Allergen Reactions  . Codeine     REACTION: hallucinate    Outpatient Encounter Medications as of 04/25/2020  Medication Sig  . acetaminophen (TYLENOL) 325 MG tablet Take 650 mg by mouth every 6 (six) hours as needed for mild pain or moderate pain.   . Albuterol Sulfate 2.5 MG/0.5ML NEBU Inhale 2.5 mg into the lungs every 6 (six) hours as needed (wheezing).   Marland Kitchen allopurinol (ZYLOPRIM) 300 MG tablet Take 300 mg by mouth daily.  Marland Kitchen amLODipine (NORVASC) 5 MG tablet Take 5 mg by mouth daily.  Marland Kitchen aspirin EC 81 MG tablet Take 81 mg by mouth 2 (two) times daily. Starting 05/02/2020 take 1 tablet by mouth daily for DVT Prophylaxis  . celecoxib (CELEBREX) 200 MG capsule Take 1 capsule by mouth daily.  . Cholecalciferol (VITAMIN D3 PO) Take 1,000 Units by mouth daily.  . colchicine 0.6 MG tablet Take 0.6 mg by mouth 2 (two) times daily as needed (for Gout).   Marland Kitchen donepezil (ARICEPT) 10 MG  tablet Take 10 mg by mouth daily.  . ferrous sulfate 325 (65 FE) MG tablet Take 1 tablet (325 mg total) by mouth daily.  Marland Kitchen gabapentin (NEURONTIN) 100 MG capsule Take 100 mg by mouth 3 (three) times daily.  Marland Kitchen levETIRAcetam (KEPPRA) 500 MG tablet Take 500 mg by mouth 2 (two) times daily.  Marland Kitchen LORazepam (ATIVAN) 0.5 MG tablet Take 1 tablet (0.5 mg total) by mouth at bedtime.  . melatonin 3 MG TABS tablet Take 3 mg by mouth at bedtime.  . Menthol, Topical  Analgesic, (BIOFREEZE) 4 % GEL Apply 1 application topically in the morning, at noon, in the evening, and at bedtime. Left shoulder pain  . metoprolol succinate (TOPROL-XL) 25 MG 24 hr tablet Take 25 mg by mouth every morning.   . Multiple Vitamins-Minerals (MULTIVITAMIN & MINERAL PO) Take 1 tablet by mouth daily.  Marland Kitchen neomycin-bacitracin-polymyxin (NEOSPORIN) [id number] ointment Apply 1 application topically 4 (four) times daily as needed (For Breaking Skin).  . nitroGLYCERIN (NITROSTAT) 0.4 MG SL tablet Place 1 tablet (0.4 mg total) under the tongue every 5 (five) minutes as needed.  . NON FORMULARY Med pass bid d/t weight loss  . omeprazole (PRILOSEC) 40 MG capsule Take 1 capsule (40 mg total) by mouth daily.  . polyethylene glycol (MIRALAX / GLYCOLAX) 17 g packet Take 17 g by mouth daily.  . predniSONE (DELTASONE) 5 MG tablet Take 5 mg by mouth daily with breakfast.  . Saccharomyces boulardii (PROBIOTIC) 250 MG CAPS Take 250 mg by mouth daily.  . sennosides-docusate sodium (SENOKOT-S) 8.6-50 MG tablet Take 2 tablets by mouth in the morning and at bedtime.  . sertraline (ZOLOFT) 50 MG tablet Take 50 mg by mouth every morning. For Depression  . sodium chloride (OCEAN) 0.65 % SOLN nasal spray Place 1 spray into both nostrils every 6 (six) hours as needed for congestion.  Marland Kitchen umeclidinium-vilanterol (ANORO ELLIPTA) 62.5-25 MCG/INH AEPB Inhale 1 puff into the lungs daily. Rinse mouth with water and spit after usage.   No facility-administered encounter  medications on file as of 04/25/2020.    Review of Systems  Constitutional: Positive for malaise/fatigue and weight loss. Negative for chills and fever.       Gradual weight loss over years, appetite fair by her report  HENT: Positive for hearing loss. Negative for congestion.   Eyes: Negative for blurred vision.  Respiratory: Positive for shortness of breath. Negative for cough, sputum production and wheezing.   Cardiovascular: Negative for chest pain, palpitations and leg swelling.  Gastrointestinal: Positive for constipation. Negative for abdominal pain, blood in stool and diarrhea.  Genitourinary: Negative for dysuria.  Musculoskeletal: Positive for falls.       Wrist and groin pain  Skin: Negative for itching and rash.  Neurological: Positive for dizziness, weakness and headaches. Negative for loss of consciousness.       Not dizzy right at that moment but gets dizzy with positional changes  Endo/Heme/Allergies: Bruises/bleeds easily.  Psychiatric/Behavioral: Positive for memory loss. Negative for depression. The patient is not nervous/anxious and does not have insomnia.        Reports her mind is no longer clear    Immunization History  Administered Date(s) Administered  . Influenza Split 05/16/2014  . Moderna SARS-COVID-2 Vaccination 09/08/2019, 10/08/2019   Pertinent  Health Maintenance Due  Topic Date Due  . DEXA SCAN  Never done  . PNA vac Low Risk Adult (1 of 2 - PCV13) Never done  . INFLUENZA VACCINE  03/16/2020   No flowsheet data found. Functional Status Survey:    Vitals:   04/25/20 1248  BP: 133/79  Pulse: 78  Resp: 18  Temp: 98 F (36.7 C)  SpO2: 92%   There is no height or weight on file to calculate BMI. Physical Exam Vitals reviewed.  Constitutional:      Appearance: Normal appearance.     Comments: Frail female sitting in recliner in her room, wearing O2  HENT:     Head: Normocephalic and atraumatic.     Ears:  Comments: HOH Eyes:      Extraocular Movements: Extraocular movements intact.     Pupils: Pupils are equal, round, and reactive to light.  Cardiovascular:     Rate and Rhythm: Normal rate and regular rhythm.     Pulses: Normal pulses.  Pulmonary:     Effort: Pulmonary effort is normal.     Breath sounds: Rhonchi present. No wheezing or rales.  Abdominal:     General: Bowel sounds are normal.     Palpations: Abdomen is soft.     Tenderness: There is no abdominal tenderness.  Musculoskeletal:        General: Normal range of motion.     Right lower leg: No edema.     Left lower leg: No edema.  Skin:    General: Skin is warm and dry.  Neurological:     General: No focal deficit present.     Mental Status: She is alert.     Motor: Weakness present.     Gait: Gait abnormal.     Comments: Walker and wheelchair use; left wrist in cast and splint  Psychiatric:        Mood and Affect: Mood normal.        Behavior: Behavior normal.     Comments: Very pleasant and appropriate     Labs reviewed: Recent Labs    03/31/20 0000 04/02/20 0000 04/23/20 1747  NA 131* 128* 136  K 5.4* 5.1 5.2*  CL 98* 96* 104  CO2 '20 20 23  ' GLUCOSE  --   --  127*  BUN 15 20 25*  CREATININE 0.9 1.0 1.14*  CALCIUM 8.6* 9.1 9.3   Recent Labs    04/23/20 1747  AST 18  ALT 10  ALKPHOS 71  BILITOT 0.3  PROT 5.6*  ALBUMIN 3.2*   Recent Labs    04/23/20 0000 04/23/20 1747 04/24/20 0952  WBC 6.9 6.9 7.0  HGB 8.3* 8.1* 8.3*  HCT 27* 28.0* 28.8*  MCV  --  88.3 87.0  PLT 329 318 287   Lab Results  Component Value Date   TSH 1.31 01/31/2015   Lab Results  Component Value Date   HGBA1C  06/29/2008    5.2 (NOTE)   The ADA recommends the following therapeutic goal for glycemic   control related to Hgb A1C measurement:   Goal of Therapy:   < 7.0% Hgb A1C   Reference: American Diabetes Association: Clinical Practice   Recommendations 2008, Diabetes Care,  2008, 31:(Suppl 1).   Lab Results  Component Value Date   CHOL   07/22/2008    114        ATP III CLASSIFICATION:  <200     mg/dL   Desirable  200-239  mg/dL   Borderline High  >=240    mg/dL   High   HDL 43 07/22/2008   LDLCALC  07/22/2008    58        Total Cholesterol/HDL:CHD Risk Coronary Heart Disease Risk Table                     Men   Women  1/2 Average Risk   3.4   3.3   TRIG 65 07/22/2008   CHOLHDL 2.7 07/22/2008    Significant Diagnostic Results in last 30 days:  DG Chest Port 1 View  Result Date: 04/24/2020 CLINICAL DATA:  Peripheral edema. EXAM: PORTABLE CHEST 1 VIEW COMPARISON:  Chest x-ray 03/16/2020.  03/23/2018. FINDINGS: Mediastinum is stable.  Prior CABG. Stable cardiomegaly. No pulmonary venous congestion. Prominent rounded pulmonary densities noted in the left mid lung have diminished in size. Ill-defined left suprahilar density noted. This could represent a new infiltrate or post treatment changes. COPD. Mild atelectasis left lung base. No pleural effusion or pneumothorax. No acute bony abnormality. IMPRESSION: 1. Prior CABG. Stable cardiomegaly. No pulmonary venous congestion. 2. Prominent rounded pulmonary densities noted in the left mid lung on chest x-ray of 03/16/2020 have diminished in size ill-defined left suprahilar density noted. This could represent a new infiltrate or post treatment changes. 3.  COPD.  Mild atelectasis left lung base. Electronically Signed   By: Marcello Moores  Register   On: 04/24/2020 10:25    Assessment/Plan 1. ACP (advance care planning) -spoke with pt and her son today -discussed DNR (already in place), recommendation of avoiding hospitalizations, completing MOST form--copy of form left for Alvester Chou and his sister to review (blank emailed also to him)   -he was coming by later in the day - Do not attempt resuscitation (DNR)  2. Metastatic carcinoma involving lung with unknown primary site, unspecified laterality ([place]) -continue oxygen therapy, normal COPD mgt  -goals are comfort based--they have elected  not to seek any treatment for the suspected renal cell ca -recommend hospice care for her which could be done here or at [facility] if needs can be met there  3. Metastatic cancer to brain of unknown cell type [facility]) -she is requesting her children to help with decisions b/c she is not clearheaded now due to this and she's expressed her wishes to them -recommend hospice care -may need decadron added for headaches  4. Acquired hemolytic anemia (HCC) -has historically gotten some transfusions -recommend that this be ceased and we stop checking so often b/c of her underlying malignancy and COPD warranting more symptomatic mgt and comfor-based approaches -transfusions may temporarily prolong life and help with dyspnea, but will be needed recurrently in view of the cancer   5. COPD II -cont oxygen therapy, prednisone, ativan, anoro  Family/ staff Communication: spent 16 minutes by phone with Alvester Chou and another 10 minutes in discussion with Mrs. Davidson (26 mins on acp today)  Labs/tests ordered:  No new  Yobani Schertzer L. Joh Rao, D.O. [facility] [address], [place] [address] Cell Phone (Mon-Fri 8am-5pm):  [phone] On Call:  [phone] & follow prompts after 5pm & weekends Office Phone:  [phone] Office Fax:  [phone]

## 2020-04-29 ENCOUNTER — Encounter: Payer: Self-pay | Admitting: Internal Medicine

## 2020-04-30 ENCOUNTER — Telehealth: Payer: Self-pay | Admitting: Internal Medicine

## 2020-04-30 ENCOUNTER — Non-Acute Institutional Stay: Payer: Medicare HMO | Admitting: Internal Medicine

## 2020-04-30 DIAGNOSIS — Z515 Encounter for palliative care: Secondary | ICD-10-CM

## 2020-04-30 DIAGNOSIS — M12811 Other specific arthropathies, not elsewhere classified, right shoulder: Secondary | ICD-10-CM | POA: Diagnosis not present

## 2020-04-30 DIAGNOSIS — C801 Malignant (primary) neoplasm, unspecified: Secondary | ICD-10-CM

## 2020-04-30 DIAGNOSIS — M12812 Other specific arthropathies, not elsewhere classified, left shoulder: Secondary | ICD-10-CM | POA: Diagnosis not present

## 2020-04-30 NOTE — Telephone Encounter (Signed)
Phoned pt's son [name] and discussed overall update on pt's health status and future plans.  He  Deferred any decisions to his sister, [name], whom he states is pt's POA.     Telephone call to daughter, [name], for discussion of palliative and potential transition to hospice care for patient.  She states that the primary goal is for comfort without CPR or intubation.  Pt would still desire use of antibiotics and IV fluids if necessary.  There is some question as to whether or not pt. Would desire blood transfusions if a decrease of her hgb warranted this.  No decision made related to tube feedings.  After discussion with Dr. Gildardo Cranker, it is noted that blood transfusion would be outside of the scope of Hospice care and may be a financial responsibility of the patient at that time.  This provider explained information related to future potential need for blood transfusions.  She states that she would prefer that [name] is evaluated for a potential Hospice admission after she transfers out of skilled care and to assisted living. She would like this plan to be in place currently.  This provider will communicate desire to [place] for discharge planning.

## 2020-05-06 DIAGNOSIS — Z4789 Encounter for other orthopedic aftercare: Secondary | ICD-10-CM | POA: Diagnosis not present

## 2020-05-06 DIAGNOSIS — S32592D Other specified fracture of left pubis, subsequent encounter for fracture with routine healing: Secondary | ICD-10-CM | POA: Diagnosis not present

## 2020-05-06 DIAGNOSIS — S52502D Unspecified fracture of the lower end of left radius, subsequent encounter for closed fracture with routine healing: Secondary | ICD-10-CM | POA: Diagnosis not present

## 2020-05-06 DIAGNOSIS — M47898 Other spondylosis, sacral and sacrococcygeal region: Secondary | ICD-10-CM | POA: Diagnosis not present

## 2020-05-06 DIAGNOSIS — S32512D Fracture of superior rim of left pubis, subsequent encounter for fracture with routine healing: Secondary | ICD-10-CM | POA: Diagnosis not present

## 2020-05-06 DIAGNOSIS — S52602A Unspecified fracture of lower end of left ulna, initial encounter for closed fracture: Secondary | ICD-10-CM | POA: Diagnosis not present

## 2020-05-06 DIAGNOSIS — I999 Unspecified disorder of circulatory system: Secondary | ICD-10-CM | POA: Diagnosis not present

## 2020-05-06 DIAGNOSIS — S52602D Unspecified fracture of lower end of left ulna, subsequent encounter for closed fracture with routine healing: Secondary | ICD-10-CM | POA: Diagnosis not present

## 2020-05-06 DIAGNOSIS — M8588 Other specified disorders of bone density and structure, other site: Secondary | ICD-10-CM | POA: Diagnosis not present

## 2020-05-06 DIAGNOSIS — M170 Bilateral primary osteoarthritis of knee: Secondary | ICD-10-CM | POA: Diagnosis not present

## 2020-05-06 DIAGNOSIS — S52502A Unspecified fracture of the lower end of left radius, initial encounter for closed fracture: Secondary | ICD-10-CM | POA: Diagnosis not present

## 2020-05-06 NOTE — Progress Notes (Signed)
[facility] Consult Note Telephone: [phone]  Fax: [phone]  PATIENT NAME: [name] DOB: 1935-02-22 MRN: [id number]  PRIMARY CARE PROVIDER:   Monico Blitz, MD  REFERRING PROVIDER:  Dr. Hollace Kinnier  RESPONSIBLE PARTY:   Self and  Ericka Pontiff Daughter/POA [phone]  Nicollet Son [phone]     RECOMMENDATIONS and PLAN:  Palliative care encounter  Z51.5   1. Advance care planning:   DNR on file.  MOST form reviewed with daughter via phone and pt in person.  Delegations are for limited additional interventions but focus on comfort.  Avoid transfer to hospital, antibiotics if indicated and no tube feedings.  DNR form uploaded to Epic. Patient is eligible for transition to Hospice care due to limited prognosis (discussion with Dr. Aletta Edouard) as there are no plans for treatment of suspected metastatic disease.  Daughter prefers Hospice admission upon discharge to [facility].  Palliative care will follow-up with patient until admission to Hospice.   2.  Weakness:  No changes .Multifactorial(chronic anemia, fall, brain and pulmonary lesions).  Use of additional blood transfusions in the future is to be determined per patient and family desire..  Supportive care as needed.   3.  Shortness of breath:  At baseline.  Continue use of supplemental O2.   I spent 30 minutes providing this consultation,  from 1600 to 1630. More than 50% of the time in this consultation was spent coordinating communication with patient and clinical staff.   HISTORY OF PRESENT ILLNESS: Follow-up with  [name].  Pt recently visited ER due to anemia.  No blood transfusions occurred.  Pt states that she is now no longer interested in returning to the hospital potentially.  She states that she would only want a blood transfusion if it is still beneficial otherwise, she would not continue to receive them.  Her plans are to continue PT  while at the SNF and still be transferred to [facility] in the very near future.  She again states that she does not desire any further testing or treatment of lung or brain cancer.   She wants to focus on quality of life for the remainder of her time.   CODE STATUS: DNAR/DNI  PPS: 40% weak with use of walker HOSPICE ELIGIBILITY/DIAGNOSIS: YES/ suspected metastatic lung and brain cancer     Currently receiving PT.  Desired when she admits to AL.  PAST MEDICAL HISTORY:  Past Medical History:  Diagnosis Date  . Carotid artery disease ([place])    Doppler, January, 2010, mild plaque, no major stenoses  . CKD (chronic kidney disease)    Creatinine 1.4, August, 2012  . Congenital prolapsed rectum   . COPD (chronic obstructive pulmonary disease) ([place])    Home O2, Severe pneumonia 2008  . COPD (chronic obstructive pulmonary disease) ([place])   . Coronary artery disease    Catheterization November, 2009, 2 of 4 grafts patent,  . Coronary artery disease   . 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 Drug therapy    Prednisone intermittently for anemia  . Ejection fraction    EF normal, cath, 2009 /  EF 55-60%, echo, July, 2012, mild septal bounce  . GERD (gastroesophageal reflux disease)   . Gout   . Hemolytic anemia (HCC)    Darovsky, Probable Coombs negative hemolytic anemia, prednisone therapy intermittent  . History of prolonged Q-T interval on ECG  Intermittent  . Hx of CABG    2002  . Mitral regurgitation    Mild  . Osteoporosis   . PAD (peripheral artery disease) (HCC)    Mild, legs  . PVC's (premature ventricular contractions)   . Renal carcinoma ([place])    Treated by freezing by urology, 2012  . Shoulder pain    Rotator cuff  . Syncope    January, 2010, probable orthostatic and volume  /  he vent recorder may, 2011, normal sinus rhythm     PERTINENT MEDICATIONS:  Outpatient Encounter Medications as of 03/31/2020  Medication Sig  . acetaminophen  (TYLENOL) 325 MG tablet Take 650 mg by mouth every 6 (six) hours as needed for mild pain or moderate pain.   Marland Kitchen allopurinol (ZYLOPRIM) 300 MG tablet Take 300 mg by mouth daily.  Marland Kitchen aspirin EC 81 MG tablet Take 81 mg by mouth daily.  . celecoxib (CELEBREX) 200 MG capsule Take 1 capsule by mouth daily.  . colchicine 0.6 MG tablet Take 0.6 mg by mouth daily.  Marland Kitchen donepezil (ARICEPT) 10 MG tablet Take 10 mg by mouth daily.  Marland Kitchen gabapentin (NEURONTIN) 100 MG capsule Take 100 mg by mouth 3 (three) times daily.  Marland Kitchen levETIRAcetam (KEPPRA) 500 MG tablet Take 500 mg by mouth 2 (two) times daily.  Marland Kitchen LORazepam (ATIVAN) 0.5 MG tablet Take 0.5 mg by mouth 2 (two) times daily as needed for anxiety.  . metoprolol succinate (TOPROL-XL) 25 MG 24 hr tablet Take 25 mg by mouth every morning.   . nitroGLYCERIN (NITROSTAT) 0.4 MG SL tablet Place 1 tablet (0.4 mg total) under the tongue every 5 (five) minutes as needed.  . OXYGEN Inhale 2 L into the lungs at bedtime.  . predniSONE (DELTASONE) 5 MG tablet Take 1 tablet by mouth daily.  . sertraline (ZOLOFT) 50 MG tablet Take 50 mg by mouth every morning.    No facility-administered encounter medications on file as of 03/31/2020.    PHYSICAL EXAM:   General: NAD, frail appearing, thin elderly female sitting in recliner ENT:  Very hard of hearing Cardiovascular: regular rate and rhythm Pulmonary: clear ant fields.  O2 via [place] in use Abdomen: soft, nontender, + bowel sounds Extremities: no edema Skin: LUE in cast Neurological: A&O x3, weakness Psych:  Pleasant mood, cooperative  Gonzella Lex, NP-C

## 2020-05-10 DIAGNOSIS — R2689 Other abnormalities of gait and mobility: Secondary | ICD-10-CM | POA: Diagnosis not present

## 2020-05-10 DIAGNOSIS — M6281 Muscle weakness (generalized): Secondary | ICD-10-CM | POA: Diagnosis not present

## 2020-05-10 DIAGNOSIS — R2681 Unsteadiness on feet: Secondary | ICD-10-CM | POA: Diagnosis not present

## 2020-05-10 DIAGNOSIS — S52602D Unspecified fracture of lower end of left ulna, subsequent encounter for closed fracture with routine healing: Secondary | ICD-10-CM | POA: Diagnosis not present

## 2020-05-12 ENCOUNTER — Non-Acute Institutional Stay (SKILLED_NURSING_FACILITY): Payer: PPO | Admitting: Family

## 2020-05-12 ENCOUNTER — Encounter: Payer: Self-pay | Admitting: Family

## 2020-05-12 DIAGNOSIS — F411 Generalized anxiety disorder: Secondary | ICD-10-CM

## 2020-05-12 DIAGNOSIS — C7800 Secondary malignant neoplasm of unspecified lung: Secondary | ICD-10-CM | POA: Diagnosis not present

## 2020-05-12 DIAGNOSIS — J449 Chronic obstructive pulmonary disease, unspecified: Secondary | ICD-10-CM

## 2020-05-12 DIAGNOSIS — R2681 Unsteadiness on feet: Secondary | ICD-10-CM

## 2020-05-12 DIAGNOSIS — G4700 Insomnia, unspecified: Secondary | ICD-10-CM | POA: Diagnosis not present

## 2020-05-12 DIAGNOSIS — C7931 Secondary malignant neoplasm of brain: Secondary | ICD-10-CM

## 2020-05-12 DIAGNOSIS — G301 Alzheimer's disease with late onset: Secondary | ICD-10-CM | POA: Diagnosis not present

## 2020-05-12 DIAGNOSIS — S52502D Unspecified fracture of the lower end of left radius, subsequent encounter for closed fracture with routine healing: Secondary | ICD-10-CM

## 2020-05-12 DIAGNOSIS — C801 Malignant (primary) neoplasm, unspecified: Secondary | ICD-10-CM

## 2020-05-12 DIAGNOSIS — K219 Gastro-esophageal reflux disease without esophagitis: Secondary | ICD-10-CM

## 2020-05-12 DIAGNOSIS — I10 Essential (primary) hypertension: Secondary | ICD-10-CM | POA: Diagnosis not present

## 2020-05-12 DIAGNOSIS — M792 Neuralgia and neuritis, unspecified: Secondary | ICD-10-CM | POA: Diagnosis not present

## 2020-05-12 DIAGNOSIS — D599 Acquired hemolytic anemia, unspecified: Secondary | ICD-10-CM | POA: Diagnosis not present

## 2020-05-12 DIAGNOSIS — F0280 Dementia in other diseases classified elsewhere without behavioral disturbance: Secondary | ICD-10-CM

## 2020-05-12 DIAGNOSIS — S52602D Unspecified fracture of lower end of left ulna, subsequent encounter for closed fracture with routine healing: Secondary | ICD-10-CM

## 2020-05-12 MED ORDER — GABAPENTIN 100 MG PO CAPS: 100.0000 mg | ORAL_CAPSULE | Freq: Three times a day (TID) | ORAL | 0 refills | Status: AC

## 2020-05-12 MED ORDER — SERTRALINE HCL 50 MG PO TABS: 50.0000 mg | ORAL_TABLET | ORAL | 0 refills | Status: AC

## 2020-05-12 MED ORDER — TRIPLE ANTIBIOTIC 5-400-5000 EX OINT: 1.0000 "application " | TOPICAL_OINTMENT | Freq: Four times a day (QID) | CUTANEOUS | 0 refills | Status: AC | PRN

## 2020-05-12 MED ORDER — LEVETIRACETAM 500 MG PO TABS: 500.0000 mg | ORAL_TABLET | Freq: Two times a day (BID) | ORAL | 0 refills | Status: AC

## 2020-05-12 MED ORDER — ALLOPURINOL 300 MG PO TABS: 300.0000 mg | ORAL_TABLET | Freq: Every day | ORAL | 0 refills | Status: AC

## 2020-05-12 MED ORDER — AMLODIPINE BESYLATE 5 MG PO TABS: 5.0000 mg | ORAL_TABLET | Freq: Every day | ORAL | 0 refills | Status: AC

## 2020-05-12 MED ORDER — FERROUS SULFATE 325 (65 FE) MG PO TABS: 325.0000 mg | ORAL_TABLET | Freq: Every day | ORAL | 0 refills | Status: AC

## 2020-05-12 MED ORDER — DONEPEZIL HCL 10 MG PO TABS: 10.0000 mg | ORAL_TABLET | Freq: Every day | ORAL | 0 refills | Status: AC

## 2020-05-12 MED ORDER — METOPROLOL SUCCINATE ER 25 MG PO TB24: 25.0000 mg | ORAL_TABLET | ORAL | 0 refills | Status: AC

## 2020-05-12 MED ORDER — PROBIOTIC 250 MG PO CAPS: 250.0000 mg | ORAL_CAPSULE | Freq: Every day | ORAL | 0 refills | Status: AC

## 2020-05-12 MED ORDER — COLCHICINE 0.6 MG PO TABS: 0.6000 mg | ORAL_TABLET | Freq: Two times a day (BID) | ORAL | 0 refills | Status: AC | PRN

## 2020-05-12 MED ORDER — SENNA-DOCUSATE SODIUM 8.6-50 MG PO TABS: 2.0000 | ORAL_TABLET | Freq: Two times a day (BID) | ORAL | 0 refills | Status: AC

## 2020-05-12 MED ORDER — LORAZEPAM 1 MG PO TABS: 1.0000 mg | ORAL_TABLET | Freq: Every day | ORAL | 0 refills | Status: AC

## 2020-05-12 MED ORDER — OMEPRAZOLE 40 MG PO CPDR: 40.0000 mg | DELAYED_RELEASE_CAPSULE | Freq: Every day | ORAL | 0 refills | Status: DC

## 2020-05-12 MED ORDER — PREDNISONE 5 MG PO TABS: 5.0000 mg | ORAL_TABLET | Freq: Every day | ORAL | 0 refills | Status: AC

## 2020-05-12 MED ORDER — ALBUTEROL SULFATE 2.5 MG/0.5ML IN NEBU: 2.5000 mg | INHALATION_SOLUTION | Freq: Four times a day (QID) | RESPIRATORY_TRACT | 0 refills | Status: AC | PRN

## 2020-05-12 MED ORDER — SALINE SPRAY 0.65 % NA SOLN: 1.0000 | Freq: Four times a day (QID) | NASAL | 0 refills | Status: AC | PRN

## 2020-05-12 MED ORDER — ANORO ELLIPTA 62.5-25 MCG/INH IN AEPB
1.0000 | INHALATION_SPRAY | Freq: Every day | RESPIRATORY_TRACT | 0 refills | Status: AC
Start: 2020-05-12 — End: ?

## 2020-05-12 MED ORDER — MELATONIN 3 MG PO TABS: 3.0000 mg | ORAL_TABLET | Freq: Every day | ORAL | 0 refills | Status: AC

## 2020-05-12 MED ORDER — CELECOXIB 200 MG PO CAPS: 200.0000 mg | ORAL_CAPSULE | Freq: Every day | ORAL | 0 refills | Status: AC

## 2020-05-12 MED ORDER — OMEPRAZOLE 40 MG PO CPDR: 40.0000 mg | DELAYED_RELEASE_CAPSULE | Freq: Every day | ORAL | 0 refills | Status: AC

## 2020-05-12 MED ORDER — BIOFREEZE 4 % EX GEL: 1.0000 "application " | Freq: Four times a day (QID) | CUTANEOUS | 0 refills | Status: AC

## 2020-05-12 NOTE — Progress Notes (Signed)
Location:  [facility] Room Number: 101-P Place of Service:  SNF [phone])  Provider: Marlowe Sax FNP-C   PCP: Monico Blitz, MD Patient Care Team: Monico Blitz, MD as PCP - General (Internal Medicine) Carlena Bjornstad, MD as Consulting Physician (Cardiology) Raynelle Bring, MD as Consulting Physician (Urology)  Extended Emergency Contact Information Primary Emergency Contact: Levey,Barry Address: [phone]          [place], [place] [address] Johnnette Litter of [place] Phone: [phone] Mobile Phone: [phone] Relation: Son Secondary Emergency Contact: Laren Boom [place] of [place] Phone: [phone] Mobile Phone: [phone] Relation: Daughter  Code Status: DNR Goals of care:  Advanced Directive information Advanced Directives 05/12/2020  Does Patient Have a Medical Advance Directive? Yes  Type of Advance Directive Out of facility DNR (pink MOST or yellow form)  Does patient want to make changes to medical advance directive? No - Patient declined  Copy of [place] in Chart? -  Would patient like information on creating a medical advance directive? -  Pre-existing out of facility DNR order (yellow form or pink MOST form) Pink MOST form placed in chart (order not valid for inpatient use)     Allergies  Allergen Reactions  . Codeine     REACTION: hallucinate    Chief Complaint  Patient presents with  . Discharge Note    Discharge to [facility] 05/12/2020.    HPI:  85 y.o. female seen today at [facility] for discharge to [facility].She has a medical history of Hypertension,Congestive heart Failure,Hyperlipidemia,COPD,CAD,CKD,Alzheimer's disease among other conditions.She was here for short term rehabilitation for post hospital admission at [facility] from 03/16/2020 - 03/21/2020 for a small intraparenchymal  hematoma and left distal radius fracture which was splinted at another hospital prior to transfer to [facility] after a fall at home when walking to the bathroom.she complained of left arm and left leg pain and was found to have left humeral head projection over the left acromion suggestive of dislocation.Also had a minimally displaced fracture of the left pubic body.Osteopenia also noted.Had an age-indeterminate nondisplaced anterior right sixth rib fracture.Also age indeterminate compression deformity at [facility] had multiple degenerative changes of the thoracolumbar spine and levoscoliosis of the lumbar spine L3-4 calcified a left radius fracture she was admitted to trauma floor by [facility] with spine precaution ,elevation of bed < 30 degrees  And C-collar. She was found to have diffuse metastatic disease including multiple lesion of the brain ,bilateral solid pulmonary masses and nodules,cystic on bilateral renal lesion too small to characterize and had high density lesion in the interpolar left kidney previous MRI indicated cavernous hemangioma.Patient did not wish to pursue biopsy or treatment of lesion.she was discharge to SNF for rehab.While here in Rehab her she complained of dizziness with her Hgb 8.3 was send to ED 04/23/2020 for evaluation.she had dark positive stool but did not meet criteria for transfusion.she was started on iron supplement and daily Omeprazole 40 mg capsule daily.recheck CBC and follow up with GI.Her son reported that they had found a place in her colon a long time ago felt to be a source of bleeding but opted out not to biopsy it just manage her symptoms and keep her comfortable. MOST form was completed here during rehab with Dr.Reed Palliative care ordered.will need Hospice consult at [facility].  She has worked well with PT/OT now stable for discharge.She will be discharged [facility] with Home health PT/OT to continue with ROM, Exercise, Gait  stability and muscle strengthening. She does not require any new  DME states has own walker and wheelchair. Home health services will be arranged by facility social worker prior to discharge. Prescription medication will be written x 1 month then patient to follow up with PCP in 1-2 weeks.she denies any acute issues this visit. Facility staff report no new concerns.she continue to follow up with Orthopedic at [facility]  PA-C last seen 05/06/2020 left hand cast removed.Removable wrist splint ordered to wear daily with activities my remove at rest and grooming.X-ray of pelvis indicated healing sacral Ala and pubic ramus fracture WBAT.she was advised to follow up in 4 weeks.     Past Medical History:  Diagnosis Date  . Carotid artery disease ([place])    Doppler, January, 2010, mild plaque, no major stenoses  . CKD (chronic kidney disease)    Creatinine 1.4, August, 2012  . Congenital prolapsed rectum   . COPD (chronic obstructive pulmonary disease) ([place])    Home O2, Severe pneumonia 2008  . COPD (chronic obstructive pulmonary disease) ([place])   . Coronary artery disease    Catheterization November, 2009, 2 of 4 grafts patent,  . Coronary artery disease   . 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 Drug therapy    Prednisone intermittently for anemia  . Ejection fraction    EF normal, cath, 2009 /  EF 55-60%, echo, July, 2012, mild septal bounce  . GERD (gastroesophageal reflux disease)   . Gout   . Hemolytic anemia (HCC)    Darovsky, Probable Coombs negative hemolytic anemia, prednisone therapy intermittent  . History of prolonged Q-T interval on ECG    Intermittent  . Hx of CABG    2002  . Mitral regurgitation    Mild  . Osteoporosis   . PAD (peripheral artery disease) (HCC)    Mild, legs  . PVC's (premature ventricular contractions)   . Renal carcinoma ([place])    Treated by freezing by urology, 2012  . Shoulder pain    Rotator cuff  . Syncope    January, 2010,  probable orthostatic and volume  /  he vent recorder may, 2011, normal sinus rhythm    Past Surgical History:  Procedure Laterality Date  . ABDOMINAL HYSTERECTOMY    . CARDIAC CATHETERIZATION  2009   2/4 occluded grafts most concerning area was the ostieal stenosis of the LM . Both bypass grafts to the anterioro cirulation are occluded. The mida and distal lLAD gets some filling from the RCA.Stenosis of the LM could potentially sompromise flow to the promixmal and midportion of in the LAD. Intra coronary [place] could be considered should she continue to be symptomatic.  Tx medically for now.  Marland Kitchen COPD    . CORONARY ARTERY BYPASS GRAFT    . coronary artery disease     . GALLBLADDER SURGERY    . KIDNEY SURGERY    . RECTAL BIOPSY    . SHOULDER SURGERY        reports that she quit smoking about 22 years ago. Her smoking use included cigarettes. She started smoking about 68 years ago. She has a 80.00 pack-year smoking history. She has never used smokeless tobacco. She reports that she does not drink alcohol and does not use drugs. Social History  Socioeconomic History  . Marital status: Widowed    Spouse name: Not on file  . Number of children: Not on file  . Years of education: Not on file  . Highest education level: Not on file  Occupational History  . Not on file  Tobacco Use  . Smoking status: Former Smoker    Packs/day: 2.00    Years: 40.00    Pack years: 80.00    Types: Cigarettes    Start date: 08/27/1949    Quit date: 08/16/1997    Years since quitting: 22.7  . Smokeless tobacco: Never Used  Vaping Use  . Vaping Use: Never used  Substance and Sexual Activity  . Alcohol use: Never  . Drug use: No  . Sexual activity: Not Currently  Other Topics Concern  . Not on file  Social History Narrative  . Not on file   Social Determinants of Health   Financial Resource Strain:   . Difficulty of Paying Living Expenses: Not on file  Food Insecurity:   . Worried About Sales executive in the Last Year: Not on file  . Ran Out of Food in the Last Year: Not on file  Transportation Needs:   . Lack of Transportation (Medical): Not on file  . Lack of Transportation (Non-Medical): Not on file  Physical Activity:   . Days of Exercise per Week: Not on file  . Minutes of Exercise per Session: Not on file  Stress:   . Feeling of Stress : Not on file  Social Connections:   . Frequency of Communication with Friends and Family: Not on file  . Frequency of Social Gatherings with Friends and Family: Not on file  . Attends Religious Services: Not on file  . Active Member of Clubs or Organizations: Not on file  . Attends Archivist Meetings: Not on file  . Marital Status: Not on file  Intimate Partner Violence:   . Fear of Current or Ex-Partner: Not on file  . Emotionally Abused: Not on file  . Physically Abused: Not on file  . Sexually Abused: Not on file   Functional Status Survey:    Allergies  Allergen Reactions  . Codeine     REACTION: hallucinate    Pertinent  Health Maintenance Due  Topic Date Due  . DEXA SCAN  Never done  . PNA vac Low Risk Adult (1 of 2 - PCV13) Never done  . INFLUENZA VACCINE  03/16/2020    Medications: Outpatient Encounter Medications as of 05/12/2020  Medication Sig  . acetaminophen (TYLENOL) 325 MG tablet Take 650 mg by mouth every 6 (six) hours as needed for mild pain or moderate pain.   . Albuterol Sulfate 2.5 MG/0.5ML NEBU Inhale 0.5 mLs (2.5 mg total) into the lungs every 6 (six) hours as needed (wheezing).  Marland Kitchen allopurinol (ZYLOPRIM) 300 MG tablet Take 1 tablet (300 mg total) by mouth daily.  Marland Kitchen amLODipine (NORVASC) 5 MG tablet Take 1 tablet (5 mg total) by mouth daily.  Marland Kitchen aspirin EC 81 MG tablet Take 81 mg by mouth daily. Swallow whole.  . celecoxib (CELEBREX) 200 MG capsule Take 1 capsule (200 mg total) by mouth daily.  . Cholecalciferol (VITAMIN D3 PO) Take 1,000 Units by mouth daily.  . colchicine 0.6 MG tablet Take  1 tablet (0.6 mg total) by mouth 2 (two) times daily as needed (for Gout).  Marland Kitchen donepezil (ARICEPT) 10 MG tablet Take 1 tablet (10 mg total) by mouth daily.  Marland Kitchen  ferrous sulfate 325 (65 FE) MG tablet Take 1 tablet (325 mg total) by mouth daily.  Marland Kitchen gabapentin (NEURONTIN) 100 MG capsule Take 1 capsule (100 mg total) by mouth 3 (three) times daily.  Marland Kitchen levETIRAcetam (KEPPRA) 500 MG tablet Take 1 tablet (500 mg total) by mouth 2 (two) times daily.  Marland Kitchen LORazepam (ATIVAN) 1 MG tablet Take 1 tablet (1 mg total) by mouth at bedtime.  . melatonin 3 MG TABS tablet Take 1 tablet (3 mg total) by mouth at bedtime.  . Menthol, Topical Analgesic, (BIOFREEZE) 4 % GEL Apply 1 application topically in the morning, at noon, in the evening, and at bedtime. Left shoulder pain  . metoprolol succinate (TOPROL-XL) 25 MG 24 hr tablet Take 1 tablet (25 mg total) by mouth every morning.  . Multiple Vitamins-Minerals (MULTIVITAMIN & MINERAL PO) Take 1 tablet by mouth daily.  Marland Kitchen neomycin-bacitracin-polymyxin (NEOSPORIN) [phone] ointment Apply 1 application topically 4 (four) times daily as needed (For Breaking Skin).  . nitroGLYCERIN (NITROSTAT) 0.4 MG SL tablet Place 1 tablet (0.4 mg total) under the tongue every 5 (five) minutes as needed.  . NON FORMULARY Med pass bid d/t weight loss  . omeprazole (PRILOSEC) 40 MG capsule Take 1 capsule (40 mg total) by mouth daily.  . polyethylene glycol (MIRALAX / GLYCOLAX) 17 g packet Take 17 g by mouth daily.  . predniSONE (DELTASONE) 5 MG tablet Take 1 tablet (5 mg total) by mouth daily with breakfast.  . Saccharomyces boulardii (PROBIOTIC) 250 MG CAPS Take 250 mg by mouth daily.  . sennosides-docusate sodium (SENOKOT-S) 8.6-50 MG tablet Take 2 tablets by mouth in the morning and at bedtime.  . sertraline (ZOLOFT) 50 MG tablet Take 1 tablet (50 mg total) by mouth every morning. For Depression  . sodium chloride (OCEAN) 0.65 % SOLN nasal spray Place 1 spray into both nostrils every 6 (six)  hours as needed for congestion.  Marland Kitchen umeclidinium-vilanterol (ANORO ELLIPTA) 62.5-25 MCG/INH AEPB Inhale 1 puff into the lungs daily. Rinse mouth with water and spit after usage.  . [DISCONTINUED] Albuterol Sulfate 2.5 MG/0.5ML NEBU Inhale 2.5 mg into the lungs every 6 (six) hours as needed (wheezing).   . [DISCONTINUED] allopurinol (ZYLOPRIM) 300 MG tablet Take 300 mg by mouth daily.  . [DISCONTINUED] amLODipine (NORVASC) 5 MG tablet Take 5 mg by mouth daily.  . [DISCONTINUED] celecoxib (CELEBREX) 200 MG capsule Take 1 capsule by mouth daily.  . [DISCONTINUED] colchicine 0.6 MG tablet Take 0.6 mg by mouth 2 (two) times daily as needed (for Gout).   . [DISCONTINUED] donepezil (ARICEPT) 10 MG tablet Take 10 mg by mouth daily.  . [DISCONTINUED] ferrous sulfate 325 (65 FE) MG tablet Take 1 tablet (325 mg total) by mouth daily.  . [DISCONTINUED] gabapentin (NEURONTIN) 100 MG capsule Take 100 mg by mouth 3 (three) times daily.  . [DISCONTINUED] levETIRAcetam (KEPPRA) 500 MG tablet Take 500 mg by mouth 2 (two) times daily.  . [DISCONTINUED] LORazepam (ATIVAN) 1 MG tablet Take 1 mg by mouth at bedtime.  . [DISCONTINUED] melatonin 3 MG TABS tablet Take 3 mg by mouth at bedtime.  . [DISCONTINUED] Menthol, Topical Analgesic, (BIOFREEZE) 4 % GEL Apply 1 application topically in the morning, at noon, in the evening, and at bedtime. Left shoulder pain  . [DISCONTINUED] metoprolol succinate (TOPROL-XL) 25 MG 24 hr tablet Take 25 mg by mouth every morning.   . [DISCONTINUED] neomycin-bacitracin-polymyxin (NEOSPORIN) [phone] ointment Apply 1 application topically 4 (four) times daily as needed (For Breaking Skin).  . [  DISCONTINUED] omeprazole (PRILOSEC) 40 MG capsule Take 1 capsule (40 mg total) by mouth daily.  . [DISCONTINUED] omeprazole (PRILOSEC) 40 MG capsule Take 1 capsule (40 mg total) by mouth daily.  . [DISCONTINUED] predniSONE (DELTASONE) 5 MG tablet Take 5 mg by mouth daily with breakfast.  .  [DISCONTINUED] Saccharomyces boulardii (PROBIOTIC) 250 MG CAPS Take 250 mg by mouth daily.  . [DISCONTINUED] sennosides-docusate sodium (SENOKOT-S) 8.6-50 MG tablet Take 2 tablets by mouth in the morning and at bedtime.  . [DISCONTINUED] sertraline (ZOLOFT) 50 MG tablet Take 50 mg by mouth every morning. For Depression  . [DISCONTINUED] sodium chloride (OCEAN) 0.65 % SOLN nasal spray Place 1 spray into both nostrils every 6 (six) hours as needed for congestion.  . [DISCONTINUED] umeclidinium-vilanterol (ANORO ELLIPTA) 62.5-25 MCG/INH AEPB Inhale 1 puff into the lungs daily. Rinse mouth with water and spit after usage.  . [DISCONTINUED] LORazepam (ATIVAN) 0.5 MG tablet Take 1 tablet (0.5 mg total) by mouth at bedtime.   No facility-administered encounter medications on file as of 05/12/2020.     Review of Systems  Constitutional: Negative for appetite change, chills, fatigue and fever.  HENT: Negative for congestion, rhinorrhea, sinus pressure, sinus pain, sneezing and trouble swallowing.   Eyes: Negative for discharge, redness and itching.  Respiratory: Negative for cough, chest tightness, shortness of breath and wheezing.   Cardiovascular: Negative for chest pain, palpitations and leg swelling.  Gastrointestinal: Negative for abdominal distention, abdominal pain, constipation, diarrhea, nausea and vomiting.  Endocrine: Negative for cold intolerance, heat intolerance, polydipsia, polyphagia and polyuria.  Genitourinary: Negative for difficulty urinating, dysuria, frequency and urgency.  Musculoskeletal: Positive for arthralgias and gait problem. Negative for joint swelling and myalgias.  Skin: Negative for color change, pallor and rash.  Neurological: Negative for dizziness, speech difficulty, weakness, light-headedness and headaches.  Hematological: Does not bruise/bleed easily.  Psychiatric/Behavioral: Negative for agitation, behavioral problems and sleep disturbance. The patient is not  nervous/anxious.     Vitals:   05/12/20 1036  BP: 126/75  Pulse: 68  Resp: 17  Temp: 97.6 F (36.4 C)  SpO2: 96%  Weight: 97 lb 9.6 oz (44.3 kg)  Height: 5\' 2"  (1.575 m)   Body mass index is 17.85 kg/m. Physical Exam Vitals and nursing note reviewed.  Constitutional:      General: She is not in acute distress.    Appearance: She is underweight. She is not ill-appearing.  HENT:     Head: Normocephalic.     Nose: Nose normal. No congestion or rhinorrhea.     Mouth/Throat:     Mouth: Mucous membranes are moist.     Pharynx: Oropharynx is clear. No oropharyngeal exudate or posterior oropharyngeal erythema.  Eyes:     General: No scleral icterus.       Right eye: No discharge.        Left eye: No discharge.     Conjunctiva/sclera: Conjunctivae normal.     Pupils: Pupils are equal, round, and reactive to light.  Cardiovascular:     Rate and Rhythm: Normal rate and regular rhythm.     Pulses: Normal pulses.     Heart sounds: Murmur heard.  No friction rub. No gallop.   Pulmonary:     Effort: Pulmonary effort is normal. No respiratory distress.     Breath sounds: Normal breath sounds. No wheezing, rhonchi or rales.  Chest:     Chest wall: No tenderness.  Abdominal:     General: Bowel sounds are normal. There is  no distension.     Palpations: Abdomen is soft. There is no mass.     Tenderness: There is no abdominal tenderness. There is no right CVA tenderness, left CVA tenderness, guarding or rebound.  Musculoskeletal:        General: No swelling or tenderness.     Cervical back: Normal range of motion. No rigidity or tenderness.     Comments: Unsteady gait.bilateral lower extremities trace edema.  Left hand brace in place Fingers pink in color, warm and moves without any difficulties.   Lymphadenopathy:     Cervical: No cervical adenopathy.  Skin:    General: Skin is warm and dry.     Coloration: Skin is not pale.     Findings: No bruising, erythema or rash.    Neurological:     Mental Status: She is alert. Mental status is at baseline.     Cranial Nerves: No cranial nerve deficit.     Sensory: No sensory deficit.     Motor: No weakness.     Coordination: Coordination normal.     Gait: Gait abnormal.     Comments: Heard of hearing   Psychiatric:        Mood and Affect: Mood normal.        Speech: Speech normal.        Behavior: Behavior normal.        Thought Content: Thought content normal.    Labs reviewed: Basic Metabolic Panel: Recent Labs    03/31/20 0000 04/02/20 0000 04/23/20 1747  NA 131* 128* 136  K 5.4* 5.1 5.2*  CL 98* 96* 104  CO2 20 20 23   GLUCOSE  --   --  127*  BUN 15 20 25*  CREATININE 0.9 1.0 1.14*  CALCIUM 8.6* 9.1 9.3   Liver Function Tests: Recent Labs    04/23/20 1747  AST 18  ALT 10  ALKPHOS 71  BILITOT 0.3  PROT 5.6*  ALBUMIN 3.2*   CBC: Recent Labs    04/23/20 0000 04/23/20 1747 04/24/20 0952  WBC 6.9 6.9 7.0  HGB 8.3* 8.1* 8.3*  HCT 27* 28.0* 28.8*  MCV  --  88.3 87.0  PLT 329 318 287    Procedures and Imaging Studies During Stay: [facility] Chest Port 1 View  Result Date: 04/24/2020 CLINICAL DATA:  Peripheral edema. EXAM: PORTABLE CHEST 1 VIEW COMPARISON:  Chest x-ray 03/16/2020.  03/23/2018. FINDINGS: Mediastinum is stable. Prior CABG. Stable cardiomegaly. No pulmonary venous congestion. Prominent rounded pulmonary densities noted in the left mid lung have diminished in size. Ill-defined left suprahilar density noted. This could represent a new infiltrate or post treatment changes. COPD. Mild atelectasis left lung base. No pleural effusion or pneumothorax. No acute bony abnormality. IMPRESSION: 1. Prior CABG. Stable cardiomegaly. No pulmonary venous congestion. 2. Prominent rounded pulmonary densities noted in the left mid lung on chest x-ray of 03/16/2020 have diminished in size ill-defined left suprahilar density noted. This could represent a new infiltrate or post treatment changes. 3.  COPD.   Mild atelectasis left lung base. Electronically Signed   By: Marcello Moores  Register   On: 04/24/2020 10:25    Assessment/Plan:    1. Closed fracture of distal ends of left radius and ulna with routine healing, subsequent encounter - continue on PT/OT  - left hand brace  - celecoxib (CELEBREX) 200 MG capsule; Take 1 capsule (200 mg total) by mouth daily.  Dispense: 30 capsule; Refill: 0 - continue to follow up with Orthopedic at [facility]  Birdena Jubilee  PA-C as directed   2. Unsteady gait Has worked well with PT/ OT. Will discharge to [facility] with PT/OT to continue with ROM, Exercise, Gait stability and muscle strengthening. No new DME required. Fall and safety precautions.   3. Metastatic carcinoma involving lung with unknown primary site, unspecified laterality ([place]) - continue on comfort care on palliative will need Hospice consult at [facility]. - elected not to seek any treatment  - continue on oxygen via nasal cannula  4. Metastatic cancer to brain of unknown cell type ([place]) - comfort care as above.  5. COPD II Breathing stable. - continue on Albuterol,Anoro Ellipta  - continue on oxygen via nasal cannula - Albuterol Sulfate 2.5 MG/0.5ML NEBU; Inhale 0.5 mLs (2.5 mg total) into the lungs every 6 (six) hours as needed (wheezing).  Dispense: 20 mL; Refill: 0 - umeclidinium-vilanterol (ANORO ELLIPTA) 62.5-25 MCG/INH AEPB; Inhale 1 puff into the lungs daily. Rinse mouth with water and spit after usage.  Dispense: 30 each; Refill: 0  6. Essential hypertension B/p stable. - continue on A,mlodipine andmetoprolol - amLODipine (NORVASC) 5 MG tablet; Take 1 tablet (5 mg total) by mouth daily.  Dispense: 30 tablet; Refill: 0 - metoprolol succinate (TOPROL-XL) 25 MG 24 hr tablet; Take 1 tablet (25 mg total) by mouth every morning.  Dispense: 30 tablet; Refill: 0 - CBC, BMP in 1-2 weeks PCP  7. Gastroesophageal reflux disease without esophagitis Hgb stable 8.3 had dark positive stool  on recent ED visit. No further workup desired.  - continue omeprazole  - omeprazole (PRILOSEC) 40 MG capsule; Take 1 capsule (40 mg total) by mouth daily.  Dispense: 30 capsule; Refill: 0  8. Late onset Alzheimer's disease without behavioral disturbance ([place]) No new behavioral issues reported  - continue on Aricept and sertraline  - donepezil (ARICEPT) 10 MG tablet; Take 1 tablet (10 mg total) by mouth daily.  Dispense: 30 tablet; Refill: 0 - sertraline (ZOLOFT) 50 MG tablet; Take 1 tablet (50 mg total) by mouth every morning. For Depression  Dispense: 30 tablet; Refill: 0  9. Acquired hemolytic anemia (HCC) Hgb 8.3 evaluated recently in Ed as above.No further work up desired.  - continue Ferrous sulfates supplement. - ferrous sulfate 325 (65 FE) MG tablet; Take 1 tablet (325 mg total) by mouth daily.  Dispense: 30 tablet; Refill: 0 -  CBC in 1-2 weeks PCP   10. Insomnia, unspecified type Continue on melatonin  - melatonin 3 MG TABS tablet; Take 1 tablet (3 mg total) by mouth at bedtime.  Dispense: 30 tablet; Refill: 0  11. Generalized anxiety disorder Stable. - Continue on lorazepam  - LORazepam (ATIVAN) 1 MG tablet; Take 1 tablet (1 mg total) by mouth at bedtime.  Dispense: 30 tablet; Refill: 0 - sertraline (ZOLOFT) 50 MG tablet; Take 1 tablet (50 mg total) by mouth every morning. For Depression  Dispense: 30 tablet; Refill: 0  12. Radicular pain in left arm Pain under controlled. - celecoxib (CELEBREX) 200 MG capsule; Take 1 capsule (200 mg total) by mouth daily.  Dispense: 30 capsule; Refill: 0 - gabapentin (NEURONTIN) 100 MG capsule; Take 1 capsule (100 mg total) by mouth 3 (three) times daily.  Dispense: 90 capsule; Refill: 0  Patient is being discharged with the following home health services:   -PT/OT for ROM, exercise, gait stability and muscle strengthening -  HH RN     Patient is being discharged with the following durable medical equipment:   -  None has own walker and  wheelchair  Patient has been advised to f/u with their PCP in 1-2 weeks to for a transitions of care visit.Social services at their facility was responsible for arranging this appointment.  Pt was provided with adequate prescriptions of noncontrolled medications to reach the scheduled appointment.For controlled substances, a limited supply was provided as appropriate for the individual patient. If the pt normally receives these medications from a pain clinic or has a contract with another physician, these medications should be received from that clinic or physician only).    Future labs/tests needed:  CBC, BMP in 1-2 weeks PCP

## 2020-05-21 DIAGNOSIS — M25512 Pain in left shoulder: Secondary | ICD-10-CM | POA: Diagnosis not present

## 2020-05-21 DIAGNOSIS — Z9981 Dependence on supplemental oxygen: Secondary | ICD-10-CM | POA: Diagnosis not present

## 2020-05-21 DIAGNOSIS — G629 Polyneuropathy, unspecified: Secondary | ICD-10-CM | POA: Diagnosis not present

## 2020-05-21 DIAGNOSIS — D6489 Other specified anemias: Secondary | ICD-10-CM | POA: Diagnosis not present

## 2020-05-21 DIAGNOSIS — F0390 Unspecified dementia without behavioral disturbance: Secondary | ICD-10-CM | POA: Diagnosis not present

## 2020-05-21 DIAGNOSIS — R569 Unspecified convulsions: Secondary | ICD-10-CM | POA: Diagnosis not present

## 2020-05-21 DIAGNOSIS — K5909 Other constipation: Secondary | ICD-10-CM | POA: Diagnosis not present

## 2020-05-21 DIAGNOSIS — M109 Gout, unspecified: Secondary | ICD-10-CM | POA: Diagnosis not present

## 2020-05-21 DIAGNOSIS — I10 Essential (primary) hypertension: Secondary | ICD-10-CM | POA: Diagnosis not present

## 2020-05-21 DIAGNOSIS — Z79899 Other long term (current) drug therapy: Secondary | ICD-10-CM | POA: Diagnosis not present

## 2020-05-21 DIAGNOSIS — J449 Chronic obstructive pulmonary disease, unspecified: Secondary | ICD-10-CM | POA: Diagnosis not present

## 2020-05-28 DIAGNOSIS — R600 Localized edema: Secondary | ICD-10-CM | POA: Diagnosis not present

## 2020-05-28 DIAGNOSIS — F0390 Unspecified dementia without behavioral disturbance: Secondary | ICD-10-CM | POA: Diagnosis not present

## 2020-05-28 DIAGNOSIS — Z9981 Dependence on supplemental oxygen: Secondary | ICD-10-CM | POA: Diagnosis not present

## 2020-05-28 DIAGNOSIS — J449 Chronic obstructive pulmonary disease, unspecified: Secondary | ICD-10-CM | POA: Diagnosis not present

## 2020-05-28 DIAGNOSIS — Z79899 Other long term (current) drug therapy: Secondary | ICD-10-CM | POA: Diagnosis not present

## 2020-05-28 DIAGNOSIS — I10 Essential (primary) hypertension: Secondary | ICD-10-CM | POA: Diagnosis not present

## 2020-05-28 DIAGNOSIS — M25512 Pain in left shoulder: Secondary | ICD-10-CM | POA: Diagnosis not present

## 2020-06-04 DIAGNOSIS — F0390 Unspecified dementia without behavioral disturbance: Secondary | ICD-10-CM | POA: Diagnosis not present

## 2020-06-04 DIAGNOSIS — J449 Chronic obstructive pulmonary disease, unspecified: Secondary | ICD-10-CM | POA: Diagnosis not present

## 2020-06-04 DIAGNOSIS — M25512 Pain in left shoulder: Secondary | ICD-10-CM | POA: Diagnosis not present

## 2020-06-04 DIAGNOSIS — Z79899 Other long term (current) drug therapy: Secondary | ICD-10-CM | POA: Diagnosis not present

## 2020-06-04 DIAGNOSIS — I10 Essential (primary) hypertension: Secondary | ICD-10-CM | POA: Diagnosis not present

## 2020-06-04 DIAGNOSIS — Z9981 Dependence on supplemental oxygen: Secondary | ICD-10-CM | POA: Diagnosis not present

## 2020-06-16 DEATH — deceased
